# Patient Record
Sex: Female | Born: 1940 | Race: White | Hispanic: No | Smoking: Former smoker
Health system: Southern US, Community
[De-identification: ages and names within clinical notes are randomized; demographics above are authoritative.]

## PROBLEM LIST (undated history)

## (undated) DIAGNOSIS — G894 Chronic pain syndrome: Secondary | ICD-10-CM

## (undated) DIAGNOSIS — F0390 Unspecified dementia without behavioral disturbance: Secondary | ICD-10-CM

## (undated) DIAGNOSIS — I471 Supraventricular tachycardia: Secondary | ICD-10-CM

## (undated) DIAGNOSIS — E785 Hyperlipidemia, unspecified: Secondary | ICD-10-CM

## (undated) DIAGNOSIS — M549 Dorsalgia, unspecified: Secondary | ICD-10-CM

## (undated) DIAGNOSIS — Q233 Congenital mitral insufficiency: Secondary | ICD-10-CM

## (undated) DIAGNOSIS — E039 Hypothyroidism, unspecified: Secondary | ICD-10-CM

## (undated) DIAGNOSIS — G4730 Sleep apnea, unspecified: Secondary | ICD-10-CM

## (undated) DIAGNOSIS — I2510 Atherosclerotic heart disease of native coronary artery without angina pectoris: Secondary | ICD-10-CM

## (undated) DIAGNOSIS — M797 Fibromyalgia: Secondary | ICD-10-CM

## (undated) HISTORY — DX: Fibromyalgia: M79.7

## (undated) HISTORY — DX: Sleep apnea, unspecified: G47.30

## (undated) HISTORY — DX: Dorsalgia, unspecified: M54.9

## (undated) HISTORY — DX: Hypothyroidism, unspecified: E03.9

## (undated) HISTORY — PX: TUBAL LIGATION: SHX77

## (undated) HISTORY — DX: Hyperlipidemia, unspecified: E78.5

## (undated) HISTORY — DX: Atherosclerotic heart disease of native coronary artery without angina pectoris: I25.10

## (undated) HISTORY — DX: Chronic pain syndrome: G89.4

## (undated) HISTORY — DX: Morbid (severe) obesity due to excess calories: E66.01

## (undated) HISTORY — DX: Supraventricular tachycardia: I47.1

## (undated) HISTORY — DX: Congenital mitral insufficiency: Q23.3

## (undated) HISTORY — DX: Unspecified dementia, unspecified severity, without behavioral disturbance, psychotic disturbance, mood disturbance, and anxiety: F03.90

## (undated) HISTORY — PX: TOTAL ABDOMINAL HYSTERECTOMY: SHX209

## (undated) HISTORY — PX: APPENDECTOMY: SHX54

---

## 1998-02-20 ENCOUNTER — Ambulatory Visit (HOSPITAL_COMMUNITY): Admission: RE | Admit: 1998-02-20 | Discharge: 1998-02-20 | Payer: Self-pay

## 1999-09-08 ENCOUNTER — Other Ambulatory Visit: Admission: RE | Admit: 1999-09-08 | Discharge: 1999-09-08 | Payer: Self-pay | Admitting: Family Medicine

## 2000-03-04 ENCOUNTER — Ambulatory Visit (HOSPITAL_BASED_OUTPATIENT_CLINIC_OR_DEPARTMENT_OTHER): Admission: RE | Admit: 2000-03-04 | Discharge: 2000-03-04 | Payer: Self-pay | Admitting: Family Medicine

## 2000-08-09 ENCOUNTER — Encounter: Admission: RE | Admit: 2000-08-09 | Discharge: 2000-11-07 | Payer: Self-pay | Admitting: Family Medicine

## 2000-11-11 ENCOUNTER — Encounter: Admission: RE | Admit: 2000-11-11 | Discharge: 2001-02-09 | Payer: Self-pay | Admitting: Family Medicine

## 2001-04-18 ENCOUNTER — Emergency Department (HOSPITAL_COMMUNITY): Admission: EM | Admit: 2001-04-18 | Discharge: 2001-04-18 | Payer: Self-pay | Admitting: Emergency Medicine

## 2001-11-10 ENCOUNTER — Ambulatory Visit (HOSPITAL_COMMUNITY): Admission: RE | Admit: 2001-11-10 | Discharge: 2001-11-10 | Payer: Self-pay | Admitting: Cardiology

## 2002-08-04 ENCOUNTER — Ambulatory Visit (HOSPITAL_BASED_OUTPATIENT_CLINIC_OR_DEPARTMENT_OTHER): Admission: RE | Admit: 2002-08-04 | Discharge: 2002-08-04 | Payer: Self-pay | Admitting: *Deleted

## 2003-11-05 ENCOUNTER — Encounter: Admission: RE | Admit: 2003-11-05 | Discharge: 2003-12-12 | Payer: Self-pay | Admitting: Family Medicine

## 2004-08-21 ENCOUNTER — Encounter: Admission: RE | Admit: 2004-08-21 | Discharge: 2004-11-19 | Payer: Self-pay | Admitting: Family Medicine

## 2004-12-01 ENCOUNTER — Encounter: Admission: RE | Admit: 2004-12-01 | Discharge: 2005-03-01 | Payer: Self-pay | Admitting: Family Medicine

## 2005-02-26 ENCOUNTER — Encounter: Admission: RE | Admit: 2005-02-26 | Discharge: 2005-05-27 | Payer: Self-pay | Admitting: Family Medicine

## 2005-06-11 ENCOUNTER — Encounter: Admission: RE | Admit: 2005-06-11 | Discharge: 2005-07-12 | Payer: Self-pay | Admitting: Family Medicine

## 2005-07-23 ENCOUNTER — Encounter: Admission: RE | Admit: 2005-07-23 | Discharge: 2005-10-21 | Payer: Self-pay | Admitting: Family Medicine

## 2005-08-17 ENCOUNTER — Encounter
Admission: RE | Admit: 2005-08-17 | Discharge: 2005-08-17 | Payer: Self-pay | Admitting: Physical Medicine and Rehabilitation

## 2006-07-21 ENCOUNTER — Encounter: Admission: RE | Admit: 2006-07-21 | Discharge: 2006-07-21 | Payer: Self-pay | Admitting: Family Medicine

## 2006-08-18 ENCOUNTER — Ambulatory Visit: Payer: Self-pay | Admitting: Internal Medicine

## 2006-10-11 ENCOUNTER — Ambulatory Visit: Payer: Self-pay | Admitting: Internal Medicine

## 2006-10-11 ENCOUNTER — Ambulatory Visit: Payer: Self-pay | Admitting: Cardiovascular Disease

## 2006-12-21 ENCOUNTER — Emergency Department (HOSPITAL_COMMUNITY): Admission: EM | Admit: 2006-12-21 | Discharge: 2006-12-21 | Payer: Self-pay | Admitting: Emergency Medicine

## 2007-05-03 ENCOUNTER — Encounter: Admission: RE | Admit: 2007-05-03 | Discharge: 2007-05-03 | Payer: Self-pay | Admitting: Gastroenterology

## 2007-09-06 ENCOUNTER — Ambulatory Visit (HOSPITAL_BASED_OUTPATIENT_CLINIC_OR_DEPARTMENT_OTHER): Admission: RE | Admit: 2007-09-06 | Discharge: 2007-09-06 | Payer: Self-pay | Admitting: Urology

## 2008-04-12 ENCOUNTER — Ambulatory Visit: Payer: Self-pay | Admitting: Internal Medicine

## 2008-04-12 DIAGNOSIS — G2581 Restless legs syndrome: Secondary | ICD-10-CM | POA: Insufficient documentation

## 2008-04-12 DIAGNOSIS — G4710 Hypersomnia, unspecified: Secondary | ICD-10-CM | POA: Insufficient documentation

## 2008-04-16 ENCOUNTER — Telehealth (INDEPENDENT_AMBULATORY_CARE_PROVIDER_SITE_OTHER): Payer: Self-pay | Admitting: *Deleted

## 2008-04-22 DIAGNOSIS — E039 Hypothyroidism, unspecified: Secondary | ICD-10-CM

## 2008-04-22 DIAGNOSIS — J309 Allergic rhinitis, unspecified: Secondary | ICD-10-CM | POA: Insufficient documentation

## 2008-04-22 DIAGNOSIS — R05 Cough: Secondary | ICD-10-CM

## 2008-04-22 DIAGNOSIS — E119 Type 2 diabetes mellitus without complications: Secondary | ICD-10-CM

## 2008-04-22 DIAGNOSIS — G4730 Sleep apnea, unspecified: Secondary | ICD-10-CM | POA: Insufficient documentation

## 2008-05-03 ENCOUNTER — Telehealth (INDEPENDENT_AMBULATORY_CARE_PROVIDER_SITE_OTHER): Payer: Self-pay | Admitting: *Deleted

## 2008-05-07 ENCOUNTER — Encounter: Payer: Self-pay | Admitting: Internal Medicine

## 2008-05-09 ENCOUNTER — Telehealth (INDEPENDENT_AMBULATORY_CARE_PROVIDER_SITE_OTHER): Payer: Self-pay | Admitting: *Deleted

## 2008-05-22 ENCOUNTER — Ambulatory Visit: Payer: Self-pay | Admitting: Internal Medicine

## 2008-06-02 ENCOUNTER — Inpatient Hospital Stay (HOSPITAL_COMMUNITY): Admission: EM | Admit: 2008-06-02 | Discharge: 2008-06-05 | Payer: Self-pay | Admitting: Emergency Medicine

## 2008-06-02 ENCOUNTER — Ambulatory Visit: Payer: Self-pay | Admitting: Cardiology

## 2008-06-04 ENCOUNTER — Encounter (INDEPENDENT_AMBULATORY_CARE_PROVIDER_SITE_OTHER): Payer: Self-pay | Admitting: Internal Medicine

## 2008-06-05 ENCOUNTER — Telehealth: Payer: Self-pay | Admitting: Internal Medicine

## 2008-06-05 ENCOUNTER — Encounter: Payer: Self-pay | Admitting: Internal Medicine

## 2008-07-11 ENCOUNTER — Ambulatory Visit: Payer: Self-pay | Admitting: Cardiology

## 2008-10-16 ENCOUNTER — Telehealth (INDEPENDENT_AMBULATORY_CARE_PROVIDER_SITE_OTHER): Payer: Self-pay | Admitting: *Deleted

## 2010-04-07 ENCOUNTER — Ambulatory Visit: Payer: Self-pay | Admitting: Cardiology

## 2010-06-04 ENCOUNTER — Ambulatory Visit: Payer: Self-pay | Admitting: Cardiology

## 2010-06-04 DIAGNOSIS — I471 Supraventricular tachycardia, unspecified: Secondary | ICD-10-CM | POA: Insufficient documentation

## 2010-08-02 ENCOUNTER — Encounter: Payer: Self-pay | Admitting: Family Medicine

## 2010-08-03 ENCOUNTER — Encounter: Payer: Self-pay | Admitting: Gastroenterology

## 2010-08-12 NOTE — Assessment & Plan Note (Signed)
Summary: [facility]  Medications Added SOTALOL HCL 80 MG TABS (SOTALOL HCL) 1 1/2 by mouth two times a day COUMADIN 5 MG TABS (WARFARIN SODIUM) UAD LANTUS 100 UNIT/ML SOLN (INSULIN GLARGINE) UAD FLONASE 50 MCG/ACT SUSP (FLUTICASONE PROPIONATE) as directed FUROSEMIDE 40 MG TABS (FUROSEMIDE) 1 by mouth daily LEVOTHROID 75 MCG TABS (LEVOTHYROXINE SODIUM) 1 by mouth daily      Allergies Added:   Visit Type:  Follow-up Primary Provider:  Rudi Heap  CC:  SVT/Atrial Fib.  History of Present Illness: The patient presents for evaluation of the above. She is a complicated patient who saw Dr. Shirlee Latch in 2009. She was hospitalized and had a cardiac catheterization demonstrating nonobstructive coronary disease. At that time she did have a non-Q-wave MI felt to be related to supraventricular arrhythmia. Since that time she has been seen at [facility]. I reviewed these records. Discharge notes report atrial fibrillation and supraventricular tachycardia. They had considered an ablation but because of her morbid obesity decided to treat her with Betapace. Since that time the patient has reported no tachycardia palpitations. She was sent home on oxygen from January hospitalization 2011.  This was because she was found to have a resting oxygen saturations in the high 80s. Since that time she says she's had no followup on this.  She seems quite unhappy about being on sotalol and oxygen. She is on a walker. She is limited by severe morbid obesity. She does not describe palpitations, presyncope or syncope. She does not describe chest pressure, neck or arm discomfort. She does not describe PND or orthopnea. She has a history of sleep apnea but has not been using CPAP for years. She wonders if she still has sleep apnea. She has been upset over the left followup. However, when I review the previous schedule appointments I see that  in 2010 she canceled several appointments with Dr. Maple Hudson.  Current  Medications (verified): 1)  Cymbalta 60 Mg Cpep (Duloxetine Hcl) .... Take 1 By Mouth Once Daily 2)  Humalog 100 Unit/ml Soln (Insulin Lispro (Human)) .... Sliding Scale 3)  Klonopin 0.5 Mg Tabs (Clonazepam) .... Take 2 Tablets At Bedtime and As Needed During Day 4)  Hydrocodone-Acetaminophen 5-500 Mg Tabs (Hydrocodone-Acetaminophen) .... Take 1 By Mouth Every 6 Hours As Needed 5)  Pain Block Inejctions .... Dr. Ethelene Hal- Every 2-3 Months 6)  Vitamin D 1000 Unit Caps (Cholecalciferol) .... Take 1 By Mouth Once Daily 7)  Sotalol Hcl 80 Mg Tabs (Sotalol Hcl) .Marland Kitchen.. 1 1/2 By Mouth Two Times A Day 8)  Cpap   Advanced 9)  Coumadin 5 Mg Tabs (Warfarin Sodium) .... Uad 10)  Lantus 100 Unit/ml Soln (Insulin Glargine) .... Uad 11)  Flonase 50 Mcg/act Susp (Fluticasone Propionate) .... As Directed 12)  Furosemide 40 Mg Tabs (Furosemide) .Marland Kitchen.. 1 By Mouth Daily 13)  Levothroid 75 Mcg Tabs (Levothyroxine Sodium) .Marland Kitchen.. 1 By Mouth Daily  Allergies (verified): 1)  ! Pcn 2)  ! Codeine  Past History:  Past Medical History: Sleep Apnea Morbid obesity Allergic Rhinitis Diabetes, Type 2 back pain- injections -Dr/ Ramos Hypothyroidism CAD Hyperlipidemia Fibromyalgia Narcolepsy SVT/Atrial fibrillaiton MR-mild  Past Surgical History: Total Abdominal Hysterectomy Ttubal ligation Appendectomy  Review of Systems       As stated in the HPI and negative for all other systems.   Vital Signs:  Patient profile:   70 year old female Height:      64 inches Weight:      304 pounds BMI:  52.37 Pulse rate:   69 / minute Resp:     18 per minute BP sitting:   128 / 64  (right arm)  Vitals Entered By: Marrion Coy, CNA (June 04, 2010 2:13 PM)  Physical Exam  General:  obese.   Head:  normocephalic and atraumatic Eyes:  PERRLA/EOM intact; conjunctiva and lids normal. Neck:  No bruits, the patient is examined seated at 90 and I could not appreciate jugular venous distention or other  abnormalities but her morbid obesity compromises the exam Lungs:  Clear bilaterally to auscultation and percussion. Heart:  Distant heart sounds, S1 and S2 within normal limits, no obvious murmurs rubs or gallops Abdomen:  large pannus with normal bowel sounds, no rebound or guarding, it would be impossible to assess organomegaly no midline pulsatile mass or bruit Msk:  Back normal, normal gait. Muscle strength and tone normal. Extremities:  Chronic venous stasis changes, no edema, 2+ pulses Neurologic:  Alert and oriented x 3. Skin:  Intact without lesions or rashes. Cervical Nodes:  no significant adenopathy Psych:  depressed affect.     EKG  Procedure date:  06/04/2010  Findings:      sinus rhythm, rate 69, axis within normal limits, short PR interval, no acute ST-T wave changes  Impression & Recommendations:  Problem # 1:  SUPRAVENTRICULAR TACHYCARDIA (ICD-427.89) I reviewed extensively the records from [facility]. The patient was quite upset on sotalol. However, I spent quite a bit of time discussing with her the rationale and locking of his therapy and that he seems to be working. For now he'll change in therapy is indicated.  Problem # 2:  SLEEP APNEA (ICD-780.57) She had some distress wearing oxygen. All of oxygen ambulating her sats were in the 90s. However, I suggested that she should discuss this with Dr. Maple Hudson to see if she really needs this. We also discussed at length sleep apnea as fatigue is a significant complaint and she's not wearing CPAP. She doesn't think she has sleep apnea and a longer period I have ordered another sleep study and she agrees to follow up with Dr. Maple Hudson though she was previously non compliant with these appts. Orders: Sleep Disorder Referral (Sleep Disorder)  Problem # 3:  MORBID OBESITY (ICD-278.01) Certainly her major health problems morbid obesity. In one point there was some discussion apparently the bariatric surgery. I would encourage her  primary providers who must know her better to continue this conversation.  Patient Instructions: 1)  Your physician recommends that you schedule a follow-up appointment in: 1 YR WITH DR [facility] 2)  Your physician recommends that you continue on your current medications as directed. Please refer to the Current Medication list given to you today. 3)  Your physician has recommended that you have a sleep study.  This test records several body functions during sleep, including:  brain activity, eye movement, oxygen and carbon dioxide blood levels, heart rate and rhythm, breathing rate and rhythm, the flow of air through your mouth and nose, snoring, body muscle movements, and chest and belly movement.

## 2010-09-27 ENCOUNTER — Emergency Department (HOSPITAL_COMMUNITY)
Admission: EM | Admit: 2010-09-27 | Discharge: 2010-09-28 | Disposition: A | Discharge status: Home / Self Care | Payer: Medicare Other | Attending: Emergency Medicine | Admitting: Emergency Medicine

## 2010-09-27 DIAGNOSIS — J449 Chronic obstructive pulmonary disease, unspecified: Secondary | ICD-10-CM | POA: Insufficient documentation

## 2010-09-27 DIAGNOSIS — J4489 Other specified chronic obstructive pulmonary disease: Secondary | ICD-10-CM | POA: Insufficient documentation

## 2010-09-27 DIAGNOSIS — Z794 Long term (current) use of insulin: Secondary | ICD-10-CM | POA: Insufficient documentation

## 2010-09-27 DIAGNOSIS — L02219 Cutaneous abscess of trunk, unspecified: Secondary | ICD-10-CM | POA: Insufficient documentation

## 2010-09-27 DIAGNOSIS — I498 Other specified cardiac arrhythmias: Secondary | ICD-10-CM | POA: Insufficient documentation

## 2010-09-27 DIAGNOSIS — L03319 Cellulitis of trunk, unspecified: Secondary | ICD-10-CM | POA: Insufficient documentation

## 2010-09-27 DIAGNOSIS — I10 Essential (primary) hypertension: Secondary | ICD-10-CM | POA: Insufficient documentation

## 2010-09-27 DIAGNOSIS — B372 Candidiasis of skin and nail: Secondary | ICD-10-CM | POA: Insufficient documentation

## 2010-09-27 DIAGNOSIS — E119 Type 2 diabetes mellitus without complications: Secondary | ICD-10-CM | POA: Insufficient documentation

## 2010-09-27 DIAGNOSIS — D500 Iron deficiency anemia secondary to blood loss (chronic): Secondary | ICD-10-CM | POA: Insufficient documentation

## 2010-09-27 DIAGNOSIS — G4733 Obstructive sleep apnea (adult) (pediatric): Secondary | ICD-10-CM | POA: Insufficient documentation

## 2010-09-27 LAB — POCT CARDIAC MARKERS
Myoglobin, poc: 47.3 ng/mL (ref 12–200)
Troponin i, poc: <0.05 ng/mL (ref 0.00–0.09)

## 2010-09-27 LAB — POCT I-STAT, CHEM 8
BUN: 24 mg/dL — ABNORMAL HIGH (ref 6–23)
Chloride: 103 mEq/L (ref 96–112)
Glucose, Bld: 233 mg/dL — ABNORMAL HIGH (ref 70–99)
Potassium: 3.9 mEq/L (ref 3.5–5.1)

## 2010-09-27 LAB — PROTIME-INR: INR: 2.1 — ABNORMAL HIGH (ref 0.00–1.49)

## 2010-09-28 LAB — POCT CARDIAC MARKERS
Myoglobin, poc: 54.6 ng/mL (ref 12–200)
Troponin i, poc: <0.05 ng/mL (ref 0.00–0.09)

## 2010-10-01 LAB — CULTURE, ROUTINE-ABSCESS

## 2010-11-06 ENCOUNTER — Ambulatory Visit: Payer: Medicare Other | Admitting: Cardiology

## 2010-11-25 ENCOUNTER — Encounter: Payer: Self-pay | Admitting: Cardiology

## 2010-11-25 NOTE — Op Note (Signed)
NAMESIMRIT, GOHLKE             ACCOUNT NO.:  [id number]   MEDICAL RECORD NO.:  [id number]          PATIENT TYPE:  AMB   LOCATION:  [facility]                         FACILITY:  [facility]   PHYSICIAN:  Excell Seltzer. Annabell Howells, M.D.    DATE OF BIRTH:  01-05-1941   DATE OF PROCEDURE:  09/06/2007  DATE OF DISCHARGE:                               OPERATIVE REPORT   PROCEDURE:  Cystoscopy with Contigen implant.   PREOPERATIVE DIAGNOSIS:  Stress incontinence.   POSTOPERATIVE DIAGNOSIS:  Stress incontinence.   SURGEON:  Excell Seltzer. Annabell Howells, M.D.   ANESTHESIA:  MAC and local.   COMPLICATIONS:  None.   INDICATIONS:  Kynsley is a 70 year old morbidly obese white female with  stress incontinence who is going to have a collagen injection.   FINDINGS AND PROCEDURE:  She was taken to the operating room after  receiving Cipro.  She was placed on the table in the lithotomy position.  Light sedation was induced.  Her perineum and genitalia were prepped  with Betadine solution.  She was draped in the usual sterile fashion.  The 22-French scope was passed.  This was fitted with a 12 degrees lens  stabilization catheter and transurethral needle. Inspection of the  bladder did demonstrate follicular cystitis type changes which were  noted on her prior cystoscopy.  Ureteral orifices were unremarkable.  No  tumors were seen.  The urethra was somewhat patulous. The proximal  urethra was infiltrated with 1.5 mL of 1% lidocaine on each side.  This  was then followed by a submucosal implant of 2.5 mL of Contigen on each  side with excellent mucosal bulging and coaptation. The bladder was  drained with a 16-French red rubber catheter.  The patient was taken  down from the lithotomy position and was moved to the recovery room in  stable condition.  There were no complications.      Excell Seltzer. Annabell Howells, M.D.  Electronically Signed     JJW/MEDQ  D:  09/06/2007  T:  09/06/2007  Job:  [id number]   cc:   Ernestina Penna, M.D.  Fax:  [phone]

## 2010-11-25 NOTE — Consult Note (Signed)
[name], [name]             ACCOUNT NO.:  [id number]   MEDICAL RECORD NO.:  [id number]          PATIENT TYPE:  INP   LOCATION:  3728                         FACILITY:  [facility]   PHYSICIAN:  Madolyn Frieze. Jens Som, MD, FACCDATE OF BIRTH:  Aug 05, 1940   DATE OF CONSULTATION:  06/02/2008  DATE OF DISCHARGE:                                 CONSULTATION   [name] is a 70 year old female with past medical history of  morbid obesity, diabetes, hyperlipidemia, fibromyalgia, obstructive  sleep apnea, hypothyroidism, narcolepsy who I am asked to evaluate for  supraventricular tachycardia and chest pain.  The patient apparently had  seen Dr. Antoine Poche several years ago, but I did not have those records  available.  A cardiac catheterization was recommended, but she states I  check in doubt.  She is not able to do a significant amount of  activity at home.  This is because of back pain and obesity.  She does  walk from her bedroom to her kitchen but is otherwise confined to a  wheelchair.  She does have chronic dyspnea on exertion, but there is no  pedal edema.  There is no orthopnea or PND, although she has been  diagnosed with sleep apnea and narcolepsy.  She does not have exertional  chest pain.  At approximately 1 o'clock this morning, the patient was  lying on her side and developed sudden onset of palpitations.  This was  described as her heart racing.  She then developed a burning pain in  her chest that radiated to both upper extremities.  There was associated  shortness of breath, but there was no nausea, vomiting, or diaphoresis.  The pain was not pleuritic, positional, nor it was related to food.  They persisted and EMS was called.  An electrocardiogram was obtained  and revealed supraventricular ventricular tachycardia at a rate of 210.  She was given Cardizem and converted to sinus rhythm.  She is now  asymptomatic.  Cardiology is asked to further evaluate.   Her medications  include trazodone 150 mg p.o. nightly, Klonopin 0.5 mg,  Cymbalta, levothyroxine, lisinopril, Januvia, and insulin.  She was also  recently started on Ritalin approximately 5 weeks ago for her  narcolepsy.   She has an allergy to PENICILLIN and CODEINE.   SOCIAL HISTORY:  She does not smoke and has not for 40 years.  She does  not consume alcohol.  She has 3 children.   Her family history is significant for her father who apparently died of  myocardial infarction at age 60.   Her past medical history is significant for diabetes mellitus and  hyperlipidemia, but there is no hypertension.  She does have a history  of obstructive sleep apnea as well as narcolepsy.  There is a history of  COPD.  She also has a history of depression.  She has a history of  multiple abdominal surgeries including cholecystectomy, appendectomy,  and hysterectomy.  She has had previous surgery for what sounds to be a  pelvic inflammatory disease.  She has also had an ovarian cyst.  She has  had a  previous tonsillectomy.  She has also had bilateral carpal tunnel  surgery.  There is also a history of fibromyalgia by report.   REVIEW OF SYSTEMS:  She denies any headaches or fevers or chills.  She  has had a chronic cough that is nonproductive.  There is no hemoptysis.  There is no dysphagia, odynophagia, or melena.  She occasionally has  hematochezia that she attributes to a hemorrhoid.  There is no dysuria  or hematuria.  There are no rashes or seizure activity.  There is no  orthopnea, PND, and there is no pedal edema.  She does have chronic back  pain.  Remaining systems are negative.   Her physical examination today shows a blood pressure initially of  160/67 with a followup of 121/59.  Her temperature is 98.  Her pulse is  87.  She is well developed and morbidly obese.  She is in no acute  distress at present.  Her skin is warm and dry.  She does not appear to  be depressed.  There is no peripheral  clubbing.  Her back is unable to  be assessed.  Her HEENT is normal with normal eyelids.  Her neck is  supple with a normal upstroke bilaterally.  There are no bruits noted  and I cannot appreciate jugular venous distention, although the exam is  difficult.  Her chest shows diminished breath sounds throughout.  Her  cardiovascular exam reveals a regular rate and rhythm with a normal S1  and S2.  Her heart sounds are distant, but I can appreciate no murmurs,  rubs, or gallops.  Abdominal exam is significant for massive obesity.  She also is status post abdominal surgery.  There is no tenderness to  palpation nor she distended.  I cannot appreciate hepatosplenomegaly or  masses, but again this is a very difficult exam.  Her femoral pulses are  not palpated.  Her extremities show no edema and I can palpate no cords.  Her distal pulses are diminished.  Her neurologic exam is grossly  intact.   Her initial electrocardiogram showed SVT at a rate of 210 with  inferolateral ST depression.  A followup showed normal sinus rhythm with  nonspecific ST changes.   Her Troponin I is 0.09.  Her BUN and creatinine is 15 and 0.7  respectively with a hemoglobin of 14.3.   DIAGNOSES:  1. Supraventricular tachycardia - The patient has documented      supraventricular tachycardia with a rapid ventricular response.      She is now converted into sinus rhythm.  We will add low-dose beta-      blockade and increase as tolerated.  If she has recurrent episodes      in the future, then we can refer to our electrophysiologist for      consideration of ablation.  2. Chest pain - Her symptoms are concerning and she has multiple risk      factors including approximately 15 years of diabetes, strong family      history, and hypertension.  The risk and benefits of cardiac      catheterization have been discussed and she agrees to proceed.  We      would recommend adding an aspirin for now.  She also needs DVT       prophylaxis, Lovenox.  We will continue to cycle enzymes.  3. Morbid obesity.  4. Chronic back pain.  5. Diabetes mellitus - Managed per the Primary Care Service.  6. Hyperlipidemia.  7. Obstructive sleep apnea.  8. Hypothyroidism - We would recommend checking TSH.   Note, we would also recommend discontinuing her Ritalin.  We will be  happy to follow her while she is in the hospital.      Madolyn Frieze. Jens Som, MD, [facility]  Electronically Signed     BSC/MEDQ  D:  06/02/2008  T:  06/02/2008  Job:  [id number]

## 2010-11-25 NOTE — Assessment & Plan Note (Signed)
[name]                            CARDIOLOGY OFFICE NOTE   [place], [place]                    MRN:          [id number]  DATE:07/11/2008                            DOB:          February 12, 1941    PRIMARY CARE PHYSICIAN:  [place] Penna, MD, [facility].   PULMONOLOGIST:  [name]. Maple Hudson, MD, FCCP, FACP.   HISTORY OF PRESENT ILLNESS:  This is a 70 year old woman with a history  of coronary artery disease, hypertension, morbid obesity, diabetes, and  hyperlipidemia, who presents to Cardiology Clinic for followup after  recent hospitalization.  The patient was admitted on June 02, 2008,  with supraventricular tachycardia that was associated with chest burning  and discomfort.  Her supraventricular tachycardia spontaneously  converted to sinus rhythm and she had no recurrences while in the  hospital.  She did end up having a left heart catheterization, which  revealed moderate coronary artery disease.  We did stop her  methylphenidate which she was on for narcolepsy at the time of her  hospitalization given the episode of symptomatic SVT.  Since discharge,  the patient has been doing reasonably well.  She is wheelchair bound at  baseline.  She is able to walk from her bed to the kitchen; however, she  gets quite short of breath doing that.  This has been a pattern that has  been present for a long time.  She says she occasionally gets short of  breath first thing in the morning, when she gets up and sits on the side  of the bed, this will resolve after a few minutes and she is able to go  about her day.  She has had no further episodes of palpitations or heart  racing.  She has had no episodes of chest pain or burning since  discharge.  She has been on metoprolol twice a day to try to help  suppress the SVT.  Of note, her blood sugars have been running quite  high, they run greater than 200 in the morning.  She has  been seeing an  endocrinologist in [name] and finally, the patient states she had  a cough with ACE inhibitor in the past and she was actually discharged  from the hospital on lisinopril.  She states that she has had a mild  chronic cough come back.  She has been on valsartan in the past with no  problems.  Also, the patient is intolerant to multiple statins including  Lipitor, Zocor, and Crestor.  We started her on fluvastatin in the  hospital given her elevated LDL.  She states she has tolerated that  without problems.   PAST MEDICAL HISTORY:  1. Morbid obesity.  2. Diabetes.  3. Chronic low back pain.  4. Hyperlipidemia.  5. Obstructive sleep apnea.  The patient uses CPAP.  6. Fibromyalgia.  7. Hypothyroidism.  8. Narcolepsy.  The patient was on methylphenidate when she developed      her SVT.  We did stop that while she was in the hospital.  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  This was first manifested in      November 2009.  No recurrence so far.  10.Coronary artery disease.  The patient presented with unstable      angina in the setting of her SVT.  She had a left heart      catheterization in November 2009, that showed a 57% proximal      circumflex stenosis, 70% mid circumflex stenosis, 50% proximal LAD      stenosis, and EF on left ventriculogram was 65%.  It was decided to      medically manage the patient.  As his chest pain was thought to be      due to demand ischemia in the setting of fixed coronary stenosis      and fast heart rate.  11.Transthoracic echocardiogram in November 2009, this showed EF of 60-      65% with mild left atrial enlargement and mild mitral      regurgitation.   Most recent labs in November 2009, LDL 171, triglyceride 160, and HDL  24.  Creatinine 0.6.   SOCIAL HISTORY:  The patient lives in [name]  She quit smoking  40 years ago.  She does not drink alcohol.  She is married.  She has 3  children.   FAMILY HISTORY:  The  patient's father had an MI at the age of 78.   REVIEW OF SYSTEMS:  Negative except as noted in the history of present  illness.   ALLERGIES:  PENICILLIN and CODEINE intolerances.  STATINS included  LIPITOR, ZOCOR, and CRESTOR have all made the patient feel weak.   PHYSICAL EXAMINATION:  VITAL SIGNS:  Blood pressure 115/66 and heart  rate is 97 and regular.  Weight is 326 pounds.  GENERAL:  This is obese female in no apparent distress.  NEUROLOGIC:  Alert and oriented x3.  Normal affect.  NECK:  There is no JVD.  There is no thyromegaly or thyroid nodule.  CARDIOVASCULAR:  Heart regular.  S1 and S2.  No S3.  No S4.  No murmur.  There is no peripheral edema.  There are 2+ posterior tibial pulses  bilaterally.  There is no carotid bruit.  LUNGS:  Clear to auscultation bilaterally with normal respiratory  effort.  ABDOMEN:  Significant obesity.  Soft and nontender.  Bowel sounds are  present.  SKIN:  Normal.  HEENT:  Normal.  MUSCULOSKELETAL:  Normal.  EXTREMITIES:  No clubbing or cyanosis.   ASSESSMENT AND PLAN:  This is a 70 year old with a history of coronary  artery disease, diabetes, hyperlipidemia, and morbid obesity who  presents to Cardiology Clinic for followup of hospitalization.  1. Coronary artery disease.  The patient was noted to have moderate      coronary artery disease.  While she was in the hospital, she had an      episode of chest pain in the setting of SVT, this is likely demand      ischemia.  We will continue to medically manage this.  She has had      no further chest pain.  She is on aspirin, beta-blocker, statin,      and ACE inhibitor.  2. Supraventricular tachycardia.  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      This was associated with the use of methylphenidate.  We have      stopped the methylphenidate.  The patient has felt no recurrences      since discharge from the hospital.  She  is on beta-blocker.  If she      were to have a recurrence, we could  consider an AVNRT ablation.  3. Hyperlipidemia.  The patient's cholesterol is quite high with an      LDL of 171 and HDL cholesterol is low at 24.  She has been      intolerant on multiple statins.  We did start her on fluvastatin 40      mg in the hospital.  We are going to increase it to 80 mg today.      We will check her lipids in about 2-3 months.  I did want her to be      on Zetia also; however, she does not want to take that.  We did      talk a little about Niaspan, but I would be a little bit worried      about that while her glucose is still inadequately controlled.      Once her sugar gets under better control, we could consider adding      Niaspan as she is far from her goal for both LDL and HDL      cholesterol in the setting of quite significant coronary artery      disease.  4. Diabetes.  The patient's diabetes continued to be poorly controlled      with her sugar greater than 200.  She is followed up by an      endocrinologist in [place] who has been titrating her      diabetic regimen.  5. We did talk in length to the patient about attempting to loose some      weight.  She has been considering lap-band surgery, which I think      would be quite a reasonable thing for her to do.  6. If she does have a history of ACE inhibitor cough, we will      discontinue her lisinopril today and have her on valsartan 40 mg      b.i.d. instead.     [name], MD  Electronically Signed    DM/MedQ  DD: 07/11/2008  DT: 07/12/2008  Job #: [id number]   cc:   Joni Fears D. Maple Hudson, MD, FCCP, FACP  [name], M.D.

## 2010-11-25 NOTE — H&P (Signed)
NAME:  [name], [name]             ACCOUNT NO.:  [id number]   MEDICAL RECORD NO.:  [id number]          PATIENT TYPE:  EMS   LOCATION:  MAJO                         FACILITY:  [facility]   PHYSICIAN:  Wilson Singer, M.D.DATE OF BIRTH:  1941-07-05   DATE OF ADMISSION:  06/02/2008  DATE OF DISCHARGE:                              HISTORY & PHYSICAL   HISTORY:  This is a 70 year old morbidly obese lady who at 1:30 a.m.  this morning was sitting at her home watching television and had a  sudden onset of burning, pushing chest discomfort that spread across the  whole of her chest and down both her arms.  The episode she believes  lasted approximately 30 minutes during which time she had called  emergency services and after they attended to her, she seemed to improve  with her symptoms.  When she presented to the emergency room, she was  apparently in a rhythm of atrial flutter and Cardizem bolus 25 mg was  given and then she was placed on a drip of Cardizem at 10 mg an hour.  When I saw her in the emergency room at 7:30 this morning, the Cardizem  drip has been discontinued and she appears to have reverted back a  normal sinus rhythm.  Her symptoms are no longer present.  She does  describe shortness of breath on exertion most days, but her mobility is  limited because of her morbid obesity.  She was started on  methylphenidate for narcolepsy approximately 5 weeks ago and she wonders  whether this is a cause of her symptoms.  She denies any cough, fever,  hemoptysis, or persistent chest pain at the present time.   PAST MEDICAL HISTORY:  1. Significant for diabetes type 2, insulin-dependent.  2. Hypertension.  3. Obstructive sleep apnea with narcolepsy.  4. Morbid obesity.  5. Depression.  6. COPD.  7. Degenerative joint disease.  8. Depression.  9. She also has hypothyroidism.   PAST SURGICAL HISTORY:  Significant for a cystoscopy with Contigen  implant in February 2009.   SOCIAL  HISTORY:  She has been married for 40 years.  She does not smoke  and does not drink alcohol.  She has been on disability since 1993.   FAMILY HISTORY:  Noncontributory.   ALLERGIES:  CODEINE and PENICILLIN.   CURRENT MEDICATIONS:  1. Trazodone 150 mg nightly.  2. Klonopin 0.5 mg daily.  3. Cymbalta 90 mg daily.  4. Levothyroxine 50 mcg daily.  5. Lisinopril 10 mg daily.  6. Januvia 100 mg daily.  7. Humalog insulin 75/25 mixture 50 units b.i.d.   REVIEW OF SYSTEMS:  Apart from the symptoms mentioned above, there are  no other symptoms referable to all systems reviewed.   PHYSICAL EXAMINATION:  GENERAL:  The patient is currently afebrile and  hemodynamically stable.  VITAL SIGNS:  Temperature 98.6, blood pressure 137/44, pulse 75-80 and  appears to be in sinus rhythm, and saturations 97%.  CARDIOVASCULAR:  Heart sounds are present and normal without any  murmurs.  RESPIRATORY:  Lungs fields are clear.  There are no pleural or  pericardial rub.  ABDOMEN:  Soft and nontender.  She is very obese and difficult to  palpate her abdomen.  NEUROLOGIC:  She is alert and oriented with no focal neurological signs.   INVESTIGATIONS:  Sodium 140, potassium 4.0, chloride 105, BUN 15,  creatinine 0.7, glucose 294, hemoglobin 14.3, and troponin first one is  less than 0.05.   IMPRESSION:  1. Chest pain, unclear etiology, need to rule out myocardial      infarction or ischemia.  2. Atrial flutter or atrial fibrillation with rapid ventricular      response, now appears to be in sinus rhythm.  3. Morbid obesity.  4. Type 2 diabetes mellitus and now insulin dependent.  5. Hypertension.  6. Sleep apnea with narcolepsy, recently started on methylphenidate 5      weeks ago.  7. Depression.  8. Chronic obstructive pulmonary disease.  9. Hypothyroidism.   PLAN:  1. Admit to telemetry.  2. Serial cardiac enzymes and ECG.  3. Check echocardiogram.  4. Check D-dimer.  5. Cardiology  consultation.  I have spoken with Dr. Olga Millers and      he will have his team to see the patient.   Further recommendations will depend on the patient's hospital progress.      Wilson Singer, M.D.  Electronically Signed     NCG/MEDQ  D:  06/02/2008  T:  06/02/2008  Job:  [id number]   cc:   Rollene Rotunda, MD, [facility]  Ernestina Penna, M.D.

## 2010-11-25 NOTE — Discharge Summary (Signed)
[name], LOVETT             ACCOUNT NO.:  [id number]   MEDICAL RECORD NO.:  [id number]          PATIENT TYPE:  INP   LOCATION:  3728                         FACILITY:  [facility]   PHYSICIAN:  Elliot Cousin, M.D.    DATE OF BIRTH:  1941-07-12   DATE OF ADMISSION:  06/02/2008  DATE OF DISCHARGE:  06/05/2008                               DISCHARGE SUMMARY   DISCHARGE DIAGNOSES:  1. Supraventricular tachycardia.  2. Moderate coronary artery disease with a discrete 50% proximal      lesion in the circumflex artery, discrete eccentric 70% mid lesion      in the circumflex artery, diffuse luminal irregularities with 50%      proximal lesion in the left anterior descending, diffuse luminal      irregularities with a 30% proximal lesion in the right coronary      artery, and ejection fraction 65% per cardiac catheterization by      Dr. Tonny Bollman on June 04, 2008.  3. Hypertension.  4. Type 2 diabetes mellitus.  5. Hyperlipidemia (the patient said she was intolerant to STATINS).  6. Hypothyroidism.  7. Morbid obesity.  8. Obstructive sleep apnea and narcolepsy.  (Methylphenidate has been      discontinued by cardiology).  9. E. coli urinary tract infection.   DISCHARGE MEDICATIONS:  1. Aspirin 81 mg daily.  2. Metoprolol 25 mg b.i.d.  3. Lisinopril 2.5 mg daily.  4. Zetia 10 mg daily.  5. Fluvastatin 40 mg q.h.s.  (The patient was willing to try this      statin).  6. Sublingual nitroglycerine every 5 minutes x3 as needed for chest      pain.  7. Darvocet-N 100 q.i.d. p.r.n.  8. Trazodone 150 mg two tablets nightly  9. Vitamin D 1000 international units daily.  10.Januvia 100 mg daily.  11.Clonazepam 0.5 mg t.i.d. p.r.n.  12.Vicodin 10/325 mg every 6 hours as needed for pain.  13.Omeprazole CR 40 mg daily.  14.Cymbalta 62 mg three tablets daily.  15.Levothyroxine 50 mcg daily.  16.Loperamide hydrochloride 2 mg p.r.n.  17.Humalog 75/25, 50 units subcu b.i.d.  18.Cipro 500 mg b.i.d for 7 days.  19.CPAP q.h.s.   DISCHARGE DISPOSITION:  The patient is being discharged to home in  improved and stable condition today.  She was advised to follow up with  cardiologist Dr. Shirlee Latch on June 15, 2008, at 11 o'clock a.m.  She was  also advised to follow up with Dr. Maple Hudson in 1-2 weeks and Dr. Christell Constant in 1-  2 weeks.   CONSULTATIONS:  1. [name] Som, MD, [facility]  2. [name]. Excell Seltzer, MD  3. [name], MD   PROCEDURES PERFORMED:  1. Cardiac catheterization by Dr. Excell Seltzer on June 04, 2008.  The      results are above.  2. CT scan of the chest (CT angiogram) on June 03, 2008.  The      results revealed no evidence of acute pulmonary embolism.  Mild      bilateral lower lung atelectasis versus scarring.  Mild      cardiomegaly.  3. A  2-D echocardiogram on June 04, 2008.  The results revealed an      ejection fraction estimated to be 60-65%, mild mitral valvular      regurgitation, and mildly dilated left atrium.   HISTORY OF PRESENT ILLNESS:  The patient is a 70 year old woman with a  past medical history significant for morbid obesity, obstructive sleep  apnea, hypertension, and type 2 diabetes mellitus.  She presented to the  emergency department on June 02, 2008, with a chief complaint of  burning and pushing-type chest pain across her entire chest.  The pain  radiated down both arms.  When she was evaluated in the emergency  department, she was noted to be tachycardiac with a heart rate reaching  a high of 210 beats per minute.  There was evidence of supraventricular  tachycardia.  She was given a bolus of Cardizem and subsequently started  on a Cardizem drip.  Shortly thereafter, she reverted to normal sinus  rhythm.  Her initial cardiac markers were negative.  She was admitted  for further evaluation and management.   For additional details, please see the dictated history and physical.   HOSPITAL COURSE:  1.  SUPRAVENTRICULAR TACHYCARDIA AND MODERATE CORONARY ARTERY DISEASE.      Once the patient stabilized in the emergency department, she was      admitted to a telemetry bed.  For further evaluation, cardiac      enzymes were ordered as well as a D-dimer and TSH.  Her cardiac      enzymes were essentially negative.  Her troponin I did increase      modestly to 0.09 only once.  Her CK and CK-MB remained within      normal limits.  The followup troponin-I normalized to 0.04.  Her D-      dimer was elevated at 0.96.  Her TSH was within normal limits at      4.436.  Subsequently, a 2-D echocardiogram and CT scan of the chest      were ordered.  The CT scan of the chest was negative for PE.  Her 2-      D echocardiogram revealed preserved LV function with an ejection      fraction ranging from 60-65% and no significant valvular      abnormalities.  Her EKG on followup revealed sinus bradycardia with      a heart rate of 57 beats per minute and no acute abnormalities      otherwise.  Dr. Jens Som, cardiologist was consulted.  He added      Lopressor 25 mg b.i.d. for long-term treatment of supraventricular      tachycardia.  He recommended that the patient be further assessed      with a cardiac catheterization.  The patient agreed.  In addition,      a fasting lipid profile was ordered and it revealed a total      cholesterol of 227, triglycerides of 160, HDL of 24, and LDL of      171.  The patient stated that she had been tried on treatment with      a statin medication in the past; however, she could not tolerate it      because of muscle pain.  She was however willing to try Zetia and      fluvastatin upon discharge.  The patient stated that she may not be      able to afford Zetia, however.  After the patient agreed  to the      cardiac catheterization, it was performed by cardiologist Dr.      Tonny Bollman  on June 04, 2008.  The results are indicated      above.  The dose of lisinopril  was decreased to 2.5 mg daily after      the addition of metoprolol.  Dr. Shirlee Latch who saw the patient in      followup, recommended that the methylphenidate be discontinued      because of underlying coronary artery disease and SVT.  The patient      understood.  She was advised to follow up with Dr. Maple Hudson for      further management of her obstructive sleep apnea and narcolepsy.  2. HYPOTHYROIDISM.  The patient was maintained on Levoxyl during the      hospitalization.  Her TSH was within normal limits at 4.4.  3. TYPE 2 DIABETES MELLITUS.  The patient was started on sliding scale      NovoLog q.a.c., q.h.s., as well as NPH b.i.d. to stimulate her      usual home dose of 75/25 insulin.  Her hemoglobin A1c was 7.9.      Upon discharge, she was advised to restart 75/25 insulin as      previously prescribed.  Further management will be deferred to her      primary care physician Dr. Christell Constant.  4. OBSTRUCTIVE SLEEP APNEA WITH NARCOLEPSY.  The patient is treated      chronically with CPAP and previously with methylphenidate.  As      indicated above, methylphenidate has been discontinued because of      her underlying coronary artery disease and SVT.  5. ESCHERICHIA COLI URINARY TRACT INFECTION.  The patient did not have      any overt complaint of painful urination.  However, she did state      that the Foley catheter was uncomfortable.  A urinalysis and      culture were ordered.  The culture grew out greater than 100,000      colonies of E-coli.  Upon discharge, the patient was started on      Cipro for a total of 7 days.  The Foley catheter was discontinued      prior to hospital discharge.      Elliot Cousin, M.D.  Electronically Signed     DF/MEDQ  D:  06/05/2008  T:  06/05/2008  Job:  [id number]   cc:   Ernestina Penna, M.D.  Clinton D. Maple Hudson, MD, FCCP, [facility] P[facility]. Excell Seltzer, MD  [name], MD

## 2010-11-26 ENCOUNTER — Ambulatory Visit (INDEPENDENT_AMBULATORY_CARE_PROVIDER_SITE_OTHER): Payer: Medicare Other | Admitting: Cardiology

## 2010-11-26 ENCOUNTER — Encounter: Payer: Self-pay | Admitting: Cardiology

## 2010-11-26 DIAGNOSIS — I498 Other specified cardiac arrhythmias: Secondary | ICD-10-CM

## 2010-11-26 DIAGNOSIS — R0989 Other specified symptoms and signs involving the circulatory and respiratory systems: Secondary | ICD-10-CM

## 2010-11-26 DIAGNOSIS — R0600 Dyspnea, unspecified: Secondary | ICD-10-CM

## 2010-11-26 MED ORDER — POTASSIUM CHLORIDE ER 10 MEQ PO TBCR: 10.0000 meq | EXTENDED_RELEASE_TABLET | ORAL | Status: DC

## 2010-11-26 NOTE — Progress Notes (Signed)
HPI The patient presents for evaluation of dyspnea.  She has a history of SVT.  Since I last saw her she has continued to have dyspnea.  She reports that when she wakes up in the morning she short of breath. She's not been taking her Lasix as she has incontinence and she is confined to the house and she takes this. She should be down because she can't sit in a recliner. She has had her CPAP adjusted and this seems to be working better. She continues to have a very depressed affect. She is not describing any chest pressure, neck or arm discomfort. She does have some continued mild lower extremity swelling which is actually minimal given her morbid obesity.  Allergies  Allergen Reactions  . Codeine     REACTION: hyper  . Penicillins     REACTION: itch    Current Outpatient Prescriptions  Medication Sig Dispense Refill  . Cholecalciferol (VITAMIN D-3 PO) Take by mouth daily.        . DULoxetine HCl (CYMBALTA PO) Take by mouth. Take  1 tab daily       . escitalopram (LEXAPRO) 20 MG tablet Take 20 mg by mouth daily.        . fluticasone (FLONASE) 50 MCG/ACT nasal spray 2 sprays by Nasal route daily.        . furosemide (LASIX) 40 MG tablet 1 tab daily       . HYDROcodone-acetaminophen (NORCO) 10-325 MG per tablet Take 1 tablet by mouth every 6 (six) hours as needed.        . insulin aspart (NOVOLOG) 100 UNIT/ML injection Inject into the skin 3 (three) times daily before meals.        . insulin glargine (LANTUS) 100 UNIT/ML injection Inject into the skin at bedtime.        Marland Kitchen levothyroxine (SYNTHROID, LEVOTHROID) 75 MCG tablet Take 75 mcg by mouth daily.        . sotalol (BETAPACE) 80 MG tablet Take 1 1/2 half tab twice a day       . warfarin (COUMADIN) 5 MG tablet Take 5 mg by mouth daily.        Marland Kitchen DISCONTD: clonazePAM (KLONOPIN) 0.5 MG tablet Take 0.5 mg by mouth 2 (two) times daily as needed.        Marland Kitchen DISCONTD: insulin lispro (HUMALOG) 100 UNIT/ML injection Inject into the skin 3 (three) times  daily before meals.        Marland Kitchen DISCONTD: levothyroxine (SYNTHROID, LEVOTHROID) 100 MCG tablet Take 100 mcg by mouth daily.         Past Medical History  Diagnosis Date  . Sleep apnea   . Morbid obesity   . Allergic rhinitis   . Diabetes mellitus     type 2  . Back pain   . Hypothyroidism   . CAD (coronary artery disease)   . Hyperlipemia   . Fibromyalgia   . Narcolepsy   . SVT (supraventricular tachycardia)     afib  . 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 Total abdominal hysterectomy   . Tubal ligation   . Appendectomy     ROS:  As stated in the HPI and negative for all other systems.  PHYSICAL EXAM BP 126/60  Pulse 69  Resp 16  Ht 5\' 4"  (1.626 m)  Wt 329 lb (149.233 kg)  BMI 56.47 kg/m2 General:  obese.  Head:  normocephalic and atraumatic Eyes:  PERRLA/EOM intact; conjunctiva and lids normal. Neck:  No bruits, the patient is examined seated at 90 and I could not appreciate jugular venous distention or other abnormalities but her morbid obesity compromises the exam Lungs:  Clear bilaterally to auscultation and percussion. Heart:  Distant heart sounds, S1 and S2 within normal limits, no obvious murmurs rubs or gallops Abdomen:  large pannus with normal bowel sounds, no rebound or guarding, it would be impossible to assess organomegaly no midline pulsatile mass or bruit Msk:  Back normal, normal gait. Muscle strength and tone normal. Extremities:  Chronic venous stasis changes, no edema, 2+ pulses Neurologic:  Alert and oriented x 3. Skin:  Intact without lesions or rashes. Cervical Nodes:  no significant adenopathy Psych:  depressed affect.    EKG:  Sinus rhythm, rate 69, axis within normal limits, intervals within normal limits, no acute ST-T wave changes.   ASSESSMENT AND PLAN

## 2010-11-26 NOTE — Assessment & Plan Note (Signed)
This is a problem that is contributing to many symptoms. She has discussed bariatric surgery in the past and I would encourage consideration of this significant step

## 2010-11-26 NOTE — Assessment & Plan Note (Signed)
I suspect this is multifactorial. There could be some element of diastolic dysfunction. I will check a BNP level. I have asked her to take her Lasix for the next 3 days and I will also give her 10 mEq potassium each day. She is to try to keep her feet elevated. We spent a long time discussing salt restriction which she does not do.

## 2010-11-26 NOTE — Assessment & Plan Note (Signed)
She has had no further symptomatic supraventricular arrhythmias.

## 2010-11-26 NOTE — Patient Instructions (Addendum)
Take Furosemide 40 mg a day for 3 days Take Potassium Chloride 10 mEq daily with Fourosemide Have B Nat Peptide  (blodd work drawn) 11/27/2010 Follow up with Dr Antoine Poche in 2 months in [place]

## 2010-11-28 NOTE — Assessment & Plan Note (Signed)
[facility]                             PULMONARY OFFICE NOTE   [name], [name]                    MRN:          [id number]  DATE:08/18/2006                            DOB:          01-Jan-1941    REASON FOR CONSULTATION:  Bad cough.   HISTORY:  This is a 70 year old white female, remote smoker, with a year  and a half history of a dry cough that is present all day long, but  worse immediately on lying down and has not responded yet to Singulair  or cough medications.  She was not specific about what cough medicines  have been tried.  She denies any overt sinus or reflux symptoms, fevers,  chills, sweats, orthopnea, PND, or leg swelling.  She does not remember  at the onset of the cough whether there was any obvious triggering  factor, but denies any triggering factors now except the supine  position.   She does have a history since childhood of itching, sneezing in the  Spring and the Fall with stuffy nose but states that this does not  correlate with the cough.   PAST MEDICAL HISTORY:  Significant for morbid obesity complicated by  diabetes, sleep apnea (she said she had it before but does not have it  now since she lost 70 pounds), cholecystectomy, hysterectomy, and  appendectomy.   ALLERGIES:  NONE KNOWN.   MEDICATIONS:  Trazodone, oxybutynin, and insulin injections, as well as  multiple p.r.n. pain medicines and anxiolytics.   SOCIAL HISTORY:  She quit smoking 35 years ago, is on disability.  No  unusual travel, pet, or hobby exposure.   FAMILY HISTORY:  Was reviewed in detail and significant for the absence  of respiratory disease or atopy.   REVIEW OF SYSTEMS:  Also taken in detail on worksheet, negative except  as outlined above.   PHYSICAL EXAMINATION:  Pleasant, ambulatory, massively obese white  female in no acute distress who nevertheless can get up on exam table  with minimum assistance.  She is afebrile, stable vital  signs.  HEENT:  Significant for moderate turbinate edema, oropharynx is clear  with no excessive postnasal drainage or cobblestoning.  Dentition is  intact.  NECK:  Supple without cervical adenopathy or tenderness, trachea is  midline, no thyromegaly.  LUNGS:  Fields perfectly clear bilaterally to auscultation and  percussion with no cough listed on inspiratory or expiratory maneuvers.  There is a regular rhythm without murmur, gallop, or rub present.  ABDOMEN:  Obese, otherwise benign.  EXTREMITIES:  Warm without calf tenderness, cyanosis, clubbing, or  edema.   Chest x-ray was reviewed from July 21, 2006, is unremarkable.   IMPRESSION:  Chronic cough dating back over a year that is worse when  she lies down at night.  It is strongly suggestive of either reflux or  sinus disease.  I suspect there is also a cyclical component with the  cough at this point given its clonicity.   I therefore spent extra time going over a cyclical cough regimen with  the patient which includes the use of Delsym plus/minus tramadol  the  patient suppressive excessive coughing, Zegerid 40 mg nightly to  suppress reflux, and 6 day course of prednisone to address upper airway  inflammation associated with chronic coughing.   I emphasized to her the avoidance of mint and menthol containing  lozenges and reviewed the diet with her as well.   If not improved within 2 weeks in terms of the cough control I am going  to recommend she proceed with a sinus CT scan before she returns here at  2 weeks.     Charlaine Dalton. Sherene Sires, MD, [facility]  Electronically Signed    MBW/MedQ  DD: 08/19/2006  DT: 08/19/2006  Job #: [id number]   cc:   Ernestina Penna, M.D.

## 2010-11-28 NOTE — Assessment & Plan Note (Signed)
[facility]                             PULMONARY OFFICE NOTE   [name], [name]                    MRN:          [id number]  DATE:10/11/2006                            DOB:          1941/04/05    HISTORY:  This 70 year old, white female, remote smoker, with a year-and-  a-half history of cough worse immediately upon lying down and had failed  to respond to Singular cough medications when I saw her on 08/18/2006.  I started on Zegerid at that point with a six-day course of prednisone  for any inflammatory component that might be present, and she says she  is better but not completely well at this point.  It turns out she ran  out of the Zegerid several weeks ago.  She had been instructed on diet,  but says she has misplaced the sheet she received.  She was also  supposed to call for a sinus CT scan if not 100% satisfied and has  failed to do so.   PHYSICAL EXAMINATION:  GENERAL:  She is a depressed-appearing, obese,  ambulatory, white female in no acute distress.  VITAL SIGNS:  Weight is 322 pounds, this is up another 3 pounds from  previous visit, with a blood pressure of 160/90.  HEENT:  Remarkable for nonspecific turbinate edema with minimal cyanosis  or pallor.  Oropharynx reveals no obvious polyps.  NECK:  Supple without cervical adenopathy or tenderness.  Trachea is  midline.  LUNGS:  Lung fields perfectly clear bilaterally to auscultation and  percussion with no cough elicited on his inspiratory or expiratory  maneuvers.  HEART:  There is a regular rhythm without murmur, gallop, or rub.  ABDOMEN:  Soft and benign.  EXTREMITIES:  Warm without calf tenderness, cyanosis, clubbing, or  edema.   IMPRESSION:  1. Chronic cough that is at least partially responsive to Zegerid and      actually in retrospect did better with Zegerid than she has with      anything prior to it.  Nevertheless, she is concerned that there is      something  draining in her throat that's not reflux related and      wants an allergy workup.  Before we embark in this direction, I      would like to make sure first of all that she understands how to      use a gastroesophageal reflux disease diet (I gave her a second      sheet today and reviewed it with her), and also asked her to take      Zegerid for a full six weeks and at the end of six weeks stop it      abruptly to see if any of her symptoms return.  If so, it may be      that she does have postnasal drainage but that at least part of it      is either related to reflux or it is enough to trigger throat      clearing, which in turn triggers reflux on a secondary basis.  2. I  do note that her blood pressure is borderline elevated, probably      related to her obesity, and that Zegerid may not be the best choice      for her so we will defer long-term management  of the choice of      acid reflux medication (assuming that it is effective) to Dr.      Kathi Der capable hands in the context of doing routine monitors of      blood pressure.  3. I also note that the patient's weight has continued to climb, which      certainly cannot be blamed on Zegerid since she has not been on it      for several weeks and that that progressive weight gain places her      at risk of developing poorly controlled reflux, but also other      secondary complications of obesity including restrictive pulmonary      physiology.  However, a pulmonary followup can be at this point      p.r.n.   At her request, I have arranged for her to be reevaluated by our Allergy  Department, and a sinus CT scan will be done as well.     Charlaine Dalton. Sherene Sires, MD, [facility]  Electronically Signed    MBW/MedQ  DD: 10/11/2006  DT: 10/11/2006  Job #: [id number]   cc:   Ernestina Penna, M.D.

## 2011-01-15 ENCOUNTER — Ambulatory Visit: Payer: Medicare Other | Admitting: Cardiology

## 2011-01-16 ENCOUNTER — Encounter: Payer: Self-pay | Admitting: Cardiology

## 2011-01-21 ENCOUNTER — Ambulatory Visit (INDEPENDENT_AMBULATORY_CARE_PROVIDER_SITE_OTHER): Payer: Medicare Other | Admitting: Cardiology

## 2011-01-21 ENCOUNTER — Encounter: Payer: Self-pay | Admitting: Cardiology

## 2011-01-21 DIAGNOSIS — I498 Other specified cardiac arrhythmias: Secondary | ICD-10-CM

## 2011-01-21 DIAGNOSIS — R0600 Dyspnea, unspecified: Secondary | ICD-10-CM

## 2011-01-21 DIAGNOSIS — R0989 Other specified symptoms and signs involving the circulatory and respiratory systems: Secondary | ICD-10-CM

## 2011-01-21 NOTE — Assessment & Plan Note (Signed)
This is baseline.  We again discussed PRN lasix dosing.

## 2011-01-21 NOTE — Progress Notes (Signed)
HPI The patient presents for evaluation of dyspnea.  She has a history of SVT.  Since I last saw she had an episode of SVT and was treated with adenosine at [facility]  She was last overnight. She did have some chest discomfort with this she reports it is mild. Since then she has had no further chest pressure, neck or arm discomfort. She has had no further palpitations, presyncope or syncope. She had some continued lower extremity swelling but this is actually less than previous. She continues to have O2 dependent dyspnea which is about the same as usual.  Allergies  Allergen Reactions  . Codeine     REACTION: hyper  . Penicillins     REACTION: itch    Current Outpatient Prescriptions  Medication Sig Dispense Refill  . aspirin 81 MG tablet Take 81 mg by mouth daily.        . Cholecalciferol (VITAMIN D-3 PO) Take by mouth daily.        . clonazePAM (KLONOPIN) 0.5 MG tablet Take 0.5 mg by mouth 2 (two) times daily as needed.        . docusate sodium (COLACE) 100 MG capsule Take 100 mg by mouth 2 (two) times daily.        Marland Kitchen escitalopram (LEXAPRO) 20 MG tablet Take 20 mg by mouth daily.        . fluticasone (FLONASE) 50 MCG/ACT nasal spray 2 sprays by Nasal route daily.        . furosemide (LASIX) 40 MG tablet 40 mg 2 (two) times daily. 1 tab daily      . HYDROcodone-acetaminophen (NORCO) 10-325 MG per tablet Take 1 tablet by mouth every 6 (six) hours as needed.        . insulin glargine (LANTUS) 100 UNIT/ML injection Inject into the skin at bedtime.        . Insulin Zinc Human (NOVOLIN L [place]) Inject into the skin as directed.        Marland Kitchen levothyroxine (SYNTHROID, LEVOTHROID) 75 MCG tablet Take 75 mcg by mouth daily.        . nitroGLYCERIN (NITROSTAT) 0.4 MG SL tablet Place 0.4 mg under the tongue every 5 (five) minutes as needed.        . nystatin (MYCOSTATIN) powder Apply topically as directed.        . ondansetron (ZOFRAN) 4 MG tablet Take 4 mg by mouth every 8 (eight) hours as needed.        .  potassium chloride SA (K-DUR,KLOR-CON) 20 MEQ tablet Take 20 mEq by mouth daily.        . Sennosides (SENOKOT PO) Take by mouth daily.        . SERTRALINE HCL PO Take by mouth daily.        . sotalol (BETAPACE) 80 MG tablet Take 1 1/2 half tab twice a day       . traZODone (DESYREL) 150 MG tablet Take 150 mg by mouth at bedtime.        Marland Kitchen warfarin (COUMADIN) 5 MG tablet Take 5 mg by mouth daily.          Past Medical History  Diagnosis Date  . Sleep apnea   . Morbid obesity   . Allergic rhinitis   . Diabetes mellitus     type 2  . Back pain   . Hypothyroidism   . CAD (coronary artery disease)   . Hyperlipemia   . Fibromyalgia   . Narcolepsy   .  SVT (supraventricular tachycardia)     afib  . 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 Total abdominal hysterectomy   . Tubal ligation   . Appendectomy     ROS:  As stated in the HPI and negative for all other systems.  PHYSICAL EXAM BP 138/66  Pulse 62  Resp 18  Ht 5\' 4"  (1.626 m)  Wt 330 lb (149.687 kg)  BMI 56.64 kg/m2 General:  obese.   Head:  normocephalic and atraumatic Eyes:  PERRLA/EOM intact; conjunctiva and lids normal. Neck:  No bruits, the patient is examined seated at 90 and I could not appreciate jugular venous distention or other abnormalities but her morbid obesity compromises the exam Lungs:  Clear bilaterally to auscultation and percussion. Heart:  Distant heart sounds, S1 and S2 within normal limits, no obvious murmurs rubs or gallops Abdomen:  large pannus with normal bowel sounds, no rebound or guarding, it would be impossible to assess organomegaly no midline pulsatile mass or bruit Msk:  Back normal, normal gait. Muscle strength and tone normal. Extremities:  Chronic venous stasis changes, no edema, 2+ pulses Neurologic:  Alert and oriented x 3. Skin:  Intact without lesions or rashes. Cervical Nodes:  no significant adenopathy Psych:  depressed  affect.    ASSESSMENT AND PLAN

## 2011-01-21 NOTE — Assessment & Plan Note (Signed)
I will continue to encourage bariatric surgery.

## 2011-01-21 NOTE — Patient Instructions (Signed)
Please continue your current medications as listed See Dr Antoine Poche back in 6 months.

## 2011-01-21 NOTE — Assessment & Plan Note (Signed)
I reviewed extensively the vastus nodes. At the time of that presentation she was not taking her sotalol routinely. She is now. We discussed at length they'll maneuvers that might help area the plan at [facility] is that they will do another ablation if she has another episode of this were to happen infrequently. She understands that she would call 911. I would change therapy at this point.

## 2011-04-03 LAB — I-STAT 8, (EC8 V) (CONVERTED LAB)
BUN: 20
Bicarbonate: 26.2 — ABNORMAL HIGH
Hemoglobin: 14.3
Operator id: 114531
pH, Ven: 7.373 — ABNORMAL HIGH

## 2011-04-15 LAB — COMPREHENSIVE METABOLIC PANEL
ALT: 19
Alkaline Phosphatase: 78
CO2: 31
Chloride: 101
GFR calc non Af Amer: >60
Glucose, Bld: 176 — ABNORMAL HIGH
Potassium: 4.1
Sodium: 137
Total Bilirubin: 0.5
Total Protein: 6

## 2011-04-15 LAB — URINE MICROSCOPIC-ADD ON

## 2011-04-15 LAB — D-DIMER, QUANTITATIVE
D-Dimer, Quant: 0.96 — ABNORMAL HIGH
D-Dimer, Quant: 1.03 — ABNORMAL HIGH

## 2011-04-15 LAB — URINALYSIS, ROUTINE W REFLEX MICROSCOPIC
Glucose, UA: >1000 — AB
Hgb urine dipstick: NEGATIVE
Leukocytes, UA: NEGATIVE
Protein, ur: NEGATIVE
Specific Gravity, Urine: 1.021
Urobilinogen, UA: 1

## 2011-04-15 LAB — GLUCOSE, CAPILLARY
Glucose-Capillary: 161 — ABNORMAL HIGH
Glucose-Capillary: 175 — ABNORMAL HIGH
Glucose-Capillary: 207 — ABNORMAL HIGH
Glucose-Capillary: 245 — ABNORMAL HIGH
Glucose-Capillary: 250 — ABNORMAL HIGH

## 2011-04-15 LAB — CARDIAC PANEL(CRET KIN+CKTOT+MB+TROPI)
CK, MB: 1.5
Relative Index: INVALID
Total CK: 58

## 2011-04-15 LAB — LIPID PANEL
Cholesterol: 227 — ABNORMAL HIGH
Total CHOL/HDL Ratio: 9.5

## 2011-04-15 LAB — CBC
HCT: 38.5
HCT: 40.2
Hemoglobin: 13.2
MCV: 95.9
RBC: 4.01
RBC: 4.17
WBC: 8.5
WBC: 8.8

## 2011-04-15 LAB — URINE CULTURE

## 2011-04-15 LAB — CK TOTAL AND CKMB (NOT AT ARMC)
CK, MB: 1.9
Total CK: 57

## 2011-04-15 LAB — BASIC METABOLIC PANEL
BUN: 11
Chloride: 97
Potassium: 4.6

## 2011-04-15 LAB — POCT I-STAT, CHEM 8
BUN: 15
Chloride: 105
HCT: 42

## 2011-04-15 LAB — B-NATRIURETIC PEPTIDE (CONVERTED LAB): Pro B Natriuretic peptide (BNP): 146 — ABNORMAL HIGH

## 2011-04-15 LAB — POCT CARDIAC MARKERS: Myoglobin, poc: 55.8

## 2011-04-30 LAB — DIFFERENTIAL
Basophils Absolute: 0
Basophils Relative: 0
Monocytes Absolute: 0.6
Neutro Abs: 3.3
Neutrophils Relative %: 51

## 2011-04-30 LAB — CBC
MCHC: 34.6
RDW: 13.1

## 2011-04-30 LAB — CULTURE, BLOOD (ROUTINE X 2): Culture: NO GROWTH

## 2011-04-30 LAB — BASIC METABOLIC PANEL
BUN: 9
CO2: 29
Calcium: 8.8
Creatinine, Ser: 0.62
Glucose, Bld: 212 — ABNORMAL HIGH

## 2012-07-22 ENCOUNTER — Encounter: Payer: Self-pay | Admitting: *Deleted

## 2012-07-22 DIAGNOSIS — F329 Major depressive disorder, single episode, unspecified: Secondary | ICD-10-CM

## 2012-07-22 DIAGNOSIS — G8929 Other chronic pain: Secondary | ICD-10-CM

## 2012-07-22 DIAGNOSIS — F411 Generalized anxiety disorder: Secondary | ICD-10-CM | POA: Insufficient documentation

## 2012-07-22 DIAGNOSIS — G43909 Migraine, unspecified, not intractable, without status migrainosus: Secondary | ICD-10-CM | POA: Insufficient documentation

## 2012-07-22 DIAGNOSIS — M545 Low back pain: Secondary | ICD-10-CM

## 2012-07-22 DIAGNOSIS — E1140 Type 2 diabetes mellitus with diabetic neuropathy, unspecified: Secondary | ICD-10-CM | POA: Insufficient documentation

## 2012-07-22 DIAGNOSIS — I4891 Unspecified atrial fibrillation: Secondary | ICD-10-CM

## 2012-07-22 DIAGNOSIS — E876 Hypokalemia: Secondary | ICD-10-CM | POA: Insufficient documentation

## 2012-07-22 DIAGNOSIS — F32A Depression, unspecified: Secondary | ICD-10-CM | POA: Insufficient documentation

## 2012-07-22 DIAGNOSIS — I509 Heart failure, unspecified: Secondary | ICD-10-CM | POA: Insufficient documentation

## 2012-09-28 ENCOUNTER — Ambulatory Visit: Payer: Self-pay | Admitting: Pharmacist

## 2012-09-28 ENCOUNTER — Other Ambulatory Visit: Payer: Self-pay | Admitting: Pharmacist

## 2012-10-07 ENCOUNTER — Other Ambulatory Visit: Payer: Self-pay

## 2012-10-07 MED ORDER — FUROSEMIDE 40 MG PO TABS: 40.0000 mg | ORAL_TABLET | Freq: Every day | ORAL | Status: DC

## 2012-10-11 LAB — POCT INR: INR: 2.1

## 2012-10-12 ENCOUNTER — Ambulatory Visit (INDEPENDENT_AMBULATORY_CARE_PROVIDER_SITE_OTHER): Payer: Medicare Other | Admitting: Pharmacist

## 2012-10-12 DIAGNOSIS — I4891 Unspecified atrial fibrillation: Secondary | ICD-10-CM

## 2012-10-13 ENCOUNTER — Other Ambulatory Visit: Payer: Self-pay | Admitting: *Deleted

## 2012-10-13 ENCOUNTER — Telehealth: Payer: Self-pay | Admitting: Family Medicine

## 2012-10-13 MED ORDER — DULOXETINE HCL 30 MG PO CPEP: 30.0000 mg | ORAL_CAPSULE | Freq: Every day | ORAL | Status: DC

## 2012-10-13 NOTE — Telephone Encounter (Signed)
Patient's BG is 400.  She just ate cereal.  [name] come into office.  Recommended she give 12 units of novolog /short actin insulin.  Recheck BG in 1 hour and call provider on call with results.   [name] will make appt to f/u with Dr. Christell Constant and wrph.

## 2012-10-13 NOTE — Telephone Encounter (Signed)
Call prescription for Cymbalta generic 30 mg 1 daily refill as needed

## 2012-10-13 NOTE — Telephone Encounter (Signed)
DWM TO ADDRESS REFILL/CHANGE ON MED

## 2012-10-18 ENCOUNTER — Telehealth: Payer: Self-pay | Admitting: Family Medicine

## 2012-10-18 NOTE — Telephone Encounter (Signed)
Informed pt she could take the medicine in the AM but if it makes her sleepy she can switch to before bedtime

## 2012-10-19 ENCOUNTER — Other Ambulatory Visit: Payer: Self-pay | Admitting: *Deleted

## 2012-10-19 MED ORDER — SOTALOL HCL 80 MG PO TABS: ORAL_TABLET | ORAL | Status: DC

## 2012-10-20 ENCOUNTER — Ambulatory Visit (INDEPENDENT_AMBULATORY_CARE_PROVIDER_SITE_OTHER): Payer: Medicare Other | Admitting: Pharmacist

## 2012-10-20 DIAGNOSIS — I4891 Unspecified atrial fibrillation: Secondary | ICD-10-CM

## 2012-10-21 ENCOUNTER — Encounter: Payer: Self-pay | Admitting: Family Medicine

## 2012-10-24 ENCOUNTER — Encounter: Payer: Self-pay | Admitting: *Deleted

## 2012-10-24 ENCOUNTER — Other Ambulatory Visit: Payer: Self-pay | Admitting: Family Medicine

## 2012-10-24 NOTE — Telephone Encounter (Signed)
RX called into pharmacy. Pt notified.

## 2012-10-25 ENCOUNTER — Telehealth: Payer: Self-pay | Admitting: *Deleted

## 2012-10-25 NOTE — Telephone Encounter (Signed)
Change back to Klonopin 1 mg,and the way that she has been taking it. It was not clear in the chart which strength that she was taking, so I okayed the lower strength.She can take the .5 until she finishes them then go back to the higher strength pill. Sorry. She needs to try to take less any way.

## 2012-10-25 NOTE — Telephone Encounter (Signed)
Pt upset that Klonopin dose was reduced to 0.5mg  Wants refill on Klonopin 1mg 

## 2012-10-26 NOTE — Telephone Encounter (Signed)
Notified pharmacy and pt

## 2012-10-28 LAB — POCT INR: INR: 2.3

## 2012-10-28 LAB — PROTIME-INR

## 2012-10-31 ENCOUNTER — Ambulatory Visit (INDEPENDENT_AMBULATORY_CARE_PROVIDER_SITE_OTHER): Payer: Medicare Other | Admitting: Pharmacist

## 2012-10-31 DIAGNOSIS — I4891 Unspecified atrial fibrillation: Secondary | ICD-10-CM

## 2012-11-04 LAB — POCT INR: INR: 1.8

## 2012-11-07 ENCOUNTER — Ambulatory Visit (INDEPENDENT_AMBULATORY_CARE_PROVIDER_SITE_OTHER): Payer: Medicare Other | Admitting: Pharmacist

## 2012-11-07 DIAGNOSIS — I4891 Unspecified atrial fibrillation: Secondary | ICD-10-CM

## 2012-11-09 ENCOUNTER — Ambulatory Visit: Payer: Self-pay | Admitting: Family Medicine

## 2012-11-10 ENCOUNTER — Ambulatory Visit (INDEPENDENT_AMBULATORY_CARE_PROVIDER_SITE_OTHER): Payer: Medicare Other | Admitting: Pharmacist

## 2012-11-10 DIAGNOSIS — I4891 Unspecified atrial fibrillation: Secondary | ICD-10-CM

## 2012-11-14 ENCOUNTER — Other Ambulatory Visit: Payer: Self-pay

## 2012-11-14 MED ORDER — FLUTICASONE PROPIONATE 50 MCG/ACT NA SUSP: 2.0000 | Freq: Every day | NASAL | Status: DC

## 2012-11-15 ENCOUNTER — Telehealth: Payer: Self-pay | Admitting: *Deleted

## 2012-11-15 MED ORDER — ESCITALOPRAM OXALATE 20 MG PO TABS: 20.0000 mg | ORAL_TABLET | Freq: Every day | ORAL | Status: DC

## 2012-11-15 NOTE — Telephone Encounter (Signed)
Pt cannot afford Cymbalta, she is in the donut hole Wants to go back on the Lexapro 20mg 

## 2012-11-16 ENCOUNTER — Telehealth: Payer: Self-pay | Admitting: *Deleted

## 2012-11-16 NOTE — Telephone Encounter (Signed)
Can try referral to [employer] Patient Assistance - Blima Singer. Do you still have referral form for this?

## 2012-11-16 NOTE — Telephone Encounter (Signed)
Pt called and says she is donut hole and cant afford her insulin. She says one bottle is $600.00 and the other is $400.00. Needs advise on what she should do.

## 2012-11-17 ENCOUNTER — Other Ambulatory Visit: Payer: Self-pay | Admitting: Pharmacist

## 2012-11-19 ENCOUNTER — Other Ambulatory Visit: Payer: Self-pay | Admitting: Family Medicine

## 2012-11-22 ENCOUNTER — Telehealth: Payer: Self-pay | Admitting: Pharmacist Clinician (PhC)/ Clinical Pharmacy Specialist

## 2012-11-22 NOTE — Telephone Encounter (Signed)
Called patient with INR results and instructions

## 2012-11-29 ENCOUNTER — Ambulatory Visit (INDEPENDENT_AMBULATORY_CARE_PROVIDER_SITE_OTHER): Payer: Medicare Other | Admitting: Pharmacist Clinician (PhC)/ Clinical Pharmacy Specialist

## 2012-11-29 DIAGNOSIS — I4891 Unspecified atrial fibrillation: Secondary | ICD-10-CM

## 2012-11-29 NOTE — Progress Notes (Signed)
Notified patient of results and to continue taking her warfarin the same way.  No changes in medication or signs of bleeding.  Recheck in 1 week

## 2012-11-30 ENCOUNTER — Telehealth: Payer: Self-pay | Admitting: Family Medicine

## 2012-11-30 NOTE — Addendum Note (Signed)
Addended by: Henrene Pastor on: 11/30/2012 08:16 AM   Modules accepted: Level of Service

## 2012-11-30 NOTE — Telephone Encounter (Signed)
Chari Manning, PharmD tried to call patient yesterday per not on fax from [facility] but phone was busy.  I called and spoke to patient regarding warfarin dosing and instructed her to continue to take 1/2 tablet [= 2.5mg ] daily and recheck INR at home next week.

## 2012-12-08 ENCOUNTER — Ambulatory Visit (INDEPENDENT_AMBULATORY_CARE_PROVIDER_SITE_OTHER): Payer: Medicare Other | Admitting: Pharmacist

## 2012-12-08 DIAGNOSIS — I4891 Unspecified atrial fibrillation: Secondary | ICD-10-CM

## 2012-12-16 ENCOUNTER — Other Ambulatory Visit: Payer: Self-pay | Admitting: Family Medicine

## 2012-12-19 ENCOUNTER — Ambulatory Visit: Payer: Self-pay | Admitting: Pharmacist

## 2012-12-19 DIAGNOSIS — I4891 Unspecified atrial fibrillation: Secondary | ICD-10-CM

## 2012-12-27 ENCOUNTER — Telehealth: Payer: Self-pay | Admitting: Pharmacist

## 2012-12-27 ENCOUNTER — Ambulatory Visit (INDEPENDENT_AMBULATORY_CARE_PROVIDER_SITE_OTHER): Payer: Medicare Other | Admitting: Pharmacist

## 2012-12-27 DIAGNOSIS — I4891 Unspecified atrial fibrillation: Secondary | ICD-10-CM

## 2012-12-27 LAB — POCT INR: INR: 2

## 2012-12-27 LAB — PROTIME-INR

## 2012-12-27 NOTE — Telephone Encounter (Signed)
Patient called about inr results and instructions for warfarin dosing.

## 2013-01-03 LAB — POCT INR: INR: 2.6

## 2013-01-03 LAB — PROTIME-INR

## 2013-01-04 ENCOUNTER — Ambulatory Visit (INDEPENDENT_AMBULATORY_CARE_PROVIDER_SITE_OTHER): Payer: Medicare Other | Admitting: Pharmacist

## 2013-01-04 DIAGNOSIS — I4891 Unspecified atrial fibrillation: Secondary | ICD-10-CM

## 2013-01-04 NOTE — Progress Notes (Signed)
See scanned image of INR result.  Per note patient was notified by clinical pharmacist, Chari Manning to continue warfarin at current dose and recheck home INR in 1 week.

## 2013-01-05 ENCOUNTER — Telehealth: Payer: Self-pay | Admitting: Family Medicine

## 2013-01-05 NOTE — Telephone Encounter (Signed)
Novolog x2 given for pickup by Sheryle Spray

## 2013-01-07 ENCOUNTER — Emergency Department (HOSPITAL_COMMUNITY): Payer: Medicare Other

## 2013-01-07 ENCOUNTER — Encounter (HOSPITAL_COMMUNITY): Payer: Self-pay | Admitting: *Deleted

## 2013-01-07 ENCOUNTER — Inpatient Hospital Stay (HOSPITAL_COMMUNITY)
Admission: EM | Admit: 2013-01-07 | Discharge: 2013-01-09 | DRG: 194 | Disposition: A | Discharge status: Home Health | Payer: Medicare Other | Attending: Internal Medicine | Admitting: Internal Medicine

## 2013-01-07 DIAGNOSIS — E039 Hypothyroidism, unspecified: Secondary | ICD-10-CM | POA: Diagnosis present

## 2013-01-07 DIAGNOSIS — F3289 Other specified depressive episodes: Secondary | ICD-10-CM | POA: Diagnosis present

## 2013-01-07 DIAGNOSIS — M545 Low back pain: Secondary | ICD-10-CM | POA: Diagnosis present

## 2013-01-07 DIAGNOSIS — Q233 Congenital mitral insufficiency: Secondary | ICD-10-CM

## 2013-01-07 DIAGNOSIS — F32A Depression, unspecified: Secondary | ICD-10-CM | POA: Diagnosis present

## 2013-01-07 DIAGNOSIS — R0600 Dyspnea, unspecified: Secondary | ICD-10-CM

## 2013-01-07 DIAGNOSIS — I2510 Atherosclerotic heart disease of native coronary artery without angina pectoris: Secondary | ICD-10-CM | POA: Diagnosis present

## 2013-01-07 DIAGNOSIS — IMO0001 Reserved for inherently not codable concepts without codable children: Secondary | ICD-10-CM | POA: Diagnosis present

## 2013-01-07 DIAGNOSIS — J309 Allergic rhinitis, unspecified: Secondary | ICD-10-CM | POA: Diagnosis present

## 2013-01-07 DIAGNOSIS — Z90710 Acquired absence of both cervix and uterus: Secondary | ICD-10-CM

## 2013-01-07 DIAGNOSIS — I498 Other specified cardiac arrhythmias: Secondary | ICD-10-CM | POA: Diagnosis present

## 2013-01-07 DIAGNOSIS — Z794 Long term (current) use of insulin: Secondary | ICD-10-CM

## 2013-01-07 DIAGNOSIS — M47817 Spondylosis without myelopathy or radiculopathy, lumbosacral region: Secondary | ICD-10-CM | POA: Diagnosis present

## 2013-01-07 DIAGNOSIS — K921 Melena: Secondary | ICD-10-CM | POA: Diagnosis present

## 2013-01-07 DIAGNOSIS — I471 Supraventricular tachycardia, unspecified: Secondary | ICD-10-CM | POA: Diagnosis present

## 2013-01-07 DIAGNOSIS — E1142 Type 2 diabetes mellitus with diabetic polyneuropathy: Secondary | ICD-10-CM | POA: Diagnosis present

## 2013-01-07 DIAGNOSIS — Z87891 Personal history of nicotine dependence: Secondary | ICD-10-CM

## 2013-01-07 DIAGNOSIS — F411 Generalized anxiety disorder: Secondary | ICD-10-CM | POA: Diagnosis present

## 2013-01-07 DIAGNOSIS — E785 Hyperlipidemia, unspecified: Secondary | ICD-10-CM | POA: Diagnosis present

## 2013-01-07 DIAGNOSIS — E1140 Type 2 diabetes mellitus with diabetic neuropathy, unspecified: Secondary | ICD-10-CM

## 2013-01-07 DIAGNOSIS — G47419 Narcolepsy without cataplexy: Secondary | ICD-10-CM | POA: Diagnosis present

## 2013-01-07 DIAGNOSIS — J9610 Chronic respiratory failure, unspecified whether with hypoxia or hypercapnia: Secondary | ICD-10-CM | POA: Diagnosis present

## 2013-01-07 DIAGNOSIS — L299 Pruritus, unspecified: Secondary | ICD-10-CM | POA: Diagnosis present

## 2013-01-07 DIAGNOSIS — G4733 Obstructive sleep apnea (adult) (pediatric): Secondary | ICD-10-CM | POA: Diagnosis present

## 2013-01-07 DIAGNOSIS — K649 Unspecified hemorrhoids: Secondary | ICD-10-CM | POA: Diagnosis present

## 2013-01-07 DIAGNOSIS — Z79899 Other long term (current) drug therapy: Secondary | ICD-10-CM

## 2013-01-07 DIAGNOSIS — E1149 Type 2 diabetes mellitus with other diabetic neurological complication: Secondary | ICD-10-CM | POA: Diagnosis present

## 2013-01-07 DIAGNOSIS — R5381 Other malaise: Secondary | ICD-10-CM | POA: Diagnosis present

## 2013-01-07 DIAGNOSIS — E119 Type 2 diabetes mellitus without complications: Secondary | ICD-10-CM | POA: Diagnosis present

## 2013-01-07 DIAGNOSIS — Z8249 Family history of ischemic heart disease and other diseases of the circulatory system: Secondary | ICD-10-CM

## 2013-01-07 DIAGNOSIS — G8929 Other chronic pain: Secondary | ICD-10-CM | POA: Diagnosis present

## 2013-01-07 DIAGNOSIS — I4891 Unspecified atrial fibrillation: Secondary | ICD-10-CM | POA: Diagnosis present

## 2013-01-07 DIAGNOSIS — Z7901 Long term (current) use of anticoagulants: Secondary | ICD-10-CM

## 2013-01-07 DIAGNOSIS — Z6841 Body Mass Index (BMI) 40.0 and over, adult: Secondary | ICD-10-CM

## 2013-01-07 DIAGNOSIS — K5900 Constipation, unspecified: Secondary | ICD-10-CM | POA: Diagnosis present

## 2013-01-07 DIAGNOSIS — F329 Major depressive disorder, single episode, unspecified: Secondary | ICD-10-CM

## 2013-01-07 DIAGNOSIS — G4730 Sleep apnea, unspecified: Secondary | ICD-10-CM | POA: Diagnosis present

## 2013-01-07 DIAGNOSIS — J189 Pneumonia, unspecified organism: Principal | ICD-10-CM | POA: Diagnosis present

## 2013-01-07 DIAGNOSIS — R0689 Other abnormalities of breathing: Secondary | ICD-10-CM | POA: Diagnosis present

## 2013-01-07 LAB — BLOOD GAS, ARTERIAL
Acid-Base Excess: 5.2 mmol/L — ABNORMAL HIGH (ref 0.0–2.0)
Drawn by: 21310
O2 Content: 2 L/min
O2 Saturation: 98.2 %
TCO2: 26 mmol/L (ref 0–100)

## 2013-01-07 LAB — URINALYSIS, ROUTINE W REFLEX MICROSCOPIC
Bilirubin Urine: NEGATIVE
Glucose, UA: 100 mg/dL — AB
Ketones, ur: NEGATIVE mg/dL
Protein, ur: NEGATIVE mg/dL
pH: 6 (ref 5.0–8.0)

## 2013-01-07 LAB — URINE MICROSCOPIC-ADD ON

## 2013-01-07 LAB — TROPONIN I: Troponin I: <0.3 ng/mL (ref <0.30)

## 2013-01-07 MED ORDER — LEVOFLOXACIN IN D5W 750 MG/150ML IV SOLN
750.0000 mg | Freq: Once | INTRAVENOUS | Status: AC
  Administered 2013-01-07: 750 mg via INTRAVENOUS

## 2013-01-07 NOTE — ED Provider Notes (Addendum)
History  This chart was scribed for Lisa Melter, MD by Ardelia Mems, ED Scribe. This patient was seen in room APA02/APA02 and the patient's care was started at 9:57 PM.  CSN: [id number]  Arrival date & time 01/07/13  2129    Chief Complaint  Patient presents with  . Shortness of Breath    The history is provided by the patient. No language interpreter was used.   HPI Comments: [name] is a 72 y.o. female with a hx of SVT, CAD, T2DM and morbid obesity who presents to the Emergency Department complaining of SOB that exceeds her baseline SOB. Pt states that her CPAP machine with attached oxygen at her home is malfunctioning, and that the pressure is elevated on the machine. Pt states that as the pressure builds in the machine, she becomes progressively more short of breath. Pt states that she uses her CPAP every night and supplemental oxygen every day. Pt states that her machine is going to be fixed Monday, but states that she could not last until Monday with her current SOB. Pt states that she is not able to get out of bed and walk due to weakness and SOB. Pt states that her weakness is worse when she walks. Pt states that she has not seen her doctor in 6 months, and states that she is scheduled to see her PCP in July. Pt states that she is barely able to ambulate with a walker and states that she normally moves via wheelchair. Pt takes several pain pills a day and has constipation at baseline, for which she takes Miralax on a daily basis. Pt also states that she has hemorrhoids and has had some blood in her stool recently. Pt states that she has not been on prednisone recently, but states that she has was on antibiotics several months ago. Pt states that her urine is malodorous and  rusty in appearance. Pt denies fever, cough, chest pain rash or any other symptoms. Pt is a former smoker who quit smoking in 1970.  Dr. Azell Der, [facility] PCP- Dr. Rudi Heap   Past Medical History   Diagnosis Date  . Sleep apnea   . Morbid obesity   . Allergic rhinitis   . Diabetes mellitus     type 2  . Back pain   . Hypothyroidism   . CAD (coronary artery disease)   . Hyperlipemia   . Fibromyalgia   . Narcolepsy   . SVT (supraventricular tachycardia)     afib  . 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 Total abdominal hysterectomy    . Tubal ligation    . Appendectomy     Family History  Problem Relation Age of Onset  . Heart attack Father    History  Substance Use Topics  . Smoking status: Former Smoker    Quit date: 07/13/1968  . Smokeless tobacco: Not on file  . Alcohol Use: Not on file   OB History   Grav Para Term Preterm Abortions TAB SAB Ect Mult Living                 Review of Systems  Constitutional: Negative for fever and chills.  HENT: Negative for congestion, sore throat and rhinorrhea.   Eyes: Negative for visual disturbance.  Respiratory: Positive for shortness of breath. Negative for cough.   Cardiovascular: Negative for chest pain.  Gastrointestinal: Positive for constipation and blood in  stool. Negative for nausea, vomiting, abdominal pain and diarrhea.       Hemorrhoids.  Genitourinary: Negative for dysuria.       Malodorous urine.  Skin: Negative for rash.  Neurological: Positive for weakness. Negative for dizziness.  Psychiatric/Behavioral: Negative for confusion.  A complete 10 system review of systems was obtained and all systems are negative except as noted in the HPI and PMH.   Allergies  Amitriptyline; Codeine; Crestor; Detrol; Lipitor; Lisinopril; and Penicillins  Home Medications   No current outpatient prescriptions on file. Triage Vitals: BP 158/87  Pulse 71  Temp(Src) 98 F (36.7 C) (Oral)  Resp 20  Ht 5\' 4"  (1.626 m)  Wt 313 lb (141.976 kg)  BMI 53.7 kg/m2  SpO2 100%  Physical Exam  Nursing note and vitals reviewed. Constitutional: She is oriented  to person, place, and time. She appears well-developed.  Elderly, obese  HENT:  Head: Normocephalic and atraumatic.  Eyes: Conjunctivae and EOM are normal. Pupils are equal, round, and reactive to light.  Neck: Normal range of motion and phonation normal. Neck supple.  Cardiovascular: Normal rate, regular rhythm and intact distal pulses.   Pulmonary/Chest: Effort normal. No respiratory distress. She has no wheezes. She exhibits no tenderness.  Decreased air movement bilaterally  Abdominal: Soft. She exhibits no distension. There is no tenderness. There is no guarding.  Musculoskeletal: Normal range of motion. She exhibits edema (2+ lower extremity).  Neurological: She is alert and oriented to person, place, and time. She has normal strength. She exhibits normal muscle tone.  Skin: Skin is warm and dry.  Psychiatric: She has a normal mood and affect. Her behavior is normal. Judgment and thought content normal.    ED Course  Procedures (including critical care time)  DIAGNOSTIC STUDIES: Oxygen Saturation is 100% on nasal cannula, normal by my interpretation.    COORDINATION OF CARE: 10:05 PM- Pt advised of plan to receive a blood count, CXR, urine sample and pt agrees.  Medications  aspirin EC tablet 81 mg (not administered)  clonazePAM (KLONOPIN) tablet 0.5 mg (0.5 mg Oral Given 01/08/13 2130)  furosemide (LASIX) tablet 40 mg (40 mg Oral Not Given 01/08/13 0900)  potassium chloride (K-DUR,KLOR-CON) CR tablet 10 mEq (10 mEq Oral Given 01/08/13 2130)  nitroGLYCERIN (NITROSTAT) SL tablet 0.4 mg (not administered)  nystatin (MYCOSTATIN/NYSTOP) topical powder ( Topical Given 01/08/13 2130)  levothyroxine (SYNTHROID, LEVOTHROID) tablet 75 mcg (75 mcg Oral Given 01/08/13 0900)  ondansetron (ZOFRAN) injection 4 mg (not administered)  acetaminophen (TYLENOL) tablet 650 mg (not administered)  loratadine (CLARITIN) tablet 10 mg (10 mg Oral Not Given 01/08/13 0900)  insulin aspart (novoLOG)  injection 0-15 Units (5 Units Subcutaneous Given 01/08/13 1634)  insulin aspart (novoLOG) injection 0-5 Units (2 Units Subcutaneous Given 01/08/13 2202)  insulin glargine (LANTUS) injection 30 Units (30 Units Subcutaneous Given 01/08/13 1958)  oxyCODONE (Oxy IR/ROXICODONE) immediate release tablet 10 mg (10 mg Oral Given 01/08/13 2139)  polyethylene glycol (MIRALAX / GLYCOLAX) packet 17 g (17 g Oral Given 01/08/13 1000)  Warfarin - Pharmacist Dosing Inpatient ( Does not apply Duplicate 01/08/13 1600)  warfarin (COUMADIN) tablet 2.5 mg (2.5 mg Oral Given 01/08/13 1521)  fluticasone (FLONASE) 50 MCG/ACT nasal spray 2 spray (2 sprays Each Nare Given 01/08/13 2130)  sotalol (BETAPACE) tablet 120 mg (120 mg Oral Given 01/08/13 1700)  levofloxacin (LEVAQUIN) tablet 750 mg (750 mg Oral Given 01/08/13 2308)  levofloxacin (LEVAQUIN) IVPB 750 mg (0 mg Intravenous Stopped 01/08/13 0139)  fluconazole (DIFLUCAN) tablet  200 mg (200 mg Oral Given 01/08/13 0345)    Patient Vitals for the past 24 hrs:  BP Temp Temp src Pulse Resp SpO2 Weight  01/08/13 2100 145/50 mmHg 97.5 F (36.4 C) Oral 70 20 100 % -  01/08/13 1332 135/96 mmHg 97.6 F (36.4 C) - 74 18 95 % -  01/08/13 0901 - - - - - 96 % -  01/08/13 0500 - - - - - - 312 lb 13.3 oz (141.9 kg)  01/08/13 0112 139/74 mmHg - - 73 15 99 % -  01/08/13 0045 - - - 63 15 100 % -     Date: 01/07/13  Rate: 65  Rhythm: normal sinus rhythm  QRS Axis: normal  PR and QT Intervals: normal  ST/T Wave abnormalities: normal  PR and QRS Conduction Disutrbances:none  Narrative Interpretation:   Old EKG Reviewed: unchanged- 06/04/10   Labs Reviewed  URINALYSIS, ROUTINE W REFLEX MICROSCOPIC - Abnormal; Notable for the following:    Color, Urine STRAW (*)    Specific Gravity, Urine <1.005 (*)    Glucose, UA 100 (*)    Hgb urine dipstick SMALL (*)    All other components within normal limits  BLOOD GAS, ARTERIAL - Abnormal; Notable for the following:    pCO2 arterial 50.3  (*)    pO2, Arterial 115.0 (*)    Bicarbonate 29.9 (*)    Acid-Base Excess 5.2 (*)    All other components within normal limits  BASIC METABOLIC PANEL - Abnormal; Notable for the following:    Glucose, Bld 262 (*)    All other components within normal limits  HEMOGLOBIN A1C - Abnormal; Notable for the following:    Hemoglobin A1C 8.1 (*)    Mean Plasma Glucose 186 (*)    All other components within normal limits  PRO B NATRIURETIC PEPTIDE - Abnormal; Notable for the following:    Pro B Natriuretic peptide (BNP) 340.3 (*)    All other components within normal limits  COMPREHENSIVE METABOLIC PANEL - Abnormal; Notable for the following:    CO2 34 (*)    Glucose, Bld 186 (*)    Albumin 3.2 (*)    All other components within normal limits  GLUCOSE, CAPILLARY - Abnormal; Notable for the following:    Glucose-Capillary 182 (*)    All other components within normal limits  GLUCOSE, CAPILLARY - Abnormal; Notable for the following:    Glucose-Capillary 161 (*)    All other components within normal limits  GLUCOSE, CAPILLARY - Abnormal; Notable for the following:    Glucose-Capillary 252 (*)    All other components within normal limits  PROTIME-INR - Abnormal; Notable for the following:    Prothrombin Time 27.1 (*)    INR 2.62 (*)    All other components within normal limits  GLUCOSE, CAPILLARY - Abnormal; Notable for the following:    Glucose-Capillary 237 (*)    All other components within normal limits  GLUCOSE, CAPILLARY - Abnormal; Notable for the following:    Glucose-Capillary 244 (*)    All other components within normal limits  GLUCOSE, CAPILLARY - Abnormal; Notable for the following:    Glucose-Capillary 224 (*)    All other components within normal limits  GLUCOSE, CAPILLARY - Abnormal; Notable for the following:    Glucose-Capillary 205 (*)    All other components within normal limits  URINE CULTURE  GRAM STAIN  TROPONIN I  URINE MICROSCOPIC-ADD ON  CBC WITH  DIFFERENTIAL  STREP PNEUMONIAE URINARY  ANTIGEN  CBC  LEGIONELLA ANTIGEN, URINE  PROTIME-INR  CBC  BASIC METABOLIC PANEL    Dg Chest 2 View  01/07/2013   *RADIOLOGY REPORT*  Clinical Data: Shortness of breath, back pain, weakness  CHEST - 2 VIEW  Comparison: 05/22/2008; 09/11/2008Chest CT - 06/03/2008  Findings:  Examination is markedly degraded secondary to patient body habitus.  Grossly unchanged enlarged cardiac silhouette and mediastinal contours with atherosclerotic calcification within the thoracic aorta.  Apparent left basilar heterogeneous air space opacities. Trace left-sided effusion is not excluded.  No definite evidence of edema or pneumothorax.  Grossly unchanged bones.  IMPRESSION: Degraded examination with findings worrisome for left lower lung pneumonia.  A follow-up chest radiograph in 4 to 6 weeks after treatment is recommended to ensure resolution.   Original Report Authenticated By: Tacey Ruiz, MD    1. Dyspnea   2. Hypercapnia   3. CAP (community acquired pneumonia)   4. Unspecified sleep apnea   5. Type II or unspecified type diabetes mellitus without mention of complication, not stated as uncontrolled   6. Depression   7. Atrial fibrillation   8. Chronic respiratory failure     MDM  Dyspnea with possible diminished acquired pneumonia, and hypercapnia. No clear acute respiratory failure. No comparison blood gas, available. She is having a complication with her CPAP machine at home, making it unusable. She would be best, served by evaluation overnight, , for observation.   Nursing Notes Reviewed/ Care Coordinated, and agree without changes. Applicable Imaging Reviewed.  Interpretation of Laboratory Data incorporated into ED treatment    Plan: Admit    I personally performed the services described in this documentation, which was scribed in my presence. The recorded information has been reviewed and is accurate.            Lisa Melter,  MD 01/09/13 0021  Lisa Melter, MD 01/19/13 [phone]

## 2013-01-07 NOTE — ED Notes (Signed)
Pt reports being SOB. She spoke to her PCP who advised her to come to the ER. Pt states her cpap is broken & they will not be able to fix until Monday.

## 2013-01-08 ENCOUNTER — Encounter (HOSPITAL_COMMUNITY): Payer: Self-pay | Admitting: Internal Medicine

## 2013-01-08 DIAGNOSIS — I4891 Unspecified atrial fibrillation: Secondary | ICD-10-CM

## 2013-01-08 DIAGNOSIS — G4730 Sleep apnea, unspecified: Secondary | ICD-10-CM

## 2013-01-08 DIAGNOSIS — R0609 Other forms of dyspnea: Secondary | ICD-10-CM

## 2013-01-08 DIAGNOSIS — F329 Major depressive disorder, single episode, unspecified: Secondary | ICD-10-CM

## 2013-01-08 DIAGNOSIS — J189 Pneumonia, unspecified organism: Secondary | ICD-10-CM | POA: Diagnosis present

## 2013-01-08 DIAGNOSIS — R0689 Other abnormalities of breathing: Secondary | ICD-10-CM | POA: Diagnosis present

## 2013-01-08 DIAGNOSIS — J9610 Chronic respiratory failure, unspecified whether with hypoxia or hypercapnia: Secondary | ICD-10-CM

## 2013-01-08 DIAGNOSIS — E119 Type 2 diabetes mellitus without complications: Secondary | ICD-10-CM

## 2013-01-08 LAB — COMPREHENSIVE METABOLIC PANEL
Albumin: 3.2 g/dL — ABNORMAL LOW (ref 3.5–5.2)
BUN: 13 mg/dL (ref 6–23)
Calcium: 9.6 mg/dL (ref 8.4–10.5)
Creatinine, Ser: 0.54 mg/dL (ref 0.50–1.10)
Total Protein: 7.5 g/dL (ref 6.0–8.3)

## 2013-01-08 LAB — GLUCOSE, CAPILLARY
Glucose-Capillary: 182 mg/dL — ABNORMAL HIGH (ref 70–99)
Glucose-Capillary: 161 mg/dL — ABNORMAL HIGH (ref 70–99)
Glucose-Capillary: 244 mg/dL — ABNORMAL HIGH (ref 70–99)
Glucose-Capillary: 224 mg/dL — ABNORMAL HIGH (ref 70–99)

## 2013-01-08 LAB — CBC
HCT: 41.8 % (ref 36.0–46.0)
MCHC: 33.7 g/dL (ref 30.0–36.0)
MCV: 93.3 fL (ref 78.0–100.0)
RDW: 12.6 % (ref 11.5–15.5)

## 2013-01-08 LAB — CBC WITH DIFFERENTIAL/PLATELET
Basophils Relative: 0 % (ref 0–1)
HCT: 41.1 % (ref 36.0–46.0)
Hemoglobin: 13.5 g/dL (ref 12.0–15.0)
Lymphs Abs: 2.1 10*3/uL (ref 0.7–4.0)
MCH: 31.4 pg (ref 26.0–34.0)
MCHC: 32.8 g/dL (ref 30.0–36.0)
Monocytes Absolute: 0.8 10*3/uL (ref 0.1–1.0)
Monocytes Relative: 10 % (ref 3–12)
Neutro Abs: 4.5 10*3/uL (ref 1.7–7.7)
Neutrophils Relative %: 59 % (ref 43–77)
RBC: 4.3 MIL/uL (ref 3.87–5.11)

## 2013-01-08 LAB — PROTIME-INR: Prothrombin Time: 27.1 seconds — ABNORMAL HIGH (ref 11.6–15.2)

## 2013-01-08 LAB — BASIC METABOLIC PANEL
BUN: 16 mg/dL (ref 6–23)
CO2: 31 mEq/L (ref 19–32)
Chloride: 96 mEq/L (ref 96–112)
Creatinine, Ser: 0.56 mg/dL (ref 0.50–1.10)
Glucose, Bld: 262 mg/dL — ABNORMAL HIGH (ref 70–99)
Potassium: 4 mEq/L (ref 3.5–5.1)

## 2013-01-08 LAB — HEMOGLOBIN A1C
Hgb A1c MFr Bld: 8.1 % — ABNORMAL HIGH (ref <5.7)
Mean Plasma Glucose: 186 mg/dL — ABNORMAL HIGH (ref <117)

## 2013-01-08 MED ORDER — POLYETHYLENE GLYCOL 3350 17 GM/SCOOP PO POWD
17.0000 g | Freq: Every day | ORAL | Status: DC
  Filled 2013-01-08: qty 255

## 2013-01-08 MED ORDER — FUROSEMIDE 40 MG PO TABS
40.0000 mg | ORAL_TABLET | Freq: Every day | ORAL | Status: DC
  Administered 2013-01-09: 40 mg via ORAL
  Filled 2013-01-08 (×2): qty 1

## 2013-01-08 MED ORDER — POLYETHYLENE GLYCOL 3350 17 G PO PACK
17.0000 g | PACK | Freq: Every day | ORAL | Status: DC
  Administered 2013-01-08 – 2013-01-09 (×2): 17 g via ORAL
  Filled 2013-01-08 (×2): qty 1

## 2013-01-08 MED ORDER — INSULIN ASPART 100 UNIT/ML ~~LOC~~ SOLN
0.0000 [IU] | Freq: Three times a day (TID) | SUBCUTANEOUS | Status: DC
  Administered 2013-01-08: 5 [IU] via SUBCUTANEOUS
  Administered 2013-01-08: 8 [IU] via SUBCUTANEOUS
  Administered 2013-01-08: 5 [IU] via SUBCUTANEOUS
  Administered 2013-01-09: 2 [IU] via SUBCUTANEOUS

## 2013-01-08 MED ORDER — ONDANSETRON HCL 4 MG/2ML IJ SOLN: 4.0000 mg | INTRAMUSCULAR | Status: DC | PRN

## 2013-01-08 MED ORDER — INSULIN ASPART 100 UNIT/ML ~~LOC~~ SOLN
0.0000 [IU] | Freq: Every day | SUBCUTANEOUS | Status: DC
  Administered 2013-01-08: 2 [IU] via SUBCUTANEOUS

## 2013-01-08 MED ORDER — FLUCONAZOLE 100 MG PO TABS
200.0000 mg | ORAL_TABLET | Freq: Once | ORAL | Status: AC
  Administered 2013-01-08: 200 mg via ORAL
  Filled 2013-01-08: qty 2

## 2013-01-08 MED ORDER — FLUCONAZOLE 100 MG PO TABS: 100.0000 mg | ORAL_TABLET | Freq: Every day | ORAL | Status: DC

## 2013-01-08 MED ORDER — ACETAMINOPHEN 325 MG PO TABS: 650.0000 mg | ORAL_TABLET | ORAL | Status: DC | PRN

## 2013-01-08 MED ORDER — FLUTICASONE PROPIONATE 50 MCG/ACT NA SUSP
2.0000 | Freq: Two times a day (BID) | NASAL | Status: DC
  Administered 2013-01-08 – 2013-01-09 (×2): 2 via NASAL
  Filled 2013-01-08: qty 16

## 2013-01-08 MED ORDER — INSULIN GLARGINE 100 UNIT/ML ~~LOC~~ SOLN
30.0000 [IU] | Freq: Two times a day (BID) | SUBCUTANEOUS | Status: DC
  Administered 2013-01-08 – 2013-01-09 (×3): 30 [IU] via SUBCUTANEOUS
  Filled 2013-01-08 (×3): qty 0.3

## 2013-01-08 MED ORDER — LEVOFLOXACIN IN D5W 750 MG/150ML IV SOLN
750.0000 mg | INTRAVENOUS | Status: DC
  Administered 2013-01-08: 750 mg via INTRAVENOUS
  Filled 2013-01-08: qty 150

## 2013-01-08 MED ORDER — SOTALOL HCL 80 MG PO TABS
120.0000 mg | ORAL_TABLET | Freq: Two times a day (BID) | ORAL | Status: DC
  Administered 2013-01-08 – 2013-01-09 (×2): 120 mg via ORAL
  Filled 2013-01-08 (×2): qty 1.5

## 2013-01-08 MED ORDER — SOTALOL HCL 120 MG PO TABS
120.0000 mg | ORAL_TABLET | Freq: Two times a day (BID) | ORAL | Status: DC
  Filled 2013-01-08 (×3): qty 1

## 2013-01-08 MED ORDER — LEVOFLOXACIN 750 MG PO TABS
750.0000 mg | ORAL_TABLET | Freq: Every day | ORAL | Status: DC
  Administered 2013-01-08: 750 mg via ORAL
  Filled 2013-01-08: qty 1

## 2013-01-08 MED ORDER — NYSTATIN 100000 UNIT/GM EX POWD
Freq: Two times a day (BID) | CUTANEOUS | Status: DC
  Administered 2013-01-08 – 2013-01-09 (×3): via TOPICAL
  Filled 2013-01-08: qty 15

## 2013-01-08 MED ORDER — WARFARIN SODIUM 2.5 MG PO TABS
2.5000 mg | ORAL_TABLET | ORAL | Status: DC
  Administered 2013-01-08: 2.5 mg via ORAL
  Filled 2013-01-08: qty 1

## 2013-01-08 MED ORDER — FLUTICASONE PROPIONATE 50 MCG/ACT NA SUSP
2.0000 | Freq: Every day | NASAL | Status: DC
  Administered 2013-01-08: 2 via NASAL
  Filled 2013-01-08: qty 16

## 2013-01-08 MED ORDER — POTASSIUM CHLORIDE CRYS ER 10 MEQ PO TBCR
10.0000 meq | EXTENDED_RELEASE_TABLET | Freq: Two times a day (BID) | ORAL | Status: DC
  Administered 2013-01-08 – 2013-01-09 (×3): 10 meq via ORAL
  Filled 2013-01-08 (×3): qty 1

## 2013-01-08 MED ORDER — OXYCODONE HCL 5 MG PO TABS
10.0000 mg | ORAL_TABLET | ORAL | Status: DC | PRN
  Administered 2013-01-08 – 2013-01-09 (×7): 10 mg via ORAL
  Filled 2013-01-08 (×7): qty 2

## 2013-01-08 MED ORDER — LEVOTHYROXINE SODIUM 75 MCG PO TABS
75.0000 ug | ORAL_TABLET | Freq: Every day | ORAL | Status: DC
  Administered 2013-01-08 – 2013-01-09 (×2): 75 ug via ORAL
  Filled 2013-01-08 (×2): qty 1

## 2013-01-08 MED ORDER — WARFARIN - PHARMACIST DOSING INPATIENT: Status: DC

## 2013-01-08 MED ORDER — LORATADINE 10 MG PO TABS
10.0000 mg | ORAL_TABLET | Freq: Every day | ORAL | Status: DC
  Administered 2013-01-09: 10 mg via ORAL
  Filled 2013-01-08 (×2): qty 1

## 2013-01-08 MED ORDER — WARFARIN SODIUM 2.5 MG PO TABS: 2.5000 mg | ORAL_TABLET | Freq: Every day | ORAL | Status: DC

## 2013-01-08 MED ORDER — ASPIRIN EC 81 MG PO TBEC: 81.0000 mg | DELAYED_RELEASE_TABLET | Freq: Every day | ORAL | Status: DC | PRN

## 2013-01-08 MED ORDER — NITROGLYCERIN 0.4 MG SL SUBL
0.4000 mg | SUBLINGUAL_TABLET | SUBLINGUAL | Status: DC | PRN
  Administered 2013-01-09: 0.4 mg via SUBLINGUAL
  Filled 2013-01-08: qty 25

## 2013-01-08 MED ORDER — CLONAZEPAM 0.5 MG PO TABS
0.5000 mg | ORAL_TABLET | Freq: Three times a day (TID) | ORAL | Status: DC
  Administered 2013-01-08 – 2013-01-09 (×6): 0.5 mg via ORAL
  Filled 2013-01-08 (×6): qty 1

## 2013-01-08 NOTE — Progress Notes (Signed)
Patient continues to refuse to wear BIPAP/CPAP at this time and will continue to monitor.

## 2013-01-08 NOTE — H&P (Signed)
[facility] History and Physical  [name]  FAO:[id number]  DOB: 10-09-1940   DOA: 01/08/2013   PCP:   Rudi Heap, MD   Chief Complaint:  Difficulty breathing today  HPI: [name] is a 72 y.o. female.  Elderly Caucasian lady with severe obesity, and multiple medical problems listed below including and obstructive sleep apnea, reports that her CPAP machine is malfunctioning and cannot be repaired till Monday. She is extremely short of breath and cannot sleep without and she called her primary care physician who advised that she come to the ER.  In the emergency room her shortness of breath was confirmed, she received nebulizers and the hospitalist service was called for assistance. Additionally chest x-ray was suggestive of pneumonia.  Patient denies worsening cough or fever.  At baseline patient has extreme dyspnea on exertion and can only go a few feet with her walker, but otherwise is mobile by wheelchair.  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 She reports she lost about 70 pounds 2 years ago but her weight has been stable since and she acknowledges that her diet is suboptimal, but feels she cannot eat vegetables because she is maintained on friend for paroxysmal A. Fib, and insist she has been told by her cardiologist that she should not eat  Vegetables.  She complains of intolerable itching and bumps around the outside of her vagina, and also itching on her back  Rewiew of Systems:   All systems negative except as marked bold or noted in the HPI;  Constitutional:    malaise, fever and chills. ;  Eyes:   eye pain, redness and discharge. ;  ENMT:   ear pain, hoarseness, nasal congestion, sinus pressure and sore throat. ;  Cardiovascular:    chest pain, palpitations,  diaphoresis, dyspnea and peripheral edema.  Respiratory:   cough, hemoptysis, wheezing and stridor. ;  Gastrointestinal:  nausea, vomiting, diarrhea, constipation, abdominal pain, melena, blood in stool, hematemesis, jaundice and rectal bleeding. unusual weight loss..   Genitourinary:    frequency, dysuria, incontinence,flank pain and hematuria; Musculoskeletal:   back pain and neck pain.  swelling and trauma.;  Skin: .  pruritus, rash, abrasions, bruising and skin lesion.; ulcerations dry skin Neuro:    headache, lightheadedness and neck stiffness.  weakness, altered level of consciousness, altered mental status, extremity weakness, burning feet, involuntary movement, seizure and syncope.  Psych:    anxiety, depression, insomnia, tearfulness, panic attacks, hallucinations, paranoia, suicidal or homicidal ideation    Past Medical History  Diagnosis Date  . Sleep apnea   . Morbid obesity   . Allergic rhinitis   . Diabetes mellitus     type 2  . Back pain   . Hypothyroidism   . CAD (coronary artery disease)   . Hyperlipemia   . Fibromyalgia   . Narcolepsy   . SVT (supraventricular tachycardia)     afib  . 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 Total abdominal hysterectomy    . Tubal ligation    . Appendectomy      Medications:  HOME MEDS: Prior to Admission medications   Medication Sig Start Date End Date Taking? Authorizing Provider  aspirin EC 81 MG tablet Take 81 mg by mouth daily  as needed for pain (for Cardiac Arythmia).   Yes Historical Provider, MD  clonazePAM (KLONOPIN) 1 MG tablet Take 0.5-1 mg by mouth 3 (three) times daily. 10/24/12  Yes Ernestina Penna, MD  docusate sodium (COLACE) 100 MG capsule Take 100 mg by mouth daily.    Yes Historical Provider, MD  fluticasone (FLONASE) 50 MCG/ACT nasal spray Place 2 sprays into the nose daily. 11/14/12  Yes Ernestina Penna, MD  furosemide (LASIX) 40 MG tablet Take  40 mg by mouth daily. 10/07/12  Yes Ernestina Penna, MD  insulin aspart (NOVOLOG) 100 UNIT/ML injection Inject 4-16 Units into the skin 3 (three) times daily before meals. Use per sliding scale:  87-120= 4 units 121-150= 6 units 151-200= 8 units 201-250= 10 units 251-300= 12 units 301-350= 14 units 351-400= 16 units Over 400--to call MD.   Yes Historical Provider, MD  insulin glargine (LANTUS) 100 UNIT/ML injection Inject 35-40 Units into the skin 2 (two) times daily. 40 units in the morning and 35 units at bedtime   Yes Historical Provider, MD  levothyroxine (SYNTHROID, LEVOTHROID) 75 MCG tablet Take 75 mcg by mouth daily.     Yes Historical Provider, MD  nitroGLYCERIN (NITROSTAT) 0.4 MG SL tablet Place 0.4 mg under the tongue every 5 (five) minutes as needed.     Yes Historical Provider, MD  nystatin (MYCOSTATIN) powder Apply topically as directed. As needed under breasts for irritation   Yes Historical Provider, MD  oxyCODONE-acetaminophen (PERCOCET) 10-325 MG per tablet Take 1 tablet by mouth 4 (four) times daily as needed for pain.  10/09/12  Yes Historical Provider, MD  polyethylene glycol powder (GLYCOLAX/MIRALAX) powder Take 17 g by mouth daily.   Yes Historical Provider, MD  potassium chloride (MICRO-K) 10 MEQ CR capsule Take 10 mEq by mouth daily.   Yes Historical Provider, MD  pravastatin (PRAVACHOL) 40 MG tablet Take 40 mg by mouth daily.    Yes Historical Provider, MD  sotalol (BETAPACE) 80 MG tablet Take 120 mg by mouth 2 (two) times daily. Takes one & one-half tablet twice daily   Yes Historical Provider, MD  Vitamin D, Ergocalciferol, (DRISDOL) 50000 UNITS CAPS Take 50,000 Units by mouth every 7 (seven) days.   Yes Historical Provider, MD  warfarin (COUMADIN) 5 MG tablet Take 2.5 mg by mouth daily.    Yes Historical Provider, MD     Allergies:  Allergies  Allergen Reactions  . Amitriptyline   . Codeine     REACTION: hyper  . Crestor (Rosuvastatin)   . Detrol (Tolterodine)      UNKNOWN TO PATIENT  . Lipitor (Atorvastatin)   . Lisinopril   . Penicillins     REACTION: itch    Social History:   reports that she quit smoking about 44 years ago. She does not have any smokeless tobacco history on file. She reports that she does not use illicit drugs. Her alcohol history is not on file.  Family History: Family History  Problem Relation Age of Onset  . Heart attack Father      Physical Exam: Filed Vitals:   01/07/13 2151 01/07/13 2213 01/07/13 2300 01/08/13 0045  BP: 158/87  136/94   Pulse: 71  61 63  Temp: 98 F (36.7 C)     TempSrc: Oral     Resp: 20  16 15   Height: 5\' 4"  (1.626 m)     Weight: 141.976 kg (313 lb)     SpO2: 100% 100% 100% 100%  Blood pressure 136/94, pulse 63, temperature 98 F (36.7 C), temperature source Oral, resp. rate 15, height 5\' 4"  (1.626 m), weight 141.976 kg (313 lb), SpO2 100.00%. Body mass index is 53.7 kg/(m^2).   GEN:  Pleasant but depressed-looking, morbidly obese Caucasian lady sitting up bed complaining of pain and difficulty breathing; cooperative with exam PSYCH:  alert and oriented x4;  both anxious and depressed; affect is depressed HEENT: Mucous membranes pink and anicteric; PERRLA; EOM intact; very thick neck Breasts:: Not examined CHEST WALL: No tenderness CHEST: Normal respiration, clear to auscultation bilaterally HEART: Regular rate and rhythm; no murmurs rubs or gallops BACK: No kyphosis no scoliosis; no CVA tenderness ABDOMEN: Obese, soft non-tender; no masses, no organomegaly,  huge pannus; no intertriginous candida. Rectal Exam: Not done EXTREMITIES:  age-appropriate arthropathy of the hands and knees; no edema; no ulcerations; dermopathy of both shins. Genitalia: No rash noted on the external vagina, but difficult to examine the entire perineum because of large amounts of intervening folds of fat. PULSES: 2+ and symmetric SKIN: Normal hydration no rash or ulceration CNS: Cranial nerves 2-12  grossly intact no focal lateralizing neurologic deficit   Labs on Admission:  Basic Metabolic Panel:  Recent Labs Lab 01/07/13 2210  NA 137  K 4.0  CL 96  CO2 31  GLUCOSE 262*  BUN 16  CREATININE 0.56  CALCIUM 9.5   Liver Function Tests: No results found for this basename: AST, ALT, ALKPHOS, BILITOT, PROT, ALBUMIN,  in the last 168 hours No results found for this basename: LIPASE, AMYLASE,  in the last 168 hours No results found for this basename: AMMONIA,  in the last 168 hours CBC:  Recent Labs Lab 01/07/13 2210  WBC 7.6  NEUTROABS 4.5  HGB 13.5  HCT 41.1  MCV 95.6  PLT 192   Cardiac Enzymes:  Recent Labs Lab 01/07/13 2219  TROPONINI <0.30   BNP: No components found with this basename: POCBNP,  D-dimer: No components found with this basename: D-DIMER,  CBG: No results found for this basename: GLUCAP,  in the last 168 hours  Radiological Exams on Admission: Dg Chest 2 View  01/07/2013   *RADIOLOGY REPORT*  Clinical Data: Shortness of breath, back pain, weakness  CHEST - 2 VIEW  Comparison: 05/22/2008; 09/11/2008Chest CT - 06/03/2008  Findings:  Examination is markedly degraded secondary to patient body habitus.  Grossly unchanged enlarged cardiac silhouette and mediastinal contours with atherosclerotic calcification within the thoracic aorta.  Apparent left basilar heterogeneous air space opacities. Trace left-sided effusion is not excluded.  No definite evidence of edema or pneumothorax.  Grossly unchanged bones.  IMPRESSION: Degraded examination with findings worrisome for left lower lung pneumonia.  A follow-up chest radiograph in 4 to 6 weeks after treatment is recommended to ensure resolution.   Original Report Authenticated By: Tacey Ruiz, MD    EKG: Independently reviewed. Sinus rhythm without ST segment changes   Assessment/Plan  Active Problems:   HYPOTHYROIDISM   DIABETES, TYPE 2   Morbid obesity   SUPRAVENTRICULAR TACHYCARDIA   ALLERGIC  RHINITIS   SLEEP APNEA   GAD (generalized anxiety disorder)   Depression   Chronic low back pain   Diabetic neuropathy   A-fib   Hypercapnia   CAP (community acquired pneumonia)     Motivation and readiness for weight loss intervention Does not appear at to make changes because of concern that change in diet will affect her Coumadin; and feel she is into much pain to do any  type of exercise; overall her depression seems to be her primary demotivating factor  PLAN: We'll admit this lady for respiratory support and empiric treatment of her community-acquired pneumonia; the only supporting evidence for this is an unclear chest x-ray, and her shortness of breath. She has no fever or leukocytosis or worsening cough. Will empirically give her 3 days of Levaquin, but may also consider CT scan for further evaluation.  Continue her psychotropic and pain medications Check thyroid function check hemoglobin A1c; Slightly reduce her Lantus while in hospital but give additional sliding scale coverage She can likely be discharged Monday after arrangements have been made to repair her CPAP machine  Other plans as per orders.  Code Status: Full code Family Communication: Plans discuss with patient Disposition Plan: Likely home    Timya Trimmer Nocturnist [facility] Pager [phone]   01/08/2013, 1:06 AM

## 2013-01-08 NOTE — Progress Notes (Signed)
Patient was admitted to the hospital earlier this morning by Dr. Orvan Falconer.  Patient seen and examined, database reviewed  She's been admitted with worsening shortness of breath and pneumonia. She is morbidly obese, has obstructive sleep apnea. Her CPAP machine has not been functional at home. [facility] with [facility]. Arrangements will need to be made in the morning to have her CPAP machine adjusted. We can likely transition to oral antibiotics tomorrow. Anticipate discharge home tomorrow.  [name]

## 2013-01-08 NOTE — Progress Notes (Signed)
ANTICOAGULATION CONSULT NOTE - Initial Consult  Pharmacy Consult for Coumadin (chronic PTA) Indication: atrial fibrillation  Allergies  Allergen Reactions  . Amitriptyline   . Codeine     REACTION: hyper  . Crestor (Rosuvastatin)   . Detrol (Tolterodine)     UNKNOWN TO PATIENT  . Lipitor (Atorvastatin)   . Lisinopril   . Penicillins     REACTION: itch    Patient Measurements: Height: 5\' 4"  (162.6 cm) Weight: 312 lb 13.3 oz (141.9 kg) IBW/kg (Calculated) : 54.7  Vital Signs: BP: 139/74 mmHg (06/29 0112) Pulse Rate: 73 (06/29 0112)  Labs:  Recent Labs  01/07/13 2210 01/07/13 2219 01/08/13 0518 01/08/13 0850  HGB 13.5  --  14.1  --   HCT 41.1  --  41.8  --   PLT 192  --  190  --   LABPROT  --   --   --  27.1*  INR  --   --   --  2.62*  CREATININE 0.56  --  0.54  --   TROPONINI  --  <0.30  --   --     Estimated Creatinine Clearance: 92.6 ml/min (by C-G formula based on Cr of 0.54).  Medical History: Past Medical History  Diagnosis Date  . Sleep apnea   . Morbid obesity   . Allergic rhinitis   . Diabetes mellitus     type 2  . Back pain   . Hypothyroidism   . CAD (coronary artery disease)   . Hyperlipemia   . Fibromyalgia   . Narcolepsy   . SVT (supraventricular tachycardia)     afib  . 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 aspirin EC 81 MG tablet Take 81 mg by mouth daily as needed for pain (for Cardiac Arythmia).      . clonazePAM (KLONOPIN) 1 MG tablet Take 0.5-1 mg by mouth 3 (three) times daily.      Marland Kitchen docusate sodium (COLACE) 100 MG capsule Take 100 mg by mouth daily.       . fluticasone (FLONASE) 50 MCG/ACT nasal spray Place 2 sprays into the nose daily.  1 g  2  . furosemide (LASIX) 40 MG tablet Take 40 mg by mouth daily.      . insulin aspart (NOVOLOG) 100 UNIT/ML injection Inject 4-16 Units into the skin 3 (three) times daily before meals. Use  per sliding scale:  87-120= 4 units 121-150= 6 units 151-200= 8 units 201-250= 10 units 251-300= 12 units 301-350= 14 units 351-400= 16 units Over 400--to call MD.      . insulin glargine (LANTUS) 100 UNIT/ML injection Inject 35-40 Units into the skin 2 (two) times daily. 40 units in the morning and 35 units at bedtime      . levothyroxine (SYNTHROID, LEVOTHROID) 75 MCG tablet Take 75 mcg by mouth daily.        . nitroGLYCERIN (NITROSTAT) 0.4 MG SL tablet Place 0.4 mg under the tongue every 5 (five) minutes as needed.        . nystatin (MYCOSTATIN) powder Apply topically as directed. As needed under breasts for irritation      . oxyCODONE-acetaminophen (PERCOCET) 10-325 MG per tablet Take 1 tablet by mouth 4 (four) times daily as needed for pain.       . polyethylene glycol powder (GLYCOLAX/MIRALAX) powder Take 17 g by mouth daily.      Marland Kitchen  potassium chloride (MICRO-K) 10 MEQ CR capsule Take 10 mEq by mouth daily.      . pravastatin (PRAVACHOL) 40 MG tablet Take 40 mg by mouth daily.       . sotalol (BETAPACE) 80 MG tablet Take 120 mg by mouth 2 (two) times daily. Takes one & one-half tablet twice daily      . Vitamin D, Ergocalciferol, (DRISDOL) 50000 UNITS CAPS Take 50,000 Units by mouth every 7 (seven) days.      Marland Kitchen warfarin (COUMADIN) 5 MG tablet Take 2.5 mg by mouth daily.         Assessment: 72yo female on chronic Coumadin PTA for h/o afib.  INR is therapeutic on admission.  Pt is also on aspirin. Goal of Therapy:  INR 2-3 Monitor platelets by anticoagulation protocol: Yes   Plan:  Coumadin 2.5mg  po daily at 1600 (home dose) INR daily for now  Valrie Hart A 01/08/2013,10:18 AM

## 2013-01-08 NOTE — Progress Notes (Signed)
Pt refuses to wear c-pap. States it is blowing to hard and she just can't stand it. Respiratory informed. Respiratory states she has her setting at a five and it should be at an eleven. O2 via [place] at 2L at this time.

## 2013-01-08 NOTE — Progress Notes (Signed)
Pt refuse to wear CPAP machine.Marland KitchenMarland KitchenMarland Kitchen

## 2013-01-08 NOTE — Progress Notes (Signed)
ANTIBIOTIC CONSULT NOTE - INITIAL  Pharmacy Consult for Levaquin Indication: rule out pneumonia  Allergies  Allergen Reactions  . Amitriptyline   . Codeine     REACTION: hyper  . Crestor (Rosuvastatin)   . Detrol (Tolterodine)     UNKNOWN TO PATIENT  . Lipitor (Atorvastatin)   . Lisinopril   . Penicillins     REACTION: itch    Patient Measurements: Height: 5\' 4"  (162.6 cm) Weight: 312 lb 13.3 oz (141.9 kg) IBW/kg (Calculated) : 54.7  Vital Signs: Temp: 98 F (36.7 C) (06/28 2151) Temp src: Oral (06/28 2151) BP: 139/74 mmHg (06/29 0112) Pulse Rate: 73 (06/29 0112) Intake/Output from previous day: 06/28 0701 - 06/29 0700 In: -  Out: 700 [Urine:700] Intake/Output from this shift:    Labs:  Recent Labs  01/07/13 2210 01/08/13 0518  WBC 7.6 6.9  HGB 13.5 14.1  PLT 192 190  CREATININE 0.56 0.54   Estimated Creatinine Clearance: 92.6 ml/min (by C-G formula based on Cr of 0.54). No results found for this basename: VANCOTROUGH, VANCOPEAK, VANCORANDOM, GENTTROUGH, GENTPEAK, GENTRANDOM, TOBRATROUGH, TOBRAPEAK, TOBRARND, AMIKACINPEAK, AMIKACINTROU, AMIKACIN,  in the last 72 hours   Microbiology: No results found for this or any previous visit (from the past 720 hour(s)).  Medical History: Past Medical History  Diagnosis Date  . Sleep apnea   . Morbid obesity   . Allergic rhinitis   . Diabetes mellitus     type 2  . Back pain   . Hypothyroidism   . CAD (coronary artery disease)   . Hyperlipemia   . Fibromyalgia   . Narcolepsy   . SVT (supraventricular tachycardia)     afib  . 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 clonazePAM  0.5 mg Oral TID  . [START ON 01/09/2013] fluconazole  100 mg Oral Daily  . fluticasone  2 spray Each Nare Daily  . furosemide  40 mg Oral Daily  . insulin aspart  0-15 Units Subcutaneous TID WC  . insulin aspart  0-5 Units Subcutaneous QHS  . insulin glargine  30 Units Subcutaneous Q12H  .  levofloxacin (LEVAQUIN) IV  750 mg Intravenous Q24H  . levothyroxine  75 mcg Oral QAC breakfast  . loratadine  10 mg Oral Daily  . nystatin   Topical BID  . polyethylene glycol  17 g Oral Daily  . potassium chloride  10 mEq Oral BID  . warfarin  2.5 mg Oral Daily   Assessment: 72yo obese female with good renal fxn.  Estimated Creatinine Clearance: 92.6 ml/min (by C-G formula based on Cr of 0.54). Pt admitted for SOB and suspected pna.  Goal of Therapy:  Eradicate infection.  Plan:  Levaquin 750mg  IV q24hrs Monitor labs, renal fxn, and cultures  Margo Aye, Damar Petit A 01/08/2013,8:52 AM

## 2013-01-08 NOTE — ED Provider Notes (Signed)
1200 Patient with a h/o sleep apnea, morbid obesity, DM, CAD, narcolepsy here with CPAP failure at night and shortness of breath. Awaiting CBC and BMET results for admission. 1252 Labs have returned. Reviewed. 12:52 AM:  T/C to Dr. Orvan Falconer,  hospitalist, case discussed, including:  HPI, pertinent PM/SHx, VS/PE, dx testing, ED course and treatment.  Agreeable to admission.  Requests to write temporary orders, telemetry bed to team 2.  Results for orders placed during the hospital encounter of 01/07/13  URINALYSIS, ROUTINE W REFLEX MICROSCOPIC      Result Value Range   Color, Urine STRAW (*) YELLOW   APPearance CLEAR  CLEAR   Specific Gravity, Urine <1.005 (*) 1.005 - 1.030   pH 6.0  5.0 - 8.0   Glucose, UA 100 (*) NEGATIVE mg/dL   Hgb urine dipstick SMALL (*) NEGATIVE   Bilirubin Urine NEGATIVE  NEGATIVE   Ketones, ur NEGATIVE  NEGATIVE mg/dL   Protein, ur NEGATIVE  NEGATIVE mg/dL   Urobilinogen, UA 0.2  0.0 - 1.0 mg/dL   Nitrite NEGATIVE  NEGATIVE   Leukocytes, UA NEGATIVE  NEGATIVE  TROPONIN I      Result Value Range   Troponin I <0.30  <0.30 ng/mL  BLOOD GAS, ARTERIAL      Result Value Range   O2 Content 2.0     Delivery systems NASAL CANNULA     pH, Arterial 7.392  7.350 - 7.450   pCO2 arterial 50.3 (*) 35.0 - 45.0 mmHg   pO2, Arterial 115.0 (*) 80.0 - 100.0 mmHg   Bicarbonate 29.9 (*) 20.0 - 24.0 mEq/L   TCO2 26.0  0 - 100 mmol/L   Acid-Base Excess 5.2 (*) 0.0 - 2.0 mmol/L   O2 Saturation 98.2     Patient temperature 37.0     Collection site LEFT RADIAL     Drawn by 21310     Sample type ARTERIAL     Allens test (pass/fail) PASS  PASS  URINE MICROSCOPIC-ADD ON      Result Value Range   Squamous Epithelial / LPF RARE  RARE   RBC / HPF 0-2  <3 RBC/hpf  CBC WITH DIFFERENTIAL      Result Value Range   WBC 7.6  4.0 - 10.5 K/uL   RBC 4.30  3.87 - 5.11 MIL/uL   Hemoglobin 13.5  12.0 - 15.0 g/dL   HCT 16.1  09.6 - 04.5 %   MCV 95.6  78.0 - 100.0 fL   MCH 31.4  26.0 -  34.0 pg   MCHC 32.8  30.0 - 36.0 g/dL   RDW 40.9  81.1 - 91.4 %   Platelets 192  150 - 400 K/uL   Neutrophils Relative % 59  43 - 77 %   Neutro Abs 4.5  1.7 - 7.7 K/uL   Lymphocytes Relative 28  12 - 46 %   Lymphs Abs 2.1  0.7 - 4.0 K/uL   Monocytes Relative 10  3 - 12 %   Monocytes Absolute 0.8  0.1 - 1.0 K/uL   Eosinophils Relative 2  0 - 5 %   Eosinophils Absolute 0.2  0.0 - 0.7 K/uL   Basophils Relative 0  0 - 1 %   Basophils Absolute 0.0  0.0 - 0.1 K/uL  BASIC METABOLIC PANEL      Result Value Range   Sodium 137  135 - 145 mEq/L   Potassium 4.0  3.5 - 5.1 mEq/L   Chloride 96  96 - 112  mEq/L   CO2 31  19 - 32 mEq/L   Glucose, Bld 262 (*) 70 - 99 mg/dL   BUN 16  6 - 23 mg/dL   Creatinine, Ser 1.61  0.50 - 1.10 mg/dL   Calcium 9.5  8.4 - 09.6 mg/dL   GFR calc non Af Amer >90  >90 mL/min   GFR calc Af Amer >90  >90 mL/min   Pt stable in ED with no significant deterioration in condition.The patient appears reasonably stabilized for admission considering the current resources, flow, and capabilities available in the ED at this time, and I doubt any other [facility] requiring further screening and/or treatment in the ED prior to admission.  MDM Reviewed: nursing note and vitals Interpretation: labs     Nicoletta Dress. Colon Branch, MD 01/08/13 [phone]

## 2013-01-08 NOTE — Progress Notes (Signed)
Pt's IV site leaking and she refuses to get another access. Pt states she wants antibiotics by mouth instead of by IV. MD made aware. Pt signs consent to leave IV access out and agrees to risks involved per MD request. She states she will be leaving in the morning and does not think she will need an IV.

## 2013-01-08 NOTE — Progress Notes (Signed)
Pt very non-complaint with wanting to wear CPAP machine. Pt c/o pressures being to high. Worked with pt in titrated pressures down to CPAP of 5 and she still complain that it was to high. I explained that it was as low as I could go for her and she said that she needed nose spray and that she wasn't going to be able to wear it. I went to do another pt treatment and return to see if she would wear and she agree to try to wear. She complained but attempted wear a CPAP of 5 for the time being. Will check back later.

## 2013-01-09 LAB — LEGIONELLA ANTIGEN, URINE

## 2013-01-09 LAB — BASIC METABOLIC PANEL
GFR calc Af Amer: 79 mL/min — ABNORMAL LOW (ref >90)
GFR calc non Af Amer: 68 mL/min — ABNORMAL LOW (ref >90)
Glucose, Bld: 197 mg/dL — ABNORMAL HIGH (ref 70–99)
Potassium: 4.1 mEq/L (ref 3.5–5.1)
Sodium: 140 mEq/L (ref 135–145)

## 2013-01-09 LAB — GLUCOSE, CAPILLARY

## 2013-01-09 LAB — CBC
Hemoglobin: 13.4 g/dL (ref 12.0–15.0)
MCHC: 32 g/dL (ref 30.0–36.0)

## 2013-01-09 LAB — PROTIME-INR
INR: 3.14 — ABNORMAL HIGH (ref 0.00–1.49)
Prothrombin Time: 31.1 seconds — ABNORMAL HIGH (ref 11.6–15.2)

## 2013-01-09 MED ORDER — LEVOFLOXACIN 750 MG PO TABS: 750.0000 mg | ORAL_TABLET | Freq: Every day | ORAL | Status: AC

## 2013-01-09 NOTE — Progress Notes (Signed)
Utilization Review Complete  

## 2013-01-09 NOTE — Progress Notes (Signed)
Lost Pt's IV site after Levaquin IV scanned. Not Given. Levaquin in blue bin in medicine room. MD aware.

## 2013-01-09 NOTE — Progress Notes (Signed)
Patient discharged with instructions and care notes.  Pt verbalized understanding.  She voiced by teach back that she is not take her Coumadin dose tonight.  Pt did not have an IV access to be removed.  Pt left the floor on our O2 and transferred to hers once she got to her car.  Pt left the floor via w/c in stable condition with staff and family.

## 2013-01-09 NOTE — Telephone Encounter (Signed)
Pt is in hospital, they will contact [facility] about CPAP

## 2013-01-09 NOTE — Discharge Summary (Signed)
Physician Discharge Summary  [name] OZH:[id number] DOB: [birth year] DOA: 01/07/2013  PCP: Rudi Heap, MD  Admit date: 01/07/2013 Discharge date: 01/09/2013  Time spent: 25 minutes  Recommendations for Outpatient Follow-up:  1. [facility], patient's home health agency, will followup with replacement machine today. 2. Patient is advised to skip tonight's dose (6/30)  of Coumadin secondary to fluoroquinolones interaction. 3. Patient will follow up with her PCP next week for Coumadin level check.  Discharge Diagnoses:  Principal Problem:   CAP (community acquired pneumonia) Active Problems:   HYPOTHYROIDISM   DIABETES, TYPE 2   Morbid obesity   SUPRAVENTRICULAR TACHYCARDIA   ALLERGIC RHINITIS   SLEEP APNEA   GAD (generalized anxiety disorder)   Depression   Chronic low back pain   Diabetic neuropathy   A-fib   Hypercapnia   Chronic respiratory failure   Discharge Condition: Improved, being discharged home  Diet recommendation: Heart healthy, carb modified  Filed Weights   01/07/13 2151 01/08/13 0500 01/09/13 0500  Weight: 141.976 kg (313 lb) 141.9 kg (312 lb 13.3 oz) 141.9 kg (312 lb 13.3 oz)    History of present illness:  [name] is a 72 y.o. female. Elderly Caucasian lady with severe obesity, and multiple medical problems listed below including and obstructive sleep apnea, reports that her CPAP machine is malfunctioning and cannot be repaired till Monday. She is extremely short of breath and cannot sleep without and she called her primary care physician who advised that she come to the ER.  In the emergency room her shortness of breath was confirmed, she received nebulizers and the hospitalist service was called for assistance. Additionally chest x-ray was suggestive of pneumonia.  She complains of intolerable itching and bumps around the outside of her vagina, and also itching on her back   Hospital Course:  Principal Problem:   CAP  (community acquired pneumonia): Patient was started on Levaquin. She will be discharged on 4 more days for total of 5 days of therapy. Oxygen levels were at her baseline. Active Problems:   HYPOTHYROIDISM: Stable continue on Synthroid    DIABETES, TYPE 2: Stable. Continued on home doses of insulin plus sliding scale.    Morbid obesity   SUPRAVENTRICULAR TACHYCARDIA   ALLERGIC RHINITIS   SLEEP APNEA: Patient presented because her CPAP she was broken. Her home health agency has been notified and they will followup with replacement machine today.    GAD (generalized anxiety disorder)   Depression   Chronic low back pain   Diabetic neuropathy   A-fib: Currently normal sinus rhythm. Stable. Her INR went from 2.62 yesterday to 3.14 today. Suspect main cause is from Levaquin. Given need for 4 more days of Levaquin, have advised the patient she will skip tonight status of Coumadin.   Hypercapnia   Chronic respiratory failure   Procedures:  None  Consultations:  None  Discharge Exam: Filed Vitals:   01/08/13 0901 01/08/13 1332 01/08/13 2100 01/09/13 0500  BP:  135/96 145/50   Pulse:  74 70   Temp:  97.6 F (36.4 C) 97.5 F (36.4 C)   TempSrc:   Oral   Resp:  18 20   Height:      Weight:    141.9 kg (312 lb 13.3 oz)  SpO2: 96% 95% 100%     General: Alert and oriented x3, flattened affect, no acute distress Cardiovascular: Soft, regular rate and rhythm, S1-S2 Respiratory: Decreased breath sounds throughout, more so at the bases  Discharge Instructions  Discharge Orders   Future Appointments Provider Department Dept Phone   01/17/2013 2:00 PM Deatra Canter, FNP [facility] [phone]   01/26/2013 3:00 PM Ernestina Penna, MD [facility] [phone]   Future Orders Complete By Expires     Diet - low sodium heart healthy  As directed     Diet Carb Modified  As directed     Increase activity slowly  As directed          Medication List         aspirin EC 81 MG tablet  Take 81 mg by mouth daily as needed for pain (for Cardiac Arythmia).     clonazePAM 1 MG tablet  Commonly known as:  KLONOPIN  Take 0.5-1 mg by mouth 3 (three) times daily.     docusate sodium 100 MG capsule  Commonly known as:  COLACE  Take 100 mg by mouth daily.     fluticasone 50 MCG/ACT nasal spray  Commonly known as:  FLONASE  Place 2 sprays into the nose daily.     furosemide 40 MG tablet  Commonly known as:  LASIX  Take 40 mg by mouth daily.     insulin aspart 100 UNIT/ML injection  Commonly known as:  novoLOG  - Inject 4-16 Units into the skin 3 (three) times daily before meals. Use per sliding scale:   - 87-120= 4 units  - 121-150= 6 units  - 151-200= 8 units  - 201-250= 10 units  - 251-300= 12 units  - 301-350= 14 units  - 351-400= 16 units  - Over 400--to call MD.     insulin glargine 100 UNIT/ML injection  Commonly known as:  LANTUS  Inject 35-40 Units into the skin 2 (two) times daily. 40 units in the morning and 35 units at bedtime     levofloxacin 750 MG tablet  Commonly known as:  LEVAQUIN  Take 1 tablet (750 mg total) by mouth at bedtime.     levothyroxine 75 MCG tablet  Commonly known as:  SYNTHROID, LEVOTHROID  Take 75 mcg by mouth daily.     nitroGLYCERIN 0.4 MG SL tablet  Commonly known as:  NITROSTAT  Place 0.4 mg under the tongue every 5 (five) minutes as needed.     nystatin powder  Commonly known as:  MYCOSTATIN  Apply topically as directed. As needed under breasts for irritation     oxyCODONE-acetaminophen 10-325 MG per tablet  Commonly known as:  PERCOCET  Take 1 tablet by mouth 4 (four) times daily as needed for pain.     polyethylene glycol powder powder  Commonly known as:  GLYCOLAX/MIRALAX  Take 17 g by mouth daily.     potassium chloride 10 MEQ CR capsule  Commonly known as:  MICRO-K  Take 10 mEq by mouth daily.     pravastatin 40 MG tablet  Commonly known as:   PRAVACHOL  Take 40 mg by mouth daily.     sotalol 80 MG tablet  Commonly known as:  BETAPACE  Take 120 mg by mouth 2 (two) times daily. Takes one & one-half tablet twice daily     Vitamin D (Ergocalciferol) 50000 UNITS Caps  Commonly known as:  DRISDOL  Take 50,000 Units by mouth every 7 (seven) days.     warfarin 5 MG tablet  Commonly known as:  COUMADIN  Take 2.5 mg by mouth daily.       Allergies  Allergen  Reactions  . Amitriptyline   . Codeine     REACTION: hyper  . Crestor (Rosuvastatin)   . Detrol (Tolterodine)     UNKNOWN TO PATIENT  . Lipitor (Atorvastatin)   . Lisinopril   . Penicillins     REACTION: itch       Follow-up Information   Follow up with Rudi Heap, MD. Schedule an appointment as soon as possible for a visit in 1 week. (Have your coumadin level checked in 1 week as needed    Patient has a follow up appointment with Ander Slade on  Tuesday July 8,2014 at 1:45. Dr. Christell Constant will be on vacation.)    Contact information:   [address] [place] [address] [phone]        The results of significant diagnostics from this hospitalization (including imaging, microbiology, ancillary and laboratory) are listed below for reference.    Significant Diagnostic Studies: Dg Chest 2 View  01/07/2013     IMPRESSION: Degraded examination with findings worrisome for left lower lung pneumonia.  A follow-up chest radiograph in 4 to 6 weeks after treatment is recommended to ensure resolution.   Original Report Authenticated By: Tacey Ruiz, MD     Labs: Basic Metabolic Panel:  Recent Labs Lab 01/07/13 2210 01/08/13 0518 01/09/13 0411  NA 137 140 140  K 4.0 3.9 4.1  CL 96 100 99  CO2 31 34* 33*  GLUCOSE 262* 186* 197*  BUN 16 13 19   CREATININE 0.56 0.54 0.85  CALCIUM 9.5 9.6 9.3   Liver Function Tests:  Recent Labs Lab 01/08/13 0518  AST 19  ALT 14  ALKPHOS 95  BILITOT 0.4  PROT 7.5  ALBUMIN 3.2*   CBC:  Recent Labs Lab  01/07/13 2210 01/08/13 0518 01/09/13 0411  WBC 7.6 6.9 7.7  NEUTROABS 4.5  --   --   HGB 13.5 14.1 13.4  HCT 41.1 41.8 41.9  MCV 95.6 93.3 97.9  PLT 192 190 194   Cardiac Enzymes:  Recent Labs Lab 01/07/13 2219  TROPONINI <0.30   BNP: BNP (last 3 results)  Recent Labs  01/08/13 0517  PROBNP 340.3*   CBG:  Recent Labs Lab 01/08/13 1151 01/08/13 1602 01/08/13 1932 01/08/13 2150 01/09/13 0803  GLUCAP 237* 244* 224* 205* 203*       Signed:  Virginia Rochester K  [facility] 01/09/2013, 9:42 AM

## 2013-01-09 NOTE — Progress Notes (Signed)
ANTICOAGULATION CONSULT NOTE   Pharmacy Consult for Coumadin (chronic PTA) Indication: atrial fibrillation  Allergies  Allergen Reactions  . Amitriptyline   . Codeine     REACTION: hyper  . Crestor (Rosuvastatin)   . Detrol (Tolterodine)     UNKNOWN TO PATIENT  . Lipitor (Atorvastatin)   . Lisinopril   . Penicillins     REACTION: itch   Patient Measurements: Height: 5\' 4"  (162.6 cm) Weight: 312 lb 13.3 oz (141.9 kg) IBW/kg (Calculated) : 54.7  Vital Signs:    Labs:  Recent Labs  01/07/13 2210 01/07/13 2219 01/08/13 0518 01/08/13 0850 01/09/13 0411  HGB 13.5  --  14.1  --  13.4  HCT 41.1  --  41.8  --  41.9  PLT 192  --  190  --  194  LABPROT  --   --   --  27.1* 31.1*  INR  --   --   --  2.62* 3.14*  CREATININE 0.56  --  0.54  --  0.85  TROPONINI  --  <0.30  --   --   --     Estimated Creatinine Clearance: 87.1 ml/min (by C-G formula based on Cr of 0.85).  Medical History: Past Medical History  Diagnosis Date  . Sleep apnea   . Morbid obesity   . Allergic rhinitis   . Diabetes mellitus     type 2  . Back pain   . Hypothyroidism   . CAD (coronary artery disease)   . Hyperlipemia   . Fibromyalgia   . Narcolepsy   . SVT (supraventricular tachycardia)     afib  . 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 aspirin EC 81 MG tablet Take 81 mg by mouth daily as needed for pain (for Cardiac Arythmia).      . clonazePAM (KLONOPIN) 1 MG tablet Take 0.5-1 mg by mouth 3 (three) times daily.      Marland Kitchen docusate sodium (COLACE) 100 MG capsule Take 100 mg by mouth daily.       . fluticasone (FLONASE) 50 MCG/ACT nasal spray Place 2 sprays into the nose daily.  1 g  2  . furosemide (LASIX) 40 MG tablet Take 40 mg by mouth daily.      . insulin aspart (NOVOLOG) 100 UNIT/ML injection Inject 4-16 Units into the skin 3 (three) times daily before meals. Use per sliding scale:  87-120=  4 units 121-150= 6 units 151-200= 8 units 201-250= 10 units 251-300= 12 units 301-350= 14 units 351-400= 16 units Over 400--to call MD.      . insulin glargine (LANTUS) 100 UNIT/ML injection Inject 35-40 Units into the skin 2 (two) times daily. 40 units in the morning and 35 units at bedtime      . levothyroxine (SYNTHROID, LEVOTHROID) 75 MCG tablet Take 75 mcg by mouth daily.        . nitroGLYCERIN (NITROSTAT) 0.4 MG SL tablet Place 0.4 mg under the tongue every 5 (five) minutes as needed.        . nystatin (MYCOSTATIN) powder Apply topically as directed. As needed under breasts for irritation      . oxyCODONE-acetaminophen (PERCOCET) 10-325 MG per tablet Take 1 tablet by mouth 4 (four) times daily as needed for pain.       . polyethylene glycol powder (GLYCOLAX/MIRALAX) powder Take 17 g by mouth daily.      Marland Kitchen  potassium chloride (MICRO-K) 10 MEQ CR capsule Take 10 mEq by mouth daily.      . pravastatin (PRAVACHOL) 40 MG tablet Take 40 mg by mouth daily.       . sotalol (BETAPACE) 80 MG tablet Take 120 mg by mouth 2 (two) times daily. Takes one & one-half tablet twice daily      . Vitamin D, Ergocalciferol, (DRISDOL) 50000 UNITS CAPS Take 50,000 Units by mouth every 7 (seven) days.      Marland Kitchen warfarin (COUMADIN) 5 MG tablet Take 2.5 mg by mouth daily.        Assessment: 72yo female on chronic Coumadin PTA for h/o afib.  INR  therapeutic on admission but has now trended up above goal range.  Pt is also on aspirin. Goal of Therapy:  INR 2-3 Monitor platelets by anticoagulation protocol: Yes   Plan:  Hold coumadin today INR daily for now  Valrie Hart A 01/09/2013,9:13 AM

## 2013-01-09 NOTE — Progress Notes (Signed)
UR Chart Review Completed  

## 2013-01-10 ENCOUNTER — Telehealth: Payer: Self-pay | Admitting: Family Medicine

## 2013-01-10 ENCOUNTER — Other Ambulatory Visit: Payer: Self-pay | Admitting: Family Medicine

## 2013-01-10 LAB — URINE CULTURE
Colony Count: NO GROWTH
Culture: NO GROWTH

## 2013-01-10 NOTE — Telephone Encounter (Signed)
Spoke with pt [facility] did come to set up auto titration CPAP

## 2013-01-11 ENCOUNTER — Telehealth: Payer: Self-pay | Admitting: Family Medicine

## 2013-01-11 ENCOUNTER — Telehealth: Payer: Self-pay | Admitting: *Deleted

## 2013-01-11 ENCOUNTER — Ambulatory Visit (INDEPENDENT_AMBULATORY_CARE_PROVIDER_SITE_OTHER): Payer: Medicare Other | Admitting: Pharmacist

## 2013-01-11 ENCOUNTER — Other Ambulatory Visit: Payer: Self-pay | Admitting: *Deleted

## 2013-01-11 DIAGNOSIS — I4891 Unspecified atrial fibrillation: Secondary | ICD-10-CM

## 2013-01-11 LAB — POCT INR: INR: 3.8

## 2013-01-11 MED ORDER — OXYBUTYNIN CHLORIDE 5 MG PO TABS: 5.0000 mg | ORAL_TABLET | Freq: Two times a day (BID) | ORAL | Status: DC

## 2013-01-11 NOTE — Telephone Encounter (Signed)
NA

## 2013-01-11 NOTE — Telephone Encounter (Signed)
This is okay to try oxybutynin and 2 refill nitroglycerin

## 2013-01-11 NOTE — Progress Notes (Signed)
Patient has been taking levaquin and fluconazole.  INR is supratherapeutic.  Hold for today, then decrease dose to 1/2 tablet daily except none on wednesdays.  Recheck protime in Monday, July 7th

## 2013-01-11 NOTE — Telephone Encounter (Signed)
Pt having worsening incontinence Would like to try Oxtbutinin Also needs refill on Nitroglycerin Please call to advise

## 2013-01-16 ENCOUNTER — Ambulatory Visit (INDEPENDENT_AMBULATORY_CARE_PROVIDER_SITE_OTHER): Payer: Medicare Other | Admitting: Pharmacist

## 2013-01-16 DIAGNOSIS — I4891 Unspecified atrial fibrillation: Secondary | ICD-10-CM

## 2013-01-17 ENCOUNTER — Ambulatory Visit: Payer: Medicare Other | Admitting: Family Medicine

## 2013-01-17 ENCOUNTER — Telehealth: Payer: Self-pay | Admitting: Family Medicine

## 2013-01-23 ENCOUNTER — Ambulatory Visit (INDEPENDENT_AMBULATORY_CARE_PROVIDER_SITE_OTHER): Payer: Medicare Other | Admitting: Pharmacist

## 2013-01-23 DIAGNOSIS — I4891 Unspecified atrial fibrillation: Secondary | ICD-10-CM

## 2013-01-23 LAB — POCT INR: INR: 1.6

## 2013-01-26 ENCOUNTER — Ambulatory Visit (INDEPENDENT_AMBULATORY_CARE_PROVIDER_SITE_OTHER): Payer: Medicare Other | Admitting: Family Medicine

## 2013-01-26 ENCOUNTER — Encounter: Payer: Self-pay | Admitting: Family Medicine

## 2013-01-26 VITALS — BP 157/72 | HR 65 | Temp 97.1°F | Wt 314.6 lb

## 2013-01-26 DIAGNOSIS — I4891 Unspecified atrial fibrillation: Secondary | ICD-10-CM

## 2013-01-26 DIAGNOSIS — M545 Low back pain, unspecified: Secondary | ICD-10-CM

## 2013-01-26 DIAGNOSIS — E1142 Type 2 diabetes mellitus with diabetic polyneuropathy: Secondary | ICD-10-CM

## 2013-01-26 DIAGNOSIS — E119 Type 2 diabetes mellitus without complications: Secondary | ICD-10-CM

## 2013-01-26 DIAGNOSIS — E785 Hyperlipidemia, unspecified: Secondary | ICD-10-CM

## 2013-01-26 DIAGNOSIS — E559 Vitamin D deficiency, unspecified: Secondary | ICD-10-CM

## 2013-01-26 DIAGNOSIS — F411 Generalized anxiety disorder: Secondary | ICD-10-CM

## 2013-01-26 DIAGNOSIS — F3289 Other specified depressive episodes: Secondary | ICD-10-CM

## 2013-01-26 DIAGNOSIS — R5381 Other malaise: Secondary | ICD-10-CM

## 2013-01-26 DIAGNOSIS — E1149 Type 2 diabetes mellitus with other diabetic neurological complication: Secondary | ICD-10-CM

## 2013-01-26 DIAGNOSIS — R5383 Other fatigue: Secondary | ICD-10-CM

## 2013-01-26 DIAGNOSIS — E1140 Type 2 diabetes mellitus with diabetic neuropathy, unspecified: Secondary | ICD-10-CM

## 2013-01-26 DIAGNOSIS — G8929 Other chronic pain: Secondary | ICD-10-CM

## 2013-01-26 DIAGNOSIS — E039 Hypothyroidism, unspecified: Secondary | ICD-10-CM

## 2013-01-26 DIAGNOSIS — R079 Chest pain, unspecified: Secondary | ICD-10-CM

## 2013-01-26 DIAGNOSIS — F329 Major depressive disorder, single episode, unspecified: Secondary | ICD-10-CM

## 2013-01-26 DIAGNOSIS — I10 Essential (primary) hypertension: Secondary | ICD-10-CM

## 2013-01-26 LAB — BASIC METABOLIC PANEL WITH GFR
BUN: 12 mg/dL (ref 6–23)
CO2: 31 mEq/L (ref 19–32)
Calcium: 9.4 mg/dL (ref 8.4–10.5)
Creat: 0.67 mg/dL (ref 0.50–1.10)
GFR, Est Non African American: 89 mL/min

## 2013-01-26 LAB — POCT GLYCOSYLATED HEMOGLOBIN (HGB A1C): Hemoglobin A1C: 7.5

## 2013-01-26 LAB — HEPATIC FUNCTION PANEL
Alkaline Phosphatase: 82 U/L (ref 39–117)
Bilirubin, Direct: 0.1 mg/dL (ref 0.0–0.3)
Indirect Bilirubin: 0.5 mg/dL (ref 0.0–0.9)
Total Bilirubin: 0.6 mg/dL (ref 0.3–1.2)

## 2013-01-26 LAB — POCT CBC
Granulocyte percent: 62.4 %G (ref 37–80)
HCT, POC: 41.3 % (ref 37.7–47.9)
Hemoglobin: 14.4 g/dL (ref 12.2–16.2)
MCHC: 35 g/dL (ref 31.8–35.4)
POC Granulocyte: 5 (ref 2–6.9)
RBC: 4.4 M/uL (ref 4.04–5.48)

## 2013-01-26 MED ORDER — DULOXETINE HCL 30 MG PO CPEP: 30.0000 mg | ORAL_CAPSULE | Freq: Every day | ORAL | Status: DC

## 2013-01-26 NOTE — Addendum Note (Signed)
Addended by: Bearl Mulberry on: 01/26/2013 05:32 PM   Modules accepted: Orders

## 2013-01-26 NOTE — Addendum Note (Signed)
Addended by: Bearl Mulberry on: 01/26/2013 05:23 PM   Modules accepted: Orders

## 2013-01-26 NOTE — Patient Instructions (Addendum)
Get started on an antidepressant, preferably Cymbalta 30 THN visit the patient at home and do an assessment  Patient must stay on the breathing  equipment people to get her CPAP machine upgrade Continue to monitor blood sugars Continue aggressive therapeutic lifestyle change Try to get an early appointment at [facility] with Dr. Sharol Harness Monitor protimes regularly Leave off aspirin Continue O2

## 2013-01-26 NOTE — Progress Notes (Signed)
Subjective:    Patient ID: [name], female    DOB: Jan 13, 1941, 72 y.o.   MRN: [id number]  HPI Patient comes in today for followup and management of chronic medical problems. These include morbid obesity, hypothyroidism, congestive heart failure, fibromyalgia, depression, insulin-dependent diabetes, and atrial fibrillation. She is tearful and very depressed today and could not complete review of systems due to being so upset. She is not taking any antidepressant at this time. I just completed a bunch of paper work with my signature were to help her get medication at a cheaper price through the county. In the past 3-4 weeks the patient was hospitalized for pneumonia. A few days later she had to be rehospitalized at [facility] for continuing pneumonia and a drop in her blood pressure and her heart rate. Says she is in line to get a no other CPAP machine that has been recommended for her but that has not been implemented yet. Patient says her mom just passed away and that was primarily Alzheimer's . Her brother also died from abdominal adhesions and infection, he was 70 years old. Patient still lives by herself at home. She has 2 sons and a daughter but has minimal contact with them.     Review of Systems  Constitutional: Positive for fatigue (extreme).  HENT: Negative.   Eyes: Negative.   Respiratory: Positive for shortness of breath.   Cardiovascular: Positive for chest pain (heaviness) and palpitations.  Psychiatric/Behavioral: Positive for sleep disturbance. The patient is nervous/anxious (increased, depression).        Objective:   Physical Exam BP 157/72  Pulse 65  Temp(Src) 97.1 F (36.2 C) (Oral)  Wt 314 lb 9.6 oz (142.702 kg)  BMI 53.97 kg/m2  The patient appeared morbidly obese, alert and oriented to time and place but extremely tearful and crying. Speech, behavior and judgement appear normal in the midst of all the crying. Vital signs as documented.   Head exam is unremarkable. No scleral icterus or pallor noted. Ears nose and throat were within normal limits  Neck is without jugular venous distension, thyromegally, or carotid bruits. Carotid upstrokes are brisk bilaterally. No cervical adenopathy. Lungs are clear anteriorly and posteriorly to auscultation with somewhat diminished breath sounds. Normal respiratory effort. She was using her O2 canister. Cardiac exam reveals regular rate and rhythm at 72 per minute. Heart sounds were distant.  No murmurs, rubs or gallops.  Abdominal exam reveals normal bowl sounds, no masses, no organomegaly and no aortic enlargement palpable. Abdomen is morbidly obese and this exam was difficult.  Extremities are nonedematous and both  pedal pulses were diminished. Skin on her feet was very dry but no sign of any infection Skin without pallor or jaundice.  Warm and dry, without rash. Neurologic exam reveals normal sensation. Diabetic foot exam was done          Assessment & Plan:  1. GAD (generalized anxiety disorder) -This is severe and she needs immediate treatment  2. Depression -This is severe and she needs immediate treatment  3. Atrial fibrillation -Continue to anticoagulate  4. Chronic low back pain  5. Diabetic neuropathy  6. Type 2 diabetes mellitus -Continue current treatment and monitor her blood sugars regularly  7. Hypothyroid  8. Hypertension  9. Hyperlipemia  Labs to monitor above problems will be drawn today  Patient Instructions  Get started on an antidepressant, preferably Cymbalta 30 THN visit the patient at home and do an assessment  Patient must stay  on the breathing  equipment people to get her CPAP machine upgrade Continue to monitor blood sugars Continue aggressive therapeutic lifestyle change Try to get an early appointment at [facility] with Dr. Sharol Harness Monitor protimes regularly Leave off aspirin Continue O2   Nyra Capes  MD

## 2013-01-27 LAB — THYROID PANEL WITH TSH
Free Thyroxine Index: 3.2 (ref 1.0–3.9)
T3 Uptake: 29.5 % (ref 22.5–37.0)
TSH: 1.451 u[IU]/mL (ref 0.350–4.500)

## 2013-01-27 LAB — NMR LIPOPROFILE WITH LIPIDS
LDL (calc): 124 mg/dL — ABNORMAL HIGH (ref <100)
LDL Particle Number: 2135 nmol/L — ABNORMAL HIGH (ref <1000)
LDL Size: 20.5 nm — ABNORMAL LOW (ref >20.5)
LP-IR Score: 79 — ABNORMAL HIGH (ref <=45)
Large VLDL-P: 3.8 nmol/L — ABNORMAL HIGH (ref <=2.7)
Small LDL Particle Number: 1254 nmol/L — ABNORMAL HIGH (ref <=527)
VLDL Size: 54.6 nm — ABNORMAL HIGH (ref <=46.6)

## 2013-01-30 ENCOUNTER — Other Ambulatory Visit: Payer: Self-pay | Admitting: *Deleted

## 2013-01-30 MED ORDER — PRAVASTATIN SODIUM 40 MG PO TABS: 40.0000 mg | ORAL_TABLET | Freq: Every day | ORAL | Status: DC

## 2013-01-30 MED ORDER — POTASSIUM CHLORIDE ER 10 MEQ PO CPCR: 10.0000 meq | ORAL_CAPSULE | Freq: Every day | ORAL | Status: DC

## 2013-01-30 MED ORDER — DULOXETINE HCL 30 MG PO CPEP: 30.0000 mg | ORAL_CAPSULE | Freq: Every day | ORAL | Status: DC

## 2013-01-30 MED ORDER — VITAMIN D (ERGOCALCIFEROL) 1.25 MG (50000 UNIT) PO CAPS: 50000.0000 [IU] | ORAL_CAPSULE | ORAL | Status: DC

## 2013-01-30 MED ORDER — FLUTICASONE PROPIONATE 50 MCG/ACT NA SUSP: 2.0000 | Freq: Every day | NASAL | Status: DC

## 2013-01-30 MED ORDER — WARFARIN SODIUM 5 MG PO TABS: 2.5000 mg | ORAL_TABLET | Freq: Every day | ORAL | Status: DC

## 2013-01-30 MED ORDER — CLONAZEPAM 1 MG PO TABS: 0.5000 mg | ORAL_TABLET | Freq: Three times a day (TID) | ORAL | Status: DC

## 2013-01-30 MED ORDER — LEVOTHYROXINE SODIUM 75 MCG PO TABS: 75.0000 ug | ORAL_TABLET | Freq: Every day | ORAL | Status: DC

## 2013-02-02 ENCOUNTER — Ambulatory Visit (INDEPENDENT_AMBULATORY_CARE_PROVIDER_SITE_OTHER): Payer: Medicare Other | Admitting: Pharmacist

## 2013-02-02 DIAGNOSIS — I4891 Unspecified atrial fibrillation: Secondary | ICD-10-CM

## 2013-02-02 LAB — POCT INR: INR: 2.1

## 2013-02-06 ENCOUNTER — Telehealth: Payer: Self-pay | Admitting: Family Medicine

## 2013-02-07 NOTE — Telephone Encounter (Signed)
NA

## 2013-02-08 NOTE — Telephone Encounter (Signed)
Pt notified to take in the AM

## 2013-02-15 ENCOUNTER — Ambulatory Visit (INDEPENDENT_AMBULATORY_CARE_PROVIDER_SITE_OTHER): Payer: Self-pay | Admitting: Pharmacist

## 2013-02-15 DIAGNOSIS — I4891 Unspecified atrial fibrillation: Secondary | ICD-10-CM

## 2013-02-16 ENCOUNTER — Telehealth: Payer: Self-pay | Admitting: Family Medicine

## 2013-02-16 ENCOUNTER — Other Ambulatory Visit: Payer: Self-pay | Admitting: Family Medicine

## 2013-02-16 MED ORDER — PRAVASTATIN SODIUM 80 MG PO TABS: 80.0000 mg | ORAL_TABLET | Freq: Every day | ORAL | Status: DC

## 2013-02-16 NOTE — Telephone Encounter (Signed)
Pt called and stated she was supposed to be taking Pravastatin 40mg  qid After reviewing the note, she was supposed to increase to Pravastatin 80mg  a day RX sent into pharmacy for Pravastatin 80mg  ONE daily Pt notified Also printed med list and mailed to patient

## 2013-02-17 ENCOUNTER — Telehealth: Payer: Self-pay | Admitting: Family Medicine

## 2013-02-17 ENCOUNTER — Other Ambulatory Visit: Payer: Self-pay | Admitting: Nurse Practitioner

## 2013-02-17 MED ORDER — GLUCOSE BLOOD VI STRP: ORAL_STRIP | Status: AC

## 2013-02-17 NOTE — Telephone Encounter (Signed)
Patient aware will call back

## 2013-02-17 NOTE — Telephone Encounter (Signed)
That monitor is not in epic- so will have to handwrite rx to be picked up or we can give her a monitor from here ad someone can pick it up

## 2013-02-27 ENCOUNTER — Telehealth: Payer: Self-pay | Admitting: Family Medicine

## 2013-02-27 MED ORDER — SOTALOL HCL 80 MG PO TABS: 120.0000 mg | ORAL_TABLET | Freq: Two times a day (BID) | ORAL | Status: DC

## 2013-02-27 NOTE — Telephone Encounter (Signed)
x

## 2013-02-28 ENCOUNTER — Other Ambulatory Visit: Payer: Self-pay

## 2013-02-28 MED ORDER — LEVOTHYROXINE SODIUM 75 MCG PO TABS: 75.0000 ug | ORAL_TABLET | Freq: Every day | ORAL | Status: DC

## 2013-02-28 MED ORDER — PRAVASTATIN SODIUM 80 MG PO TABS: 80.0000 mg | ORAL_TABLET | Freq: Every day | ORAL | Status: DC

## 2013-02-28 MED ORDER — POTASSIUM CHLORIDE ER 10 MEQ PO CPCR: 10.0000 meq | ORAL_CAPSULE | Freq: Every day | ORAL | Status: DC

## 2013-02-28 MED ORDER — FLUTICASONE PROPIONATE 50 MCG/ACT NA SUSP: 2.0000 | Freq: Every day | NASAL | Status: DC

## 2013-02-28 MED ORDER — SOTALOL HCL 80 MG PO TABS: 120.0000 mg | ORAL_TABLET | Freq: Two times a day (BID) | ORAL | Status: DC

## 2013-02-28 NOTE — Telephone Encounter (Signed)
Patient will call back and and let us know if she wants to be seen or not her leg is doing better today

## 2013-03-01 ENCOUNTER — Other Ambulatory Visit: Payer: Self-pay | Admitting: *Deleted

## 2013-03-01 MED ORDER — OXYBUTYNIN CHLORIDE 5 MG PO TABS: 5.0000 mg | ORAL_TABLET | Freq: Two times a day (BID) | ORAL | Status: DC

## 2013-03-02 ENCOUNTER — Ambulatory Visit: Payer: Self-pay | Admitting: Pharmacist

## 2013-03-02 DIAGNOSIS — I4891 Unspecified atrial fibrillation: Secondary | ICD-10-CM

## 2013-03-07 ENCOUNTER — Telehealth: Payer: Self-pay | Admitting: Family Medicine

## 2013-03-09 ENCOUNTER — Ambulatory Visit (INDEPENDENT_AMBULATORY_CARE_PROVIDER_SITE_OTHER): Payer: Medicare Other | Admitting: Pharmacist

## 2013-03-09 DIAGNOSIS — I4891 Unspecified atrial fibrillation: Secondary | ICD-10-CM

## 2013-03-09 LAB — POCT INR: INR: 2.3

## 2013-03-17 NOTE — Telephone Encounter (Signed)
Med was fixed and spoke with [facility] and pt - [id number]-jhb

## 2013-03-20 ENCOUNTER — Ambulatory Visit: Payer: Self-pay | Admitting: Pharmacist

## 2013-03-20 DIAGNOSIS — I4891 Unspecified atrial fibrillation: Secondary | ICD-10-CM

## 2013-03-23 ENCOUNTER — Ambulatory Visit: Payer: Medicare Other | Admitting: Pharmacist

## 2013-03-23 ENCOUNTER — Ambulatory Visit: Payer: Medicare Other | Admitting: Family Medicine

## 2013-03-23 ENCOUNTER — Telehealth: Payer: Self-pay | Admitting: Family Medicine

## 2013-03-23 DIAGNOSIS — I4891 Unspecified atrial fibrillation: Secondary | ICD-10-CM

## 2013-03-23 NOTE — Progress Notes (Signed)
No charge for INR - done in home with [facility] testing.  No charge for visit.

## 2013-03-23 NOTE — Telephone Encounter (Signed)
Patient called - see anticoagulation note for dosing changes

## 2013-03-27 ENCOUNTER — Telehealth: Payer: Self-pay | Admitting: Family Medicine

## 2013-03-27 NOTE — Telephone Encounter (Signed)
Patient called - warfarin dose is 1/2 tablet daily except none on Wednesdays.  Patient knowledge of dose confirmed with repeating directions back .

## 2013-03-29 ENCOUNTER — Telehealth: Payer: Self-pay | Admitting: Pharmacist

## 2013-03-29 ENCOUNTER — Telehealth: Payer: Self-pay | Admitting: *Deleted

## 2013-03-29 ENCOUNTER — Ambulatory Visit: Payer: Medicare Other | Admitting: Pharmacist

## 2013-03-29 DIAGNOSIS — I4891 Unspecified atrial fibrillation: Secondary | ICD-10-CM

## 2013-03-29 NOTE — Progress Notes (Signed)
No charge for INR - was checked by patient at home with [facility] system.

## 2013-03-29 NOTE — Telephone Encounter (Signed)
Patient called with blood sugar of 67.  Reports being weak and unable to think clearly. She is home by herself.  I asked if she needed to call 911 and she said no.  I asked if she could call a family member and she stated that they would fuss at her. I asked if she had peanut butter and orange juice.  She has peanut butter and pineapple chunks in juice. I told her to eat several tablespoons of peanut butter and some of the pineapple.  She stated that she could get to the food in her wheelchair. She will call back with an update in 30 min.

## 2013-03-29 NOTE — Telephone Encounter (Signed)
Patient called about INR results

## 2013-04-03 ENCOUNTER — Telehealth: Payer: Self-pay | Admitting: Family Medicine

## 2013-04-03 NOTE — Telephone Encounter (Signed)
Cut finger yesterday and EMS came out. Did not require stitches. EMS just advised that she be put on some type of antibiotic. Can that be called in or does she NTBS? Please advise

## 2013-04-03 NOTE — Telephone Encounter (Signed)
She is allergic to penicillin. I do not know if this was a severe reaction or just a rash. If it was not a severe reaction she can take Keflex 500 mg #20 one twice daily for 10 days. Ask her first if she is allergic to Keflex if she is discuss with me and we will use a different antibiotic

## 2013-04-05 NOTE — Telephone Encounter (Signed)
Patient spoke with our clinical pharmacist Tammy later in the afternoon on 03/29/13 and they discussed treatments for hypoglycemia.  This is probably noted in another encounter.

## 2013-04-06 ENCOUNTER — Ambulatory Visit (INDEPENDENT_AMBULATORY_CARE_PROVIDER_SITE_OTHER): Payer: Medicare Other | Admitting: Pharmacist

## 2013-04-06 DIAGNOSIS — I4891 Unspecified atrial fibrillation: Secondary | ICD-10-CM

## 2013-04-06 NOTE — Progress Notes (Signed)
No charge for INR or visit - patient checks INR at home through [facility] program

## 2013-04-07 ENCOUNTER — Telehealth: Payer: Self-pay | Admitting: *Deleted

## 2013-04-07 NOTE — Telephone Encounter (Signed)
Therapeutic anticoagulation reading from 04/06/13.  Continue current warfarin dose.

## 2013-04-07 NOTE — Telephone Encounter (Signed)
Spoke with pt to inform Keflex was called into [facility]

## 2013-04-10 ENCOUNTER — Telehealth: Payer: Self-pay | Admitting: Family Medicine

## 2013-04-13 ENCOUNTER — Encounter: Payer: Self-pay | Admitting: Family Medicine

## 2013-04-13 ENCOUNTER — Ambulatory Visit (INDEPENDENT_AMBULATORY_CARE_PROVIDER_SITE_OTHER): Payer: Medicare Other | Admitting: Family Medicine

## 2013-04-13 VITALS — BP 131/74 | HR 68 | Temp 97.8°F | Ht 64.0 in | Wt 314.0 lb

## 2013-04-13 DIAGNOSIS — F29 Unspecified psychosis not due to a substance or known physiological condition: Secondary | ICD-10-CM

## 2013-04-13 DIAGNOSIS — N390 Urinary tract infection, site not specified: Secondary | ICD-10-CM

## 2013-04-13 DIAGNOSIS — F322 Major depressive disorder, single episode, severe without psychotic features: Secondary | ICD-10-CM

## 2013-04-13 DIAGNOSIS — E1165 Type 2 diabetes mellitus with hyperglycemia: Secondary | ICD-10-CM

## 2013-04-13 DIAGNOSIS — IMO0002 Reserved for concepts with insufficient information to code with codable children: Secondary | ICD-10-CM

## 2013-04-13 DIAGNOSIS — F329 Major depressive disorder, single episode, unspecified: Secondary | ICD-10-CM

## 2013-04-13 DIAGNOSIS — R410 Disorientation, unspecified: Secondary | ICD-10-CM

## 2013-04-13 LAB — POCT CBC
Granulocyte percent: 61 %G (ref 37–80)
HCT, POC: 42.5 % (ref 37.7–47.9)
Hemoglobin: 13.8 g/dL (ref 12.2–16.2)
MCHC: 32.4 g/dL (ref 31.8–35.4)
MPV: 6.7 fL (ref 0–99.8)
POC Granulocyte: 5.7 (ref 2–6.9)
POC LYMPH PERCENT: 35.9 %L (ref 10–50)
RDW, POC: 13.2 %
WBC: 9.4 10*3/uL (ref 4.6–10.2)

## 2013-04-13 LAB — GLUCOSE, POCT (MANUAL RESULT ENTRY): POC Glucose: 144 mg/dl — AB (ref 70–99)

## 2013-04-13 LAB — POCT URINALYSIS DIPSTICK
Glucose, UA: NEGATIVE
Ketones, UA: NEGATIVE
Leukocytes, UA: NEGATIVE
Spec Grav, UA: <=1.005

## 2013-04-13 LAB — POCT UA - MICROSCOPIC ONLY
Mucus, UA: NEGATIVE
RBC, urine, microscopic: NEGATIVE
WBC, Ur, HPF, POC: NEGATIVE
Yeast, UA: NEGATIVE

## 2013-04-13 NOTE — Patient Instructions (Addendum)
Continue current medications. Continue good therapeutic lifestyle changes.  Fall precautions discussed with patient. schedule your flu vaccine the first of October. Follow up as planned and earlier as needed.  Seriously consider assisted living placement Have sons talk with them and find out what the financial responsibility would be on your part We will arrange to get an MRI of your head because of the confusion Take medication as directed and insulin as directed from your visit to the hospital 2 days ago

## 2013-04-13 NOTE — Progress Notes (Signed)
Subjective:    Patient ID: [name], female    DOB: 08/08/1940, 72 y.o.   MRN: [id number]  HPI Patient here today for altered mental status and possible uti. Patient had a recent visit to the emergency room at [facility] . The nose from this visit on September 30 were reviewed and she had an EKG and a BMP both of those revealed an junctional rhythm and a BMP that was within normal limits. She presents today with her son. She is in a wheelchair. She has morbid obesity. She is very tearful crying daily she is very confused and does not know how to take her insulin any more in a proper way. She denies any recent head injury. I had a long to followup with the patient prior to examining her about her need for more care and a daily lifestyle and has more order and regular eating habits. She has frequency and we will check a urinalysis today and because of increasing confusion we will try to arrange to get an MRI of her head. Because of her size this may have to be done at a location that is not a [facility] because she made need an open table.    Patient Active Problem List   Diagnosis Date Noted  . Type 2 diabetes mellitus 01/26/2013  . Hypothyroid 01/26/2013  . Hypertension 01/26/2013  . Hyperlipemia 01/26/2013  . Hypercapnia 01/08/2013  . CAP (community acquired pneumonia) 01/08/2013  . Chronic respiratory failure 01/08/2013  . A-fib 09/28/2012  . Congestive heart failure 07/22/2012  . Hypokalemia 07/22/2012  . GAD (generalized anxiety disorder) 07/22/2012  . Depression 07/22/2012  . Atrial fibrillation 07/22/2012  . Chronic low back pain 07/22/2012  . Diabetic neuropathy 07/22/2012  . Migraine 07/22/2012  . Dyspnea 11/26/2010  . Morbid obesity 06/04/2010  . SUPRAVENTRICULAR TACHYCARDIA 06/04/2010  . HYPOTHYROIDISM 04/22/2008  . DIABETES, TYPE 2 04/22/2008  . ALLERGIC RHINITIS 04/22/2008  . SLEEP APNEA 04/22/2008  . COUGH 04/22/2008  . RESTLESS LEGS SYNDROME 04/12/2008   . HYPERSOMNIA 04/12/2008   Outpatient Encounter Prescriptions as of 04/13/2013  Medication Sig Dispense Refill  . clonazePAM (KLONOPIN) 1 MG tablet Take 0.5-1 tablets (0.5-1 mg total) by mouth 3 (three) times daily.  30 tablet  1  . docusate sodium (COLACE) 100 MG capsule Take 100 mg by mouth daily.       . DULoxetine (CYMBALTA) 30 MG capsule Take 1 capsule (30 mg total) by mouth daily.  30 capsule  3  . fluticasone (FLONASE) 50 MCG/ACT nasal spray Place 2 sprays into the nose daily.  3 g  0  . furosemide (LASIX) 40 MG tablet Take 40 mg by mouth daily.      Marland Kitchen glucose blood (ONE TOUCH ULTRA TEST) test strip Test 4X a day and prn  Dx 250.02  100 each  12  . insulin aspart (NOVOLOG) 100 UNIT/ML injection Inject 4-16 Units into the skin 3 (three) times daily before meals. Use per sliding scale:  87-120= 4 units 121-150= 6 units 151-200= 8 units 201-250= 10 units 251-300= 12 units 301-350= 14 units 351-400= 16 units Over 400--to call MD.      . insulin glargine (LANTUS) 100 UNIT/ML injection Inject 35-40 Units into the skin 2 (two) times daily. 40 units in the morning and 35 units at bedtime      . levothyroxine (SYNTHROID, LEVOTHROID) 75 MCG tablet Take 1 tablet (75 mcg total) by mouth daily.  90 tablet  0  .  NITROSTAT 0.4 MG SL tablet USE AS DIRECTED  25 tablet  0  . nystatin (MYCOSTATIN) powder Apply topically as directed. As needed under breasts for irritation      . oxybutynin (DITROPAN) 5 MG tablet Take 1 tablet (5 mg total) by mouth 2 (two) times daily.  180 tablet  0  . oxyCODONE-acetaminophen (PERCOCET) 10-325 MG per tablet Take 1 tablet by mouth 4 (four) times daily as needed for pain.       . polyethylene glycol powder (GLYCOLAX/MIRALAX) powder Take 17 g by mouth daily.      . potassium chloride (MICRO-K) 10 MEQ CR capsule Take 1 capsule (10 mEq total) by mouth daily.  90 capsule  0  . pravastatin (PRAVACHOL) 80 MG tablet Take 1 tablet (80 mg total) by mouth daily.  90 tablet  0  .  sotalol (BETAPACE) 80 MG tablet TAKE 1 AND 1/2 TABLET TWICE A DAY  90 tablet  1  . Vitamin D, Ergocalciferol, (DRISDOL) 50000 UNITS CAPS Take 1 capsule (50,000 Units total) by mouth every 7 (seven) days.  12 capsule  0  . warfarin (COUMADIN) 5 MG tablet Take 0.5 tablets (2.5 mg total) by mouth daily.  30 tablet  3  . [DISCONTINUED] sotalol (BETAPACE) 80 MG tablet Take 1.5 tablets (120 mg total) by mouth 2 (two) times daily. Takes one & one-half tablet twice daily  270 tablet  0   No facility-administered encounter medications on file as of 04/13/2013.    Review of Systems  Constitutional: Negative.   HENT: Negative.   Eyes: Negative.   Respiratory: Negative.   Cardiovascular: Negative.   Gastrointestinal: Negative.   Endocrine: Negative.   Genitourinary: Positive for frequency and difficulty urinating.  Musculoskeletal: Negative.   Skin: Negative.   Allergic/Immunologic: Negative.   Neurological: Negative.   Hematological: Negative.   Psychiatric/Behavioral: Positive for confusion.       Objective:   Physical Exam  Nursing note and vitals reviewed. Constitutional: She is oriented to person, place, and time. She appears distressed.  Patient is morbidly obese and presents today with her son and she is in a wheelchair during this visit  HENT:  Head: Normocephalic and atraumatic.  Eyes: Conjunctivae and EOM are normal. Right eye exhibits no discharge. Left eye exhibits no discharge. No scleral icterus.  Neck: Normal range of motion. Neck supple.  Cardiovascular: Normal rate, regular rhythm and normal heart sounds.  Exam reveals no gallop and no friction rub.   No murmur heard. At 72 per minute  Pulmonary/Chest: Effort normal and breath sounds normal. No respiratory distress. She has no wheezes. She has no rales. She exhibits no tenderness.  Abdominal: Soft. Bowel sounds are normal. She exhibits no mass. There is no tenderness. There is no rebound and no guarding.  Musculoskeletal:  She exhibits no edema and no tenderness.  Patient is in a wheelchair  Neurological: She is alert and oriented to person, place, and time. She has normal reflexes. No cranial nerve deficit.  Skin: Skin is warm and dry. No rash noted. No erythema. No pallor.  Psychiatric: Thought content normal.  Patient is tearful crying depressed . I am uncertain about her ability to live by herself at home and take care of herself and this is why we have this long discussion about her need to be worsen when can help her manage her life better 24 hours a day   BP 131/74  Pulse 68  Temp(Src) 97.8 F (36.6 C) (Oral)  Ht  5\' 4"  (1.626 m)  Wt 314 lb (142.429 kg)  BMI 53.87 kg/m2  Results for orders placed in visit on 04/13/13  POCT URINALYSIS DIPSTICK      Result Value Range   Color, UA gold     Clarity, UA clear     Glucose, UA negative     Bilirubin, UA negative     Ketones, UA negative     Spec Grav, UA <=1.005     Blood, UA negative     pH, UA 6.0     Protein, UA negative     Urobilinogen, UA negative     Nitrite, UA negative     Leukocytes, UA Negative    POCT UA - MICROSCOPIC ONLY      Result Value Range   WBC, Ur, HPF, POC negative     RBC, urine, microscopic negative     Bacteria, U Microscopic few     Mucus, UA negative     Epithelial cells, urine per micros few     Crystals, Ur, HPF, POC negative     Casts, Ur, LPF, POC negative     Yeast, UA negative    POCT CBC      Result Value Range   WBC 9.4  4.6 - 10.2 K/uL   Lymph, poc 3.4  0.6 - 3.4   POC LYMPH PERCENT 35.9  10 - 50 %L   POC Granulocyte 5.7  2 - 6.9   Granulocyte percent 61.0  37 - 80 %G   RBC 4.5  4.04 - 5.48 M/uL   Hemoglobin 13.8  12.2 - 16.2 g/dL   HCT, POC 16.1  09.6 - 47.9 %   MCV 93.7  80 - 97 fL   MCH, POC 30.4  27 - 31.2 pg   MCHC 32.4  31.8 - 35.4 g/dL   RDW, POC 04.5     Platelet Count, POC 234.0  142 - 424 K/uL   MPV 6.7  0 - 99.8 fL  GLUCOSE, POCT (MANUAL RESULT ENTRY)      Result Value Range   POC  Glucose 144 (*) 70 - 99 mg/dl        Assessment & Plan:   1. UTI (urinary tract infection)   2. Morbid obesity   3. Severe depression   4. Insulin dependent type 2 diabetes mellitus, uncontrolled   5. Confusion    Orders Placed This Encounter  Procedures  . Urine culture  . POCT urinalysis dipstick  . POCT UA - Microscopic Only  . POCT CBC  . POCT glucose (manual entry)   Greater than 45 minutes was spent in face-to-face encounter with she and her son. Also an MRI of her head will be scheduled because of the increased confusion   Patient Instructions  Continue current medications. Continue good therapeutic lifestyle changes.  Fall precautions discussed with patient. schedule your flu vaccine the first of October. Follow up as planned and earlier as needed.  Seriously consider assisted living placement Have sons talk with them and find out what the financial responsibility would be on your part We will arrange to get an MRI of your head because of the confusion Take medication as directed and insulin as directed from your visit to the hospital 2 days ago    Nyra Capes MD

## 2013-04-14 ENCOUNTER — Telehealth: Payer: Self-pay | Admitting: *Deleted

## 2013-04-14 NOTE — Telephone Encounter (Signed)
Message copied by Bearl Mulberry on Fri Apr 14, 2013  3:25 PM ------      Message from: Ernestina Penna      Created: Thu Apr 13, 2013  5:34 PM       Patient's blood sugar was 144 at office visit today----this is not bad for her      Her CBC was within normal limits. The white blood cell care was normal, the hemoglobin was good and the platelet count was adequate            Please call patient and her son Kathlene November with this result ------

## 2013-04-14 NOTE — Telephone Encounter (Signed)
Pt notified of results Verbalizes understanding 

## 2013-04-15 LAB — URINE CULTURE

## 2013-04-15 LAB — POCT INR: INR: 1.3

## 2013-04-17 ENCOUNTER — Telehealth: Payer: Self-pay | Admitting: Pharmacist

## 2013-04-17 ENCOUNTER — Ambulatory Visit (INDEPENDENT_AMBULATORY_CARE_PROVIDER_SITE_OTHER): Payer: Medicare Other | Admitting: Pharmacist

## 2013-04-17 ENCOUNTER — Telehealth: Payer: Self-pay | Admitting: Family Medicine

## 2013-04-17 DIAGNOSIS — I4891 Unspecified atrial fibrillation: Secondary | ICD-10-CM

## 2013-04-17 NOTE — Telephone Encounter (Signed)
Patient's is going to eat an egg sandwich.  Her BG is 158 (which if down from 213 about 1-2 hours ago).  According to her sliding scale she show given 8 units of insulin but she is afraid.   I reassured patient and instructed her to administer 6 units of Novolog instead of 8 units and to recheck in 1-2 hours.

## 2013-04-17 NOTE — Telephone Encounter (Signed)
Called patient.  Her BG was 213.  She had recently ate a bologna sandwich.  Prior to eating sandwich she said her BG was low but could not remember what the BG reading was.  She did not give insulin prior to sandwich.   Reviewed checking BG prior to each meal and to use Novolog sliding scale are directed.  Patient has follow up appt on Thursday, October 9th.  She is reminded extremely important to bring in glucometer to that appt.

## 2013-04-17 NOTE — Progress Notes (Signed)
Patient checks INR at home with Home INR monitoring.  Billing once per month interupertation fee.  Patient diagnosis - atrial fibrillation  Procedure code is [id number]

## 2013-04-19 ENCOUNTER — Other Ambulatory Visit: Payer: Self-pay | Admitting: Family Medicine

## 2013-04-19 LAB — POCT INR: INR: 1.7

## 2013-04-20 ENCOUNTER — Encounter: Payer: Self-pay | Admitting: Pharmacist

## 2013-04-20 ENCOUNTER — Telehealth: Payer: Self-pay | Admitting: Pharmacist

## 2013-04-20 ENCOUNTER — Ambulatory Visit: Payer: Medicare Other | Admitting: Pharmacist

## 2013-04-20 ENCOUNTER — Ambulatory Visit (INDEPENDENT_AMBULATORY_CARE_PROVIDER_SITE_OTHER): Payer: Medicare Other | Admitting: Pharmacist

## 2013-04-20 VITALS — BP 140/78 | HR 74

## 2013-04-20 DIAGNOSIS — F329 Major depressive disorder, single episode, unspecified: Secondary | ICD-10-CM

## 2013-04-20 DIAGNOSIS — I4891 Unspecified atrial fibrillation: Secondary | ICD-10-CM

## 2013-04-20 DIAGNOSIS — E119 Type 2 diabetes mellitus without complications: Secondary | ICD-10-CM

## 2013-04-20 NOTE — Progress Notes (Signed)
Diabetes Follow-Up Visit Chief Complaint:   Chief Complaint  Patient presents with  . Diabetes     Filed Vitals:   04/20/13 1705  BP: 140/78  Pulse: 74    HPI: patient with long standing type 2 diabetes.  Her last Ac was 7.5% on 01/26/13.  Recently she has called the office several times with questions about whether or not to eat or give insulin. Patient is very confused about what BG goals are, when to give insulin and when not to.  She was very afraid of BG getting too low and too high.    BG readings 04/20/13  161, 115, 190, 174, 95, 138 04/19/13 208, 275, 125, 189, 146, 186, 222 04/18/13  130, 175, 160, 190 04/17/13  161, 255, 213, 188, 158, 177, 218  7 day average = 180 Patient states she sometimes checks her BG up to 12 times daily and will check each time she wakes during the night which might be up to 5 times.  Current Diabetes Medications:  Lantus / Levemir 40 units each morning and 35 units each evening. Novolog sliding scale (although patient sometimes gives 2 units less than recommended if she only snacking or concerned about BG decreasing too fast)  87-120 = 4 units  121 - 150 = 6 units  151-200 = 8 units  201 - 250 = 10 units  251 - 300 = 12 units  301 - 350 = 14 units  351 - 400 = 16 units   Other important information is that patient had stopped Cymbalta recently due to cost.  She states that she was previously having less anxiety prior to stopping Cymbalta but she was afraid that if she restarted Cymbalta that it would make her feel jittery.   Exam Edema:  negative  Polyuria:  positive  Polydipsia:  negative Polyphagia:  negative  General Appearance:  obese Mood/Affect:  anxious   Low fat/carbohydrate diet?  No Nicotine Abuse?  No Medication Compliance?  No Exercise?  No Alcohol Abuse?  No    Lab Results  Component Value Date   HGBA1C 7.5% 01/26/2013    No results found for this basename: MICROALBUR, [id number]    Lab Results  Component Value Date    CHOL  Value: 227        ATP III CLASSIFICATION:  <200     mg/dL   Desirable  161-096  mg/dL   Borderline High  >=045    mg/dL   High* 40/98/1191   HDL 24* 06/03/2008   LDLCALC 124* 01/26/2013   TRIG 146 01/26/2013   CHOLHDL 9.5 06/03/2008      Assessment: 1.  Diabetes.  uncontrolled 2.  Blood Pressure.  controlled 3.  Lipids.  Elevated LDL- due recheck in next 1-2 weeks 4.  Depression and anxiety  Recommendations: 1.  Medication recommendations at this time are as follows:   Change Lantus / Levemir to 42 units each morning and 30 units each evening  Change Novolog sliding scale      If eating meal - less than 80 no insulin,      80-120 - 4 units     121-150 - 5 units     151 - 200 - 6 units     201 - 250 - 8 units     251 - 300 - 10 units     Over 300 - 12 units  If not eating a meal - less than 200 - no insulin  201 - 250 - 4 units     251 - 300 - 6 units     Over 300 - 8 units  2.  Reviewed HBG goals:  Fasting 80-120 and 1-2 hour post prandial <180.  Patient is instructed to check BG prior to meal and if she  Feels BG is too high or too low.  Would like to have patient test LESS frequently.   3.  Reviewed the relationship of insulin and food and the timing of increases and decreases in BG to administration of Novolon versus Lantus / Levemir.  Reviewed proper treatment of hypoglycemia.    4.  BP goal < 140/80. 5.  LDL goal of < 100, HDL > 40 and TG < 150. 6.  Restart Cymbalta 60mg  daily 7.  Return to clinic in 4-6 wks   Time spent counseling patient:  75 minutes  Referring provider:  Lady Deutscher, PharmD, CPP

## 2013-04-20 NOTE — Progress Notes (Signed)
She was supposed to get an MRI of her head because of her recent onset of confusion . Unfortunately because of her obesity it will require an open MRI and this was supposed to have been set up for her. I will send this note back to referrals and list try to follow through on it and see if we cannot get this done

## 2013-04-20 NOTE — Progress Notes (Signed)
Patient called about INR results and instructions - extra 1/2 tablet today , then restart 1/2 tablet daily except none on Wednesdays.  Continue to check INR at home weekly.

## 2013-04-20 NOTE — Telephone Encounter (Signed)
Discussed with patient.  She has appt this afternoon to evaluate BG already.  Will discuss at appt.

## 2013-04-25 ENCOUNTER — Telehealth: Payer: Self-pay | Admitting: *Deleted

## 2013-04-25 NOTE — Telephone Encounter (Signed)
Please filed a request from the home health nurse and order a new CPAP machine with supplys

## 2013-04-25 NOTE — Telephone Encounter (Signed)
[facility] nurse called and stated that she is caring for patient and that she is in need of a new cpap machine. Wants to know if you will send in rx to [facility] stating: Cpap machine and supplies. Keep pressure at previous settings. Please advise

## 2013-04-27 ENCOUNTER — Other Ambulatory Visit: Payer: Self-pay | Admitting: Family Medicine

## 2013-04-28 ENCOUNTER — Ambulatory Visit (INDEPENDENT_AMBULATORY_CARE_PROVIDER_SITE_OTHER): Payer: Medicare Other | Admitting: Nurse Practitioner

## 2013-04-28 DIAGNOSIS — I4891 Unspecified atrial fibrillation: Secondary | ICD-10-CM

## 2013-04-28 LAB — PROTIME-INR: INR: 1.6 — AB (ref <=1.1)

## 2013-04-28 NOTE — Patient Instructions (Signed)
Anticoagulation Dose Instructions as of 04/28/2013     Glynis Smiles Tue Wed Thu Fri Sat   New Dose 2.5 mg 2.5 mg 5 mg 2.5 mg 2.5 mg 5 mg 2.5 mg    Description       Take whole tablet on fridays- add back half tablet on wednesdays- recheck in 1 week.

## 2013-05-01 ENCOUNTER — Telehealth: Payer: Self-pay | Admitting: Family Medicine

## 2013-05-01 ENCOUNTER — Telehealth: Payer: Self-pay | Admitting: Pharmacist

## 2013-05-01 NOTE — Telephone Encounter (Signed)
BG was 140 before lunch, which is good.  Patient is beginning to see that BG is not dropping too low between meals and that frequent snacking is not necessary.  Reminded patient about s/s of hypoglycemia, that less than 70 is too low and gave example of things she should eat or drink when having hypoglycemia.

## 2013-05-01 NOTE — Telephone Encounter (Signed)
Called patient to check on BG.  Her BG is 148 at 10:30am.  Reassured patient that this is a good reading.  She is planning to have lunch in 1-2 hours and I instructed that she should recheck BG prior to lunch and administer Novolog according to the sliding scale provided at our last visit.

## 2013-05-01 NOTE — Telephone Encounter (Signed)
Patient called.  She ate oatmeal for breakfast about 5:45am and BG was 176 at 8:30am.  She is concerned that if she doesn't eat something now at 8:45am that her BG will continue to bee too low.   She also says that she is still checking her BG constantly and eating about every 2 hours.   Advised patient not to eat anything or give any insulin.  She is to recheck BG at 10am and call me with results.   She is advised to eat 3 meals a day and up to 3 snacks if she is hungry.  She is also advised to check BG prior to each meal and at bedtime and if she is having s/s of hypoglycemia.

## 2013-05-03 ENCOUNTER — Telehealth: Payer: Self-pay | Admitting: *Deleted

## 2013-05-03 NOTE — Telephone Encounter (Signed)
Patient states that she picked up her prescription for hydrocodone on 04/13/13. Half of her meds disappeared about 2 weeks ago. She has taken a lower dose since then to stretch them out. She reports that she is having symptoms of "detox" and they have been getting progressively worse over the past 2 weeks. She complains of episodes of difficulty breathing, anxiety, and trouble sleeping. She wants to know if there is anything that she can take to alleviate the symptoms.  I explained that she needed to come in for an evaluation of her symptoms and that we shouldn't just assume that they are due to her decrease in meds. She doesn't think that she can get her son to bring her over today and she asked that I discuss this with Dr. Christell Constant.  Do you have any suggestions?

## 2013-05-03 NOTE — Telephone Encounter (Signed)
See the note below

## 2013-05-03 NOTE — Telephone Encounter (Signed)
He is probably a good idea to not take so much pain medication. Drink plenty of fluids Take Tylenol to supplement the diminished dose of pain medicine Please contact clinic if symptoms get worse

## 2013-05-04 ENCOUNTER — Telehealth: Payer: Self-pay | Admitting: Family Medicine

## 2013-05-04 NOTE — Telephone Encounter (Signed)
I do not think that they are able to do this and states [place] think [facility] has a program for providing medications but to my knowledge [facility] [place] does not

## 2013-05-04 NOTE — Telephone Encounter (Signed)
Discussed with patient.. She will call if symptoms worsen.

## 2013-05-04 NOTE — Telephone Encounter (Signed)
Called patient.   Recent BG readings are as follows:  180 (after breakfast), 132, 145, 152, 127. She states that she was hungry in the middle of the night last night and she ate a snack and checked BG every 2 hours. I think patient is checking BG too much and this is upsetting her.  I explained that her BG while not perfectly controlled she is actually getting pretty good readings.  I told her not to check more than 4 times per day and not to check BG in the middle of the night.  If she wakes up and is hungry - it is OK to eat a 15 to 20 gram snack (discussed examples of this) and that she didn't need to check BG.   Also reminded her to check Home INR today and call results in to [facility]

## 2013-05-08 ENCOUNTER — Telehealth: Payer: Self-pay | Admitting: Pharmacist

## 2013-05-08 NOTE — Telephone Encounter (Signed)
Called patient back.  BG 30 minutes post prandial was 262.  I recommended she give 6 more unit of novolog insulin.  Will follow up in 1-2 hours.

## 2013-05-08 NOTE — Telephone Encounter (Signed)
Still waking up at night and checking BG 3-5 times.  Last night she checked at 1240am - 185; 220am - 188; 320am - 177; 440am - 179 and 640am - 182.   She reports that she ate twice and gave insulin during this time as well.   Prior to breakfast her BG was 182 - she ate egg, bologna and cheese sandwich and at noon BG was 252.  Patient is afraid to take 10 units of Novolog as sliding scale suggests.   I again recommended to patient that she stop checking BG during the night and to not eat or give insulin during the night / bedtime.  I instructed her to follow sliding scale and inject 10 units of novolog and to have lunch.  She is to check BG in 1 hour and call with results.

## 2013-05-08 NOTE — Telephone Encounter (Signed)
Called patient at 4:30pm.  BG was 203.   Recommend she check BG prior supper and give recommended Novolog amount per sliding scale.

## 2013-05-09 ENCOUNTER — Telehealth: Payer: Self-pay | Admitting: Pharmacist Clinician (PhC)/ Clinical Pharmacy Specialist

## 2013-05-10 ENCOUNTER — Ambulatory Visit (INDEPENDENT_AMBULATORY_CARE_PROVIDER_SITE_OTHER): Payer: Medicare Other | Admitting: Family Medicine

## 2013-05-10 ENCOUNTER — Telehealth: Payer: Self-pay | Admitting: Pharmacist

## 2013-05-10 ENCOUNTER — Telehealth: Payer: Self-pay | Admitting: Family Medicine

## 2013-05-10 ENCOUNTER — Encounter: Payer: Self-pay | Admitting: Family Medicine

## 2013-05-10 VITALS — BP 182/77 | HR 69 | Temp 97.2°F | Wt 314.0 lb

## 2013-05-10 DIAGNOSIS — F329 Major depressive disorder, single episode, unspecified: Secondary | ICD-10-CM

## 2013-05-10 NOTE — Telephone Encounter (Signed)
Patient coming in at 2pm to see [employer]

## 2013-05-10 NOTE — Patient Instructions (Signed)

## 2013-05-10 NOTE — Telephone Encounter (Signed)
Spoke with patient who is crying and upset. States that she is SOB and she is keeping her O2 on at all times and it still feels like she can't breathe. Advised that she should go to the ER. She states that she has been to [facility] several times and they can't find anything wrong with her. Offered patient appt for today and she became even more upset when she found out is what not with the provider that she preferred. Advised that we only had a few available appts. She agreed to appt with someone else.  Appt given

## 2013-05-10 NOTE — Progress Notes (Signed)
  Subjective:    Patient ID: [name], female    DOB: 04/10/1941, 72 y.o.   MRN: [id number]  HPI This 72 y.o. female presents for evaluation of shortness of breath and anxiety. She has been to the ED for this and she has been told she needs psychiatric Care.  She does admit to anxiety problems.  She has hx of depression and anxiety And has been seeing dr. Christell Constant for this. Dr. Christell Constant talks with her and her son Who accompany her and she is in agreement with him that she should go to A psychiatric facility for her anxiety.     Review of Systems C/o anxiety No chest pain, SOB, HA, dizziness, vision change, N/V, diarrhea, constipation, dysuria, urinary urgency or frequency, myalgias, arthralgias or rash.     Objective:   Physical Exam Vital signs noted  Well developed well nourished obese and anxious female in NAD.  HEENT - Head atraumatic Normocephalic                Eyes - PERRLA, Conjuctiva - clear Sclera- Clear EOMI                Ears - EAC's Wnl TM's Wnl Gross Hearing WNL                Throat - oropharanx wnl Respiratory - Lungs CTA bilateral Cardiac - RRR S1 and S2 without murmur GI - Abdomen Obese soft Nontender and bowel sounds active x 4.       Assessment & Plan:  Depression Plan is to have her admitted to [facility]  And will go to ED at [facility].  Deatra Canter FNP

## 2013-05-24 ENCOUNTER — Ambulatory Visit (INDEPENDENT_AMBULATORY_CARE_PROVIDER_SITE_OTHER): Payer: Medicare Other | Admitting: Pharmacist

## 2013-05-24 DIAGNOSIS — I4891 Unspecified atrial fibrillation: Secondary | ICD-10-CM

## 2013-05-24 NOTE — Progress Notes (Signed)
Patient checks INR at home with Home INR monitoring.  Billing once per month interupertation fee.  Patient diagnosis - a.fib 427.31 Procedure code if [id number]

## 2013-06-01 ENCOUNTER — Ambulatory Visit: Payer: Self-pay

## 2013-06-04 ENCOUNTER — Other Ambulatory Visit: Payer: Self-pay | Admitting: Family Medicine

## 2013-06-05 ENCOUNTER — Ambulatory Visit: Payer: Medicare Other | Admitting: Pharmacist

## 2013-06-05 ENCOUNTER — Telehealth: Payer: Self-pay

## 2013-06-05 DIAGNOSIS — I4891 Unspecified atrial fibrillation: Secondary | ICD-10-CM

## 2013-06-05 LAB — POCT INR: INR: 1.2

## 2013-06-05 NOTE — Telephone Encounter (Signed)
See anticoagulation note 

## 2013-06-05 NOTE — Telephone Encounter (Signed)
INR from 06/02/13  1.2   We don't know if this was taken care of Friday  Gina and Debbi Off  Angelica Chessman was in RX but she isn't here to ask

## 2013-06-09 LAB — POCT INR: INR: 1.5

## 2013-06-12 ENCOUNTER — Ambulatory Visit (INDEPENDENT_AMBULATORY_CARE_PROVIDER_SITE_OTHER): Payer: Medicare Other | Admitting: Pharmacist

## 2013-06-12 DIAGNOSIS — I4891 Unspecified atrial fibrillation: Secondary | ICD-10-CM

## 2013-06-12 NOTE — Progress Notes (Signed)
Patient checks INR at home with Home INR monitoring.  Billing once per month interupertation fee.  Patient diagnosis - atrial fibrillation Procedure code if [id number]  

## 2013-06-15 ENCOUNTER — Ambulatory Visit (INDEPENDENT_AMBULATORY_CARE_PROVIDER_SITE_OTHER): Payer: Medicare Other | Admitting: Pharmacist

## 2013-06-15 ENCOUNTER — Telehealth: Payer: Self-pay | Admitting: Pharmacist

## 2013-06-15 DIAGNOSIS — I4891 Unspecified atrial fibrillation: Secondary | ICD-10-CM

## 2013-06-15 LAB — POCT INR: INR: 2.4

## 2013-06-15 NOTE — Telephone Encounter (Signed)
Patient called - instructed to continue 2.5mg  daily and recheck in 1 week.

## 2013-06-18 ENCOUNTER — Other Ambulatory Visit: Payer: Self-pay | Admitting: Family Medicine

## 2013-06-21 NOTE — Telephone Encounter (Signed)
Pt needs refill on Klonopin Please advise

## 2013-06-21 NOTE — Telephone Encounter (Signed)
Dr. Buel Ream pt, She saw u once. Route to pool A if Clonazepam is approved. [facility]

## 2013-06-22 NOTE — Telephone Encounter (Signed)
Rx for Klonopin called into [facility]

## 2013-07-03 ENCOUNTER — Telehealth: Payer: Self-pay | Admitting: Pharmacist

## 2013-07-10 NOTE — Telephone Encounter (Signed)
This message is dated from Oct 2014 -feel llike someone has contacted her or she has called back by now

## 2013-07-13 LAB — POCT INR: INR: 3.3

## 2013-07-14 ENCOUNTER — Ambulatory Visit (INDEPENDENT_AMBULATORY_CARE_PROVIDER_SITE_OTHER): Payer: Medicare Other | Admitting: Pharmacist

## 2013-07-14 DIAGNOSIS — I4891 Unspecified atrial fibrillation: Secondary | ICD-10-CM

## 2013-07-14 NOTE — Progress Notes (Signed)
Patient checks INR at home with Home INR monitoring.  Billing once per month interupertation fee.  Patient diagnosis - venous thromboembolism.  Procedure code if [id number]  

## 2013-07-14 NOTE — Telephone Encounter (Signed)
Called to remind to check INR

## 2013-07-20 ENCOUNTER — Ambulatory Visit (INDEPENDENT_AMBULATORY_CARE_PROVIDER_SITE_OTHER): Payer: Medicare Other | Admitting: Pharmacist

## 2013-07-20 DIAGNOSIS — I4891 Unspecified atrial fibrillation: Secondary | ICD-10-CM

## 2013-07-26 ENCOUNTER — Telehealth: Payer: Self-pay | Admitting: Pharmacist

## 2013-07-26 NOTE — Telephone Encounter (Signed)
Patient is due to check INR tomorrow by [facility] INR at home.  Patient called.

## 2013-08-01 ENCOUNTER — Telehealth: Payer: Self-pay | Admitting: Family Medicine

## 2013-08-02 ENCOUNTER — Telehealth: Payer: Self-pay | Admitting: Pharmacist

## 2013-08-02 ENCOUNTER — Ambulatory Visit (INDEPENDENT_AMBULATORY_CARE_PROVIDER_SITE_OTHER): Payer: Medicare Other | Admitting: Pharmacist

## 2013-08-02 DIAGNOSIS — I4891 Unspecified atrial fibrillation: Secondary | ICD-10-CM

## 2013-08-02 LAB — POCT INR: INR: 2.3

## 2013-08-02 NOTE — Telephone Encounter (Signed)
BG was 386 1 hour ago (she had eaten ice cream).  Recommended she recheck BG and it was 362. Recommend she give 8 units Novolog but patient is afraid this is too much - so I suggested give 5 units and recheck in 1 hour.

## 2013-08-02 NOTE — Progress Notes (Signed)
No charge for labs or visit - INR check through home monitoring system  

## 2013-08-21 ENCOUNTER — Telehealth: Payer: Self-pay | Admitting: Pharmacist

## 2013-08-22 LAB — POCT INR: INR: 2.2

## 2013-08-23 ENCOUNTER — Ambulatory Visit (INDEPENDENT_AMBULATORY_CARE_PROVIDER_SITE_OTHER): Payer: Medicare Other | Admitting: Pharmacist

## 2013-08-23 DIAGNOSIS — I4891 Unspecified atrial fibrillation: Secondary | ICD-10-CM

## 2013-08-23 NOTE — Progress Notes (Signed)
Patient checks INR at home with Home INR monitoring.  Billing once per month interupertation fee.  Patient diagnosis - venous thromboembolism.  Procedure code if [id number]  

## 2013-08-23 NOTE — Telephone Encounter (Signed)
Patient performed INR 08/23/13 - see anticoagulation note

## 2013-08-30 ENCOUNTER — Telehealth: Payer: Self-pay | Admitting: General Practice

## 2013-08-30 LAB — POCT INR: INR: 5.1

## 2013-08-30 NOTE — Telephone Encounter (Signed)
Spoke with patient and informed ([facility] IN[facility]) called INR result of 5.1. Patient confirmed that was the result she reported to [facility]. Patient reports taking warfarin as prescribed, denies taking antibiotics, or change in eating habits. Also denies any bleeding. Patient instructed not take this evening or Thursday evening dose of warfarin, recheck INR on Friday and report results as usual. Patient verbalized understanding and in agreement.

## 2013-08-31 ENCOUNTER — Ambulatory Visit: Payer: Medicare Other | Admitting: Family Medicine

## 2013-08-31 ENCOUNTER — Ambulatory Visit (INDEPENDENT_AMBULATORY_CARE_PROVIDER_SITE_OTHER): Payer: Medicare Other | Admitting: Pharmacist

## 2013-08-31 DIAGNOSIS — I4891 Unspecified atrial fibrillation: Secondary | ICD-10-CM

## 2013-08-31 NOTE — Progress Notes (Signed)
Patient's INR is supratherapeutic.  She denies any s/s of bleeding.  She was instructed by [name] yesterday evening to hold warfarin for 2 days. She already has appt today to see Dr Christell ConstantMoore at 3pm.  I recommend that we recheck INR and CBC in office today.   Henrene Pastorammy Gauge Winski, PharmD, CPP

## 2013-09-05 ENCOUNTER — Ambulatory Visit: Payer: Medicare Other | Admitting: Family Medicine

## 2013-09-06 ENCOUNTER — Ambulatory Visit (INDEPENDENT_AMBULATORY_CARE_PROVIDER_SITE_OTHER): Payer: Medicare Other | Admitting: Pharmacist

## 2013-09-06 DIAGNOSIS — I4891 Unspecified atrial fibrillation: Secondary | ICD-10-CM

## 2013-09-06 LAB — POCT INR: INR: 1.8

## 2013-09-06 NOTE — Progress Notes (Signed)
INR results faxed from home INR monitoring company [facility] No charge for lab or visit.

## 2013-09-08 ENCOUNTER — Other Ambulatory Visit: Payer: Self-pay | Admitting: Family Medicine

## 2013-09-11 NOTE — Telephone Encounter (Signed)
Last ov with provider other than pharmacy was 10/14.

## 2013-09-13 ENCOUNTER — Ambulatory Visit: Payer: Medicare Other | Admitting: Family Medicine

## 2013-09-13 ENCOUNTER — Other Ambulatory Visit: Payer: Self-pay | Admitting: Nurse Practitioner

## 2013-09-13 LAB — POCT INR: INR: 1.8

## 2013-09-14 ENCOUNTER — Ambulatory Visit (INDEPENDENT_AMBULATORY_CARE_PROVIDER_SITE_OTHER): Payer: Medicare Other | Admitting: Pharmacist

## 2013-09-14 ENCOUNTER — Telehealth: Payer: Self-pay | Admitting: Family Medicine

## 2013-09-14 DIAGNOSIS — I4891 Unspecified atrial fibrillation: Secondary | ICD-10-CM

## 2013-09-14 NOTE — Progress Notes (Signed)
Patient checks INR at home with Home INR monitoring.  Billing once per month interupertation fee.  Patient diagnosis - venous thromboembolism.  Procedure code if [id number]  

## 2013-09-14 NOTE — Telephone Encounter (Signed)
Last seen 05/10/13  JWO  If  Approved route to nurse to call into [facility]

## 2013-09-14 NOTE — Telephone Encounter (Signed)
Patient called - see anticoagulation note. 

## 2013-09-15 ENCOUNTER — Telehealth: Payer: Self-pay | Admitting: Family Medicine

## 2013-09-16 ENCOUNTER — Other Ambulatory Visit: Payer: Self-pay | Admitting: Nurse Practitioner

## 2013-09-16 ENCOUNTER — Other Ambulatory Visit: Payer: Self-pay | Admitting: Family Medicine

## 2013-09-18 ENCOUNTER — Other Ambulatory Visit: Payer: Self-pay | Admitting: Family Medicine

## 2013-09-18 MED ORDER — CLONAZEPAM 1 MG PO TABS: 1.0000 mg | ORAL_TABLET | Freq: Three times a day (TID) | ORAL | Status: DC | PRN

## 2013-09-18 NOTE — Telephone Encounter (Signed)
[facility]10/13  [facility]

## 2013-09-18 NOTE — Telephone Encounter (Signed)
[facility]10/13  [facility]  Last Vit  Level 7/14  Normal 38

## 2013-09-18 NOTE — Telephone Encounter (Signed)
Pt.notified

## 2013-09-19 NOTE — Telephone Encounter (Signed)
[facility] 05/10/13  [facility]

## 2013-09-21 ENCOUNTER — Ambulatory Visit (INDEPENDENT_AMBULATORY_CARE_PROVIDER_SITE_OTHER): Payer: Medicare Other | Admitting: Pharmacist

## 2013-09-21 ENCOUNTER — Telehealth: Payer: Self-pay | Admitting: Pharmacist

## 2013-09-21 DIAGNOSIS — I4891 Unspecified atrial fibrillation: Secondary | ICD-10-CM

## 2013-09-21 LAB — POCT INR: INR: 2.3

## 2013-09-21 NOTE — Telephone Encounter (Signed)
Will look into situation - patinet states that [employer] or [employer] is not in network and testin supplies for INR monitor no longer covered.

## 2013-09-27 NOTE — Telephone Encounter (Signed)
I spoke with [employer] who has been supplying patient [facility] testing supplies.  [facility] has [facility] and [employer] is not in network with [facility].  I will get information to contact [facility] to see who is in network .

## 2013-10-05 ENCOUNTER — Ambulatory Visit (INDEPENDENT_AMBULATORY_CARE_PROVIDER_SITE_OTHER): Payer: Medicare Other | Admitting: Pharmacist

## 2013-10-05 ENCOUNTER — Telehealth: Payer: Self-pay | Admitting: Pharmacist

## 2013-10-05 DIAGNOSIS — I4891 Unspecified atrial fibrillation: Secondary | ICD-10-CM

## 2013-10-05 LAB — POCT INR: INR: 1.6

## 2013-10-05 NOTE — Telephone Encounter (Signed)
Please see phone note from 09/22/13 - i have tried to call patient multiple times regarding INR.  I need insurance information to find out who is in network and can supply INR Ratio supplies as [employer] is no longer in her insurance plan's network.  Patient has not has been in office for 2015 and a copy of insurance card is not available. I actually spoke with her son today and he was suppose to give her a message to call me.  I spoke with patient and she asked me to call [facility] about supplies for INR monitor.  I called [phone] and spoke with someone who is going to check into request.   Other numbers given by patient was [phone] Timothy Lasso(Zack or Ree KidaJack) or [employer] [phone].

## 2013-10-08 ENCOUNTER — Other Ambulatory Visit: Payer: Self-pay | Admitting: Family Medicine

## 2013-10-09 ENCOUNTER — Telehealth: Payer: Self-pay | Admitting: Family Medicine

## 2013-10-09 NOTE — Telephone Encounter (Signed)
Asked patient her BG was today - 140's I recommended patient just skip am dose of levemir this am and restart this pm.

## 2013-10-10 ENCOUNTER — Ambulatory Visit (INDEPENDENT_AMBULATORY_CARE_PROVIDER_SITE_OTHER): Payer: Medicare Other | Admitting: Family Medicine

## 2013-10-10 ENCOUNTER — Encounter: Payer: Self-pay | Admitting: Family Medicine

## 2013-10-10 VITALS — BP 134/75 | HR 63 | Temp 96.7°F | Ht 64.0 in

## 2013-10-10 DIAGNOSIS — E119 Type 2 diabetes mellitus without complications: Secondary | ICD-10-CM

## 2013-10-10 DIAGNOSIS — E559 Vitamin D deficiency, unspecified: Secondary | ICD-10-CM

## 2013-10-10 DIAGNOSIS — E785 Hyperlipidemia, unspecified: Secondary | ICD-10-CM

## 2013-10-10 DIAGNOSIS — F3289 Other specified depressive episodes: Secondary | ICD-10-CM

## 2013-10-10 DIAGNOSIS — F411 Generalized anxiety disorder: Secondary | ICD-10-CM

## 2013-10-10 DIAGNOSIS — F32A Depression, unspecified: Secondary | ICD-10-CM

## 2013-10-10 DIAGNOSIS — F29 Unspecified psychosis not due to a substance or known physiological condition: Secondary | ICD-10-CM

## 2013-10-10 DIAGNOSIS — F329 Major depressive disorder, single episode, unspecified: Secondary | ICD-10-CM

## 2013-10-10 DIAGNOSIS — Z23 Encounter for immunization: Secondary | ICD-10-CM

## 2013-10-10 DIAGNOSIS — I4891 Unspecified atrial fibrillation: Secondary | ICD-10-CM

## 2013-10-10 DIAGNOSIS — R410 Disorientation, unspecified: Secondary | ICD-10-CM

## 2013-10-10 DIAGNOSIS — Z79899 Other long term (current) drug therapy: Secondary | ICD-10-CM

## 2013-10-10 DIAGNOSIS — R413 Other amnesia: Secondary | ICD-10-CM

## 2013-10-10 DIAGNOSIS — E039 Hypothyroidism, unspecified: Secondary | ICD-10-CM

## 2013-10-10 LAB — POCT CBC
GRANULOCYTE PERCENT: 60.8 % (ref 37–80)
HEMATOCRIT: 45.3 % (ref 37.7–47.9)
Hemoglobin: 14.4 g/dL (ref 12.2–16.2)
Lymph, poc: 2.7 (ref 0.6–3.4)
MCH, POC: 30 pg (ref 27–31.2)
MCHC: 31.8 g/dL (ref 31.8–35.4)
MCV: 94.4 fL (ref 80–97)
MPV: 7.3 fL (ref 0–99.8)
POC GRANULOCYTE: 4.9 (ref 2–6.9)
POC LYMPH %: 34 % (ref 10–50)
Platelet Count, POC: 209 10*3/uL (ref 142–424)
RBC: 4.8 M/uL (ref 4.04–5.48)
RDW, POC: 13 %
WBC: 8 10*3/uL (ref 4.6–10.2)

## 2013-10-10 LAB — POCT UA - MICROALBUMIN: MICROALBUMIN (UR) POC: NEGATIVE mg/L

## 2013-10-10 LAB — POCT GLYCOSYLATED HEMOGLOBIN (HGB A1C)

## 2013-10-10 LAB — POCT INR: INR: 1.5

## 2013-10-10 MED ORDER — CLONAZEPAM 1 MG PO TABS: 1.0000 mg | ORAL_TABLET | Freq: Three times a day (TID) | ORAL | Status: DC | PRN

## 2013-10-10 NOTE — Progress Notes (Signed)
Subjective:    Patient ID: [name], female    DOB: May 02, 1941, 73 y.o.   MRN: [id number]  HPI Pt here for follow up and management of chronic medical problems. The patient does complain of some generalized arthralgias and she has been more confused and worried about her memory. She will be given an MMSE today. The patient lives at home by herself and is assisted by her 2 sons. She uses a wheelchair mostly around the house and is able to transfer from her bed to the wheelchair and to a bedside commode and occasionally 2 a regular commode. She is tearful and depressed as usual.        Patient Active Problem List   Diagnosis Date Noted  . Type 2 diabetes mellitus 01/26/2013  . Hypothyroid 01/26/2013  . Hypertension 01/26/2013  . Hyperlipemia 01/26/2013  . Hypercapnia 01/08/2013  . CAP (community acquired pneumonia) 01/08/2013  . Chronic respiratory failure 01/08/2013  . A-fib 09/28/2012  . Congestive heart failure 07/22/2012  . Hypokalemia 07/22/2012  . GAD (generalized anxiety disorder) 07/22/2012  . Depression 07/22/2012  . Atrial fibrillation 07/22/2012  . Chronic low back pain 07/22/2012  . Diabetic neuropathy 07/22/2012  . Migraine 07/22/2012  . Dyspnea 11/26/2010  . Morbid obesity 06/04/2010  . SUPRAVENTRICULAR TACHYCARDIA 06/04/2010  . HYPOTHYROIDISM 04/22/2008  . DIABETES, TYPE 2 04/22/2008  . ALLERGIC RHINITIS 04/22/2008  . SLEEP APNEA 04/22/2008  . COUGH 04/22/2008  . RESTLESS LEGS SYNDROME 04/12/2008  . HYPERSOMNIA 04/12/2008   Outpatient Encounter Prescriptions as of 10/10/2013  Medication Sig  . clonazePAM (KLONOPIN) 1 MG tablet Take 1 tablet (1 mg total) by mouth 3 (three) times daily as needed for anxiety.  . fluticasone (FLONASE) 50 MCG/ACT nasal spray USE 2 SPRAYS INTO THE NOSE DAILY.  Marland Kitchen glucose blood (ONE TOUCH ULTRA TEST) test strip Test 4X a day and prn  Dx 250.02  . insulin aspart (NOVOLOG) 100 UNIT/ML injection Inject 4-16 Units into the  skin 3 (three) times daily before meals. Use per sliding scale  . insulin detemir (LEVEMIR) 100 UNIT/ML injection Inject into the skin at bedtime. 42 units AM and 30 units QHS  . levothyroxine (SYNTHROID, LEVOTHROID) 75 MCG tablet TAKE 1 TABLET (75 MCG TOTAL) BY MOUTH DAILY.  Marland Kitchen NITROSTAT 0.4 MG SL tablet USE AS DIRECTED  . oxyCODONE-acetaminophen (PERCOCET) 10-325 MG per tablet Take 1 tablet by mouth 4 (four) times daily as needed for pain.   . potassium chloride (MICRO-K) 10 MEQ CR capsule TAKE 1 CAPSULE (10 MEQ TOTAL) BY MOUTH DAILY.  . pravastatin (PRAVACHOL) 80 MG tablet Take 1 tablet (80 mg total) by mouth daily.  . sotalol (BETAPACE) 80 MG tablet TAKE 1 AND 1/2 TABLET TWICE A DAY  . venlafaxine (EFFEXOR) 37.5 MG tablet TAKE 1 TABLET BY MOUTH 3 TIMES A DAY WITH MEALS AND 2 AT BEDTIME  . Vitamin D, Ergocalciferol, (DRISDOL) 50000 UNITS CAPS capsule TAKE 1 CAPSULE (50,000 UNITS TOTAL) BY MOUTH EVERY 7 (SEVEN) DAYS.  Marland Kitchen warfarin (COUMADIN) 5 MG tablet Take 0.5 tablets (2.5 mg total) by mouth daily.  . furosemide (LASIX) 40 MG tablet Take 40 mg by mouth daily.  . polyethylene glycol powder (GLYCOLAX/MIRALAX) powder Take 17 g by mouth daily.  . [DISCONTINUED] docusate sodium (COLACE) 100 MG capsule Take 100 mg by mouth daily.   . [DISCONTINUED] DULoxetine (CYMBALTA) 30 MG capsule Take 30 mg by mouth every morning.  . [DISCONTINUED] fluticasone (FLONASE) 50 MCG/ACT nasal spray Place 2 sprays into  the nose daily.  . [DISCONTINUED] insulin glargine (LANTUS) 100 UNIT/ML injection Inject 0.35-0.4 mLs (35-40 Units total) into the skin 2 (two) times daily. 42 units in the morning and 30 units at bedtime  . [DISCONTINUED] nystatin (MYCOSTATIN) powder Apply topically as directed. As needed under breasts for irritation  . [DISCONTINUED] oxybutynin (DITROPAN) 5 MG tablet Take 1 tablet (5 mg total) by mouth 2 (two) times daily.  . [DISCONTINUED] potassium chloride (MICRO-K) 10 MEQ CR capsule Take 1 capsule (10  mEq total) by mouth daily.    Review of Systems  Constitutional: Negative.   HENT: Negative.   Eyes: Negative.   Respiratory: Negative.   Cardiovascular: Negative.   Gastrointestinal: Negative.   Endocrine: Negative.   Genitourinary: Negative.   Musculoskeletal: Positive for arthralgias.  Skin: Negative.   Allergic/Immunologic: Negative.   Neurological: Negative.   Hematological: Negative.   Psychiatric/Behavioral: Positive for confusion. The patient is nervous/anxious (crying alot).        Objective:   Physical Exam  Nursing note and vitals reviewed. Constitutional: She is oriented to person, place, and time. She appears well-developed and well-nourished. She appears distressed.  Patient comes to the visit today in her wheelchair and is morbidly obese and tearful during the visit  HENT:  Right Ear: External ear normal.  Left Ear: External ear normal.  Nose: Nose normal.  Mouth/Throat: Oropharynx is clear and moist.  Eyes: Conjunctivae and EOM are normal. Pupils are equal, round, and reactive to light. Right eye exhibits no discharge. Left eye exhibits no discharge. No scleral icterus.  Neck: Normal range of motion. Neck supple. No thyromegaly present.  Cardiovascular: Normal rate, regular rhythm, normal heart sounds and intact distal pulses.  Exam reveals no gallop and no friction rub.   No murmur heard. The rhythm is regular with no atrial fibrillation at 60 per minute  Pulmonary/Chest: Effort normal and breath sounds normal. No respiratory distress. She has no wheezes. She has no rales. She exhibits no tenderness.  The breath sounds are diminished but there is no wheezing or chest  Abdominal: Soft. Bowel sounds are normal. She exhibits no mass. There is no tenderness. There is no rebound and no guarding.  The abdomen is morbidly obese and there was no indication of masses or tenderness with palpation.  Musculoskeletal: Normal range of motion. She exhibits no edema and no  tenderness.  Lymphadenopathy:    She has no cervical adenopathy.  Neurological: She is alert and oriented to person, place, and time.  Skin: Skin is warm and dry. No rash noted.  Psychiatric: She has a normal mood and affect. Her behavior is normal. Judgment and thought content normal.  Depressed   BP 134/75  Pulse 63  Temp(Src) 96.7 F (35.9 C) (Oral)  Ht $R'5\' 4"'JM$  (1.626 m) Because of the patient's concern with her memory she was given MMSE score 29/30 on this.       Assessment & Plan:  1. Atrial fibrillation - POCT CBC - BMP8+EGFR - Hepatic function panel  2. DIABETES, TYPE 2 - POCT CBC - POCT glycosylated hemoglobin (Hb A1C) - POCT UA - Microalbumin  3. GAD (generalized anxiety disorder) - POCT CBC -Will discuss changing and bridging Effexor to Cymbalta after discussing with clinical pharmacist  4. Hyperlipemia - POCT CBC - BMP8+EGFR - Hepatic function panel - Lipid panel  5. HYPOTHYROIDISM - POCT CBC - Thyroid Panel With TSH  6. Vitamin D deficiency - Vit D  25 hydroxy (rtn osteoporosis monitoring)  7.  A-fib - POCT INR  8. Morbid obesity -Continue to watch diet as closely as possible and get as much exercise as possible  9. Confusion  10. Depression  11. Memory impairment  12. High risk medication use  Meds ordered this encounter  Medications  . insulin detemir (LEVEMIR) 100 UNIT/ML injection    Sig: Inject into the skin at bedtime. 42 units AM and 30 units QHS  . clonazePAM (KLONOPIN) 1 MG tablet    Sig: Take 1 tablet (1 mg total) by mouth 3 (three) times daily as needed for anxiety.    Dispense:  90 tablet    Refill:  1   Patient Instructions                       Medicare Annual Wellness Visit  [place] and the medical providers at [facility] strive to bring you the best medical care.  In doing so we not only want to address your current medical conditions and concerns but also to detect new conditions early and  prevent illness, disease and health-related problems.    Medicare offers a yearly Wellness Visit which allows our clinical staff to assess your need for preventative services including immunizations, lifestyle education, counseling to decrease risk of preventable diseases and screening for fall risk and other medical concerns.    This visit is provided free of charge (no copay) for all Medicare recipients. The clinical pharmacists at [facility] have begun to conduct these Wellness Visits which will also include a thorough review of all your medications.    As you primary medical provider recommend that you make an appointment for your Annual Wellness Visit if you have not done so already this year.  You may set up this appointment before you leave today or you may call back [phone]) and schedule an appointment.  Please make sure when you call that you mention that you are scheduling your Annual Wellness Visit with the clinical pharmacist so that the appointment may be made for the proper length of time.      Continue current medications. Continue good therapeutic lifestyle changes which include good diet and exercise. Fall precautions discussed with patient. If an FOBT was given today- please return it to our front desk. If you are over 46 years old - you may need Prevnar 13 or the adult Pneumonia vaccine.  We will adjust your Coumadin tomorrow We will we will also discuss with you how to approach coming off the Effexor and starting the Cymbalta generic Continue to drink plenty of fluid Check blood sugars as regularly as possible at home Please bring readings to the office for [place] to review   Arrie Senate MD

## 2013-10-10 NOTE — Patient Instructions (Addendum)
Medicare Annual Wellness Visit  [facility] and the medical providers at [facility] strive to bring you the best medical care.  In doing so we not only want to address your current medical conditions and concerns but also to detect new conditions early and prevent illness, disease and health-related problems.    Medicare offers a yearly Wellness Visit which allows our clinical staff to assess your need for preventative services including immunizations, lifestyle education, counseling to decrease risk of preventable diseases and screening for fall risk and other medical concerns.    This visit is provided free of charge (no copay) for all Medicare recipients. The clinical pharmacists at [facility] have begun to conduct these Wellness Visits which will also include a thorough review of all your medications.    As you primary medical provider recommend that you make an appointment for your Annual Wellness Visit if you have not done so already this year.  You may set up this appointment before you leave today or you may call back [phone] and schedule an appointment.  Please make sure when you call that you mention that you are scheduling your Annual Wellness Visit with the clinical pharmacist so that the appointment may be made for the proper length of time.      Continue current medications. Continue good therapeutic lifestyle changes which include good diet and exercise. Fall precautions discussed with patient. If an FOBT was given today- please return it to our front desk. If you are over 71 years old - you may need Prevnar 13 or the adult Pneumonia vaccine.  We will adjust your Coumadin tomorrow We will we will also discuss with you how to approach coming off the Effexor and starting the Cymbalta generic Continue to drink plenty of fluid Check blood sugars as regularly as possible at home Please bring readings to the office  for us to review

## 2013-10-11 ENCOUNTER — Telehealth: Payer: Self-pay | Admitting: Pharmacist

## 2013-10-11 ENCOUNTER — Telehealth: Payer: Self-pay | Admitting: Family Medicine

## 2013-10-11 LAB — LIPID PANEL
Chol/HDL Ratio: 6 ratio units — ABNORMAL HIGH (ref 0.0–4.4)
Cholesterol, Total: 197 mg/dL (ref 100–199)
HDL: 33 mg/dL — AB (ref >39)
LDL Calculated: 122 mg/dL — ABNORMAL HIGH (ref 0–99)
TRIGLYCERIDES: 212 mg/dL — AB (ref 0–149)
VLDL Cholesterol Cal: 42 mg/dL — ABNORMAL HIGH (ref 5–40)

## 2013-10-11 LAB — BMP8+EGFR
BUN / CREAT RATIO: 29 — AB (ref 11–26)
BUN: 17 mg/dL (ref 8–27)
CALCIUM: 9.3 mg/dL (ref 8.7–10.3)
CHLORIDE: 96 mmol/L — AB (ref 97–108)
CO2: 26 mmol/L (ref 18–29)
Creatinine, Ser: 0.59 mg/dL (ref 0.57–1.00)
GFR calc Af Amer: 107 mL/min/{1.73_m2} (ref >59)
GFR calc non Af Amer: 93 mL/min/{1.73_m2} (ref >59)
Glucose: 204 mg/dL — ABNORMAL HIGH (ref 65–99)
Potassium: 4.1 mmol/L (ref 3.5–5.2)
SODIUM: 141 mmol/L (ref 134–144)

## 2013-10-11 LAB — THYROID PANEL WITH TSH
Free Thyroxine Index: 2.4 (ref 1.2–4.9)
T3 UPTAKE RATIO: 28 % (ref 24–39)
T4, Total: 8.5 ug/dL (ref 4.5–12.0)
TSH: 1.24 u[IU]/mL (ref 0.450–4.500)

## 2013-10-11 LAB — HEPATIC FUNCTION PANEL
ALT: 18 IU/L (ref 0–32)
AST: 21 IU/L (ref 0–40)
Albumin: 3.8 g/dL (ref 3.5–4.8)
Alkaline Phosphatase: 99 IU/L (ref 39–117)
Bilirubin, Direct: 0.14 mg/dL (ref 0.00–0.40)
Total Bilirubin: 0.5 mg/dL (ref 0.0–1.2)
Total Protein: 6.6 g/dL (ref 6.0–8.5)

## 2013-10-11 LAB — VITAMIN D 25 HYDROXY (VIT D DEFICIENCY, FRACTURES): Vit D, 25-Hydroxy: 29.4 ng/mL — ABNORMAL LOW (ref 30.0–100.0)

## 2013-10-11 MED ORDER — VENLAFAXINE HCL ER 150 MG PO CP24: 150.0000 mg | ORAL_CAPSULE | Freq: Every day | ORAL | Status: DC

## 2013-10-11 NOTE — Addendum Note (Signed)
Addended by: Magdalene RiverBULLINS, Ernisha Sorn H on: 10/11/2013 03:12 PM   Modules accepted: Orders

## 2013-10-11 NOTE — Telephone Encounter (Signed)
Called - [employer] and they did not know what Mrs. Mckinley JewelHazelwood was referring to. Usually [facility] handles her INR reports.  ALso INR was taken in office yesterday 10/10/13.  No need for reporting to MD INR. Recommend take 5mg  today, then increase to 2.5mg  1 tablet daily.  Recheck INR in 1 week.  Also switch from venlafaxine 37.5mg  5 per day - to venlafaxine XR 150mg  1 capsule daily.

## 2013-10-12 ENCOUNTER — Telehealth: Payer: Self-pay | Admitting: *Deleted

## 2013-10-12 NOTE — Telephone Encounter (Signed)
Patient called to give INR results. Advised her number today is 1.7. Per note from yesterday it looks like patient was supposed to recheck in one week. FYI

## 2013-10-12 NOTE — Telephone Encounter (Signed)
Patient is advised to continue as planned to take 2.5mg  daily and recheck INR in 1 week.

## 2013-10-12 NOTE — Telephone Encounter (Signed)
Venlafaxin was changed to XL 150mg  once daily.  Cost will be $28 to patient for 90 days supply.

## 2013-10-16 ENCOUNTER — Other Ambulatory Visit: Payer: Self-pay | Admitting: Family Medicine

## 2013-10-17 ENCOUNTER — Other Ambulatory Visit: Payer: Self-pay | Admitting: Family Medicine

## 2013-10-20 ENCOUNTER — Telehealth: Payer: Self-pay | Admitting: Family Medicine

## 2013-10-20 NOTE — Telephone Encounter (Signed)
Message copied by Azalee CourseFULP, ASHLEY on Fri Oct 20, 2013 11:57 AM ------      Message from: Ernestina PennaMOORE, DONALD W      Created: Wed Oct 11, 2013  5:15 PM       Please let patient know the results of this lab work ------

## 2013-10-25 ENCOUNTER — Telehealth: Payer: Self-pay | Admitting: *Deleted

## 2013-10-25 ENCOUNTER — Ambulatory Visit (INDEPENDENT_AMBULATORY_CARE_PROVIDER_SITE_OTHER): Payer: Medicare Other | Admitting: Pharmacist

## 2013-10-25 DIAGNOSIS — I4891 Unspecified atrial fibrillation: Secondary | ICD-10-CM

## 2013-10-25 LAB — POCT INR: INR: 2

## 2013-10-25 NOTE — Patient Instructions (Signed)
No charge for labs or visit - INR check through home monitoring system  

## 2013-10-25 NOTE — Telephone Encounter (Signed)
Patient to continue current warfarin dose of 2.5mg  daily (=1/2 tablet).  Recheck in 1 week.

## 2013-10-25 NOTE — Telephone Encounter (Signed)
[name] called in her INR 2.0

## 2013-11-02 ENCOUNTER — Telehealth: Payer: Self-pay | Admitting: Family Medicine

## 2013-11-02 ENCOUNTER — Ambulatory Visit (INDEPENDENT_AMBULATORY_CARE_PROVIDER_SITE_OTHER): Payer: Medicare Other | Admitting: Pharmacist

## 2013-11-02 DIAGNOSIS — I4891 Unspecified atrial fibrillation: Secondary | ICD-10-CM

## 2013-11-02 LAB — POCT INR: INR: 1.5

## 2013-11-02 NOTE — Progress Notes (Signed)
No charge for labs or visit - INR check through home monitoring system  

## 2013-11-02 NOTE — Telephone Encounter (Signed)
Patient called - see anticoagulation note.

## 2013-11-03 ENCOUNTER — Encounter: Payer: Self-pay | Admitting: *Deleted

## 2013-11-06 ENCOUNTER — Telehealth: Payer: Self-pay | Admitting: Family Medicine

## 2013-11-06 ENCOUNTER — Other Ambulatory Visit: Payer: Self-pay

## 2013-11-06 MED ORDER — NYSTATIN 100000 UNIT/GM EX POWD: 60.0000 g | CUTANEOUS | Status: DC

## 2013-11-07 ENCOUNTER — Other Ambulatory Visit: Payer: Self-pay

## 2013-11-07 MED ORDER — VITAMIN D (ERGOCALCIFEROL) 1.25 MG (50000 UNIT) PO CAPS: 50000.0000 [IU] | ORAL_CAPSULE | ORAL | Status: DC

## 2013-11-07 NOTE — Telephone Encounter (Signed)
She did not qualify, [place]

## 2013-11-07 NOTE — Telephone Encounter (Signed)
Last seen and last Vit D level 10/10/13  29.4 Low DWM

## 2013-11-07 NOTE — Telephone Encounter (Signed)
Patient called again and said never mind about the cymbalta she is going to stay on the effexor.

## 2013-11-08 LAB — POCT INR: INR: 1.8

## 2013-11-09 ENCOUNTER — Other Ambulatory Visit: Payer: Self-pay | Admitting: Family Medicine

## 2013-11-09 ENCOUNTER — Telehealth: Payer: Self-pay | Admitting: *Deleted

## 2013-11-09 ENCOUNTER — Ambulatory Visit: Payer: Self-pay | Admitting: Pharmacist

## 2013-11-09 DIAGNOSIS — I4891 Unspecified atrial fibrillation: Secondary | ICD-10-CM

## 2013-11-09 NOTE — Telephone Encounter (Signed)
Patient called - instructed to take 1 tablet of warfarin today (=5mg ) then resume 1/2 tablet daily.  Recheck INR in 1 week.

## 2013-11-09 NOTE — Telephone Encounter (Signed)
Pt did her protime last night at 11:25 PM. She just called it in, INR 1.8

## 2013-11-09 NOTE — Progress Notes (Signed)
No charge for labs or visit - INR check through home monitoring system  

## 2013-11-10 NOTE — Telephone Encounter (Signed)
This was a week ago and no return call

## 2013-11-22 ENCOUNTER — Telehealth: Payer: Self-pay | Admitting: Pharmacist

## 2013-11-22 NOTE — Telephone Encounter (Signed)
I had spoken with Massie MaroonMandy - [employer] representative about 10 days ago regarding Romualdo BolkNancy Harbin's supplies for checking INR at home.  Mrs. Mckinley JewelHazelwood has been checking at home about about 1 and 1/2 year through a program / contract [employer] has with [facility].  Angelica ChessmanMandy was to check into situation and get back to me but I haven't heard from her yet.

## 2013-11-23 ENCOUNTER — Ambulatory Visit (INDEPENDENT_AMBULATORY_CARE_PROVIDER_SITE_OTHER): Payer: Medicare Other | Admitting: Pharmacist

## 2013-11-23 DIAGNOSIS — I4891 Unspecified atrial fibrillation: Secondary | ICD-10-CM

## 2013-11-23 LAB — POCT INR: INR: 2.5

## 2013-11-23 NOTE — Telephone Encounter (Signed)
Mandy left message on clinical pharmacist voicemail that she is still working to find out more information about Mrs. Penland's supplies - she is hoping to have an answer for us with in the next 24 to 48 hours.

## 2013-11-23 NOTE — Progress Notes (Signed)
No charge for labs or visit - INR check through home monitoring system  

## 2013-12-06 ENCOUNTER — Telehealth: Payer: Self-pay | Admitting: Family Medicine

## 2013-12-06 MED ORDER — FLUCONAZOLE 150 MG PO TABS: 150.0000 mg | ORAL_TABLET | Freq: Once | ORAL | Status: DC

## 2013-12-06 NOTE — Telephone Encounter (Signed)
I have found out from [employer] that [name]'s insurance is not in network with [employer].  They are not able to provide home INR services for her anymore. I spoke with patient about alternatives which includes - switching to new anticoagulant that does not require monthly monitoring (patient is concerned about cost of medication and coverage gap) or continuing warfarin and coming into office for INR checks (patient is concerned about family being able to bring her).  She is going to discuss with her sons and let us know what she would like to do.     **patient also requests Rx for something for yeast infection - Rx sent in for diflucan 150mg  1 tablet for 1 dose

## 2013-12-18 ENCOUNTER — Other Ambulatory Visit: Payer: Self-pay | Admitting: Nurse Practitioner

## 2013-12-18 ENCOUNTER — Other Ambulatory Visit: Payer: Self-pay | Admitting: Family Medicine

## 2013-12-18 NOTE — Telephone Encounter (Signed)
Looks like she is on multiple NSAIDs

## 2013-12-19 ENCOUNTER — Telehealth: Payer: Self-pay | Admitting: Pharmacist Clinician (PhC)/ Clinical Pharmacy Specialist

## 2013-12-19 NOTE — Telephone Encounter (Signed)
Marcelino Duster talked to patient on the phone.

## 2013-12-29 ENCOUNTER — Other Ambulatory Visit: Payer: Self-pay | Admitting: Family Medicine

## 2014-01-01 NOTE — Telephone Encounter (Signed)
Please call in klonopin with 1 refills 

## 2014-01-01 NOTE — Telephone Encounter (Signed)
Last seen 10/10/13, last filled 11/27/13. [facility]

## 2014-01-02 NOTE — Telephone Encounter (Signed)
Phoned in.

## 2014-01-17 ENCOUNTER — Telehealth: Payer: Self-pay | Admitting: Pharmacist

## 2014-01-17 MED ORDER — RIVAROXABAN 20 MG PO TABS: 20.0000 mg | ORAL_TABLET | Freq: Every day | ORAL | Status: DC

## 2014-01-17 NOTE — Telephone Encounter (Signed)
Discontinue warfarin  Start Xarelto 20mg  1 tablet daily in the evening with supper.  #45 samples left for patient.

## 2014-01-21 ENCOUNTER — Ambulatory Visit (INDEPENDENT_AMBULATORY_CARE_PROVIDER_SITE_OTHER): Payer: Medicare Other | Admitting: Pharmacist

## 2014-01-21 DIAGNOSIS — I480 Paroxysmal atrial fibrillation: Secondary | ICD-10-CM

## 2014-01-21 DIAGNOSIS — I4891 Unspecified atrial fibrillation: Secondary | ICD-10-CM

## 2014-01-30 ENCOUNTER — Telehealth: Payer: Self-pay | Admitting: Pharmacist

## 2014-01-30 ENCOUNTER — Telehealth: Payer: Self-pay | Admitting: Family Medicine

## 2014-01-30 NOTE — Telephone Encounter (Signed)
Please discuss with this patient how best to take the Effexor especially in conjunction with all her other medications

## 2014-01-30 NOTE — Telephone Encounter (Signed)
Patient instructed to take with food - can take in morning if it causes her sleeplessness.

## 2014-01-30 NOTE — Telephone Encounter (Signed)
Ok to take Levemir and Novolog at same time as long as she is not injecting in same area.   Reviewed Long vs short acting insulin principles.  I also noticed that she has not picked up samples of Xarelto yet.  Patient says her sons ar going to pick up today or tomorrow. She has been taking warfarin until she start Xarelto.

## 2014-02-01 NOTE — Telephone Encounter (Signed)
Message left on son - Physicist, medicalMichael's voicemail.

## 2014-02-09 ENCOUNTER — Other Ambulatory Visit: Payer: Self-pay | Admitting: Family Medicine

## 2014-02-12 ENCOUNTER — Other Ambulatory Visit: Payer: Self-pay | Admitting: *Deleted

## 2014-02-12 MED ORDER — FLUTICASONE PROPIONATE 50 MCG/ACT NA SUSP: NASAL | Status: DC

## 2014-02-15 ENCOUNTER — Other Ambulatory Visit: Payer: Self-pay | Admitting: Family Medicine

## 2014-02-16 ENCOUNTER — Other Ambulatory Visit: Payer: Self-pay

## 2014-02-28 ENCOUNTER — Ambulatory Visit: Payer: Medicare Other | Admitting: Family Medicine

## 2014-03-02 ENCOUNTER — Encounter: Payer: Self-pay | Admitting: Family Medicine

## 2014-03-02 ENCOUNTER — Ambulatory Visit (INDEPENDENT_AMBULATORY_CARE_PROVIDER_SITE_OTHER): Payer: Medicare Other | Admitting: Family Medicine

## 2014-03-02 VITALS — BP 141/73 | HR 70 | Temp 97.1°F | Ht 64.0 in | Wt 304.0 lb

## 2014-03-02 DIAGNOSIS — E119 Type 2 diabetes mellitus without complications: Secondary | ICD-10-CM

## 2014-03-02 DIAGNOSIS — F411 Generalized anxiety disorder: Secondary | ICD-10-CM

## 2014-03-02 DIAGNOSIS — E559 Vitamin D deficiency, unspecified: Secondary | ICD-10-CM

## 2014-03-02 DIAGNOSIS — F329 Major depressive disorder, single episode, unspecified: Secondary | ICD-10-CM

## 2014-03-02 DIAGNOSIS — E785 Hyperlipidemia, unspecified: Secondary | ICD-10-CM

## 2014-03-02 DIAGNOSIS — I10 Essential (primary) hypertension: Secondary | ICD-10-CM

## 2014-03-02 DIAGNOSIS — F32A Depression, unspecified: Secondary | ICD-10-CM

## 2014-03-02 DIAGNOSIS — I4891 Unspecified atrial fibrillation: Secondary | ICD-10-CM

## 2014-03-02 DIAGNOSIS — I480 Paroxysmal atrial fibrillation: Secondary | ICD-10-CM

## 2014-03-02 DIAGNOSIS — E039 Hypothyroidism, unspecified: Secondary | ICD-10-CM

## 2014-03-02 DIAGNOSIS — F3289 Other specified depressive episodes: Secondary | ICD-10-CM

## 2014-03-02 LAB — POCT CBC
GRANULOCYTE PERCENT: 67.8 % (ref 37–80)
HEMATOCRIT: 41.2 % (ref 37.7–47.9)
Hemoglobin: 13.5 g/dL (ref 12.2–16.2)
LYMPH, POC: 2.2 (ref 0.6–3.4)
MCH: 30.9 pg (ref 27–31.2)
MCHC: 32.7 g/dL (ref 31.8–35.4)
MCV: 94.7 fL (ref 80–97)
MPV: 7.2 fL (ref 0–99.8)
POC Granulocyte: 5.9 (ref 2–6.9)
POC LYMPH %: 25.8 % (ref 10–50)
Platelet Count, POC: 195 10*3/uL (ref 142–424)
RBC: 4.4 M/uL (ref 4.04–5.48)
RDW, POC: 13.4 %
WBC: 8.7 10*3/uL (ref 4.6–10.2)

## 2014-03-02 LAB — POCT GLYCOSYLATED HEMOGLOBIN (HGB A1C): Hemoglobin A1C: 8.7

## 2014-03-02 LAB — POCT INR: INR: 2.4

## 2014-03-02 MED ORDER — SOTALOL HCL 80 MG PO TABS: ORAL_TABLET | ORAL | Status: DC

## 2014-03-02 MED ORDER — AZELASTINE HCL 0.1 % NA SOLN: 1.0000 | Freq: Two times a day (BID) | NASAL | Status: DC

## 2014-03-02 NOTE — Patient Instructions (Addendum)
Medicare Annual Wellness Visit  [facility] and the medical providers at [facility] strive to bring you the best medical care.  In doing so we not only want to address your current medical conditions and concerns but also to detect new conditions early and prevent illness, disease and health-related problems.    Medicare offers a yearly Wellness Visit which allows our clinical staff to assess your need for preventative services including immunizations, lifestyle education, counseling to decrease risk of preventable diseases and screening for fall risk and other medical concerns.    This visit is provided free of charge (no copay) for all Medicare recipients. The clinical pharmacists at [facility] have begun to conduct these Wellness Visits which will also include a thorough review of all your medications.    As you primary medical provider recommend that you make an appointment for your Annual Wellness Visit if you have not done so already this year.  You may set up this appointment before you leave today or you may call back [phone] and schedule an appointment.  Please make sure when you call that you mention that you are scheduling your Annual Wellness Visit with the clinical pharmacist so that the appointment may be made for the proper length of time.     Continue current medications. Continue good therapeutic lifestyle changes which include good diet and exercise. Fall precautions discussed with patient. If an FOBT was given today- please return it to our front desk. If you are over 71 years old - you may need Prevnar 13 or the adult Pneumonia vaccine.  Flu Shots will be available at our office starting mid- September. Please call and schedule a FLU CLINIC APPOINTMENT.   Follow up with Tammy for your diabetes, take your new blood thinner medicine as directed starting tomorrow evening with dinner(xarelto) Please call us  for additional samples when you have to remaining bottles We will set you up for appt with Dr Antoine PocheHochrein (cardiologist) We will arrange Home Health to come in for your assistance You must try to do better with diet and exercise Hopefully the home health nurse can help encourage better living habits

## 2014-03-02 NOTE — Progress Notes (Signed)
Subjective:    Patient ID: [name], female    DOB: 1940-09-20, 73 y.o.   MRN: [id number]  HPI Pt here for follow up and management of chronic medical problems. The patient comes to the visit today with her son. She is in a wheelchair. She complains of continued back pain, and sinus problems. She also continues to have her depression. A depression screen was performed and she is taking medication for her depression. She is also due to get lab work today and some of this has already been resulted. It will be in the chart. The patient admits that she does not watch her diet closely she has bad daily habits of not getting enough exercise. The son acknowledged this. He is very hard to get her motivated to take better care of herself appear         Patient Active Problem List   Diagnosis Date Noted  . Memory impairment 10/10/2013  . High risk medication use 10/10/2013  . Type 2 diabetes mellitus 01/26/2013  . Hypothyroid 01/26/2013  . Hypertension 01/26/2013  . Hyperlipemia 01/26/2013  . Hypercapnia 01/08/2013  . CAP (community acquired pneumonia) 01/08/2013  . Chronic respiratory failure 01/08/2013  . A-fib 09/28/2012  . Congestive heart failure 07/22/2012  . Hypokalemia 07/22/2012  . GAD (generalized anxiety disorder) 07/22/2012  . Depression 07/22/2012  . Atrial fibrillation 07/22/2012  . Chronic low back pain 07/22/2012  . Diabetic neuropathy 07/22/2012  . Migraine 07/22/2012  . Dyspnea 11/26/2010  . Morbid obesity 06/04/2010  . SUPRAVENTRICULAR TACHYCARDIA 06/04/2010  . HYPOTHYROIDISM 04/22/2008  . DIABETES, TYPE 2 04/22/2008  . ALLERGIC RHINITIS 04/22/2008  . SLEEP APNEA 04/22/2008  . COUGH 04/22/2008  . RESTLESS LEGS SYNDROME 04/12/2008  . HYPERSOMNIA 04/12/2008   Outpatient Encounter Prescriptions as of 03/02/2014  Medication Sig  . clonazePAM (KLONOPIN) 1 MG tablet TAKE 1 TABLET 3 TIMES A DAY PRN FOR ANXIETY  . fluticasone (FLONASE) 50 MCG/ACT nasal spray  USE 2 SPRAYS INTO THE NOSE DAILY.  . furosemide (LASIX) 40 MG tablet Take 40 mg by mouth daily.  Marland Kitchen glucose blood (ONE TOUCH ULTRA TEST) test strip Test 4X a day and prn  Dx 250.02  . insulin aspart (NOVOLOG) 100 UNIT/ML injection Inject 4-16 Units into the skin 3 (three) times daily before meals. Use per sliding scale  . insulin detemir (LEVEMIR) 100 UNIT/ML injection Inject into the skin at bedtime. 42 units AM and 30 units QHS  . levothyroxine (SYNTHROID, LEVOTHROID) 75 MCG tablet TAKE 1 TABLET (75 MCG TOTAL) BY MOUTH DAILY.  Marland Kitchen NITROSTAT 0.4 MG SL tablet USE AS DIRECTED  . nystatin (MYCOSTATIN/NYSTOP) 100000 UNIT/GM POWD Apply 60 g topically as directed.  Marland Kitchen oxyCODONE-acetaminophen (PERCOCET) 10-325 MG per tablet Take 1 tablet by mouth 4 (four) times daily as needed for pain.   . polyethylene glycol powder (GLYCOLAX/MIRALAX) powder Take 17 g by mouth daily.  . potassium chloride (MICRO-K) 10 MEQ CR capsule TAKE 1 CAPSULE (10 MEQ TOTAL) BY MOUTH DAILY.  . pravastatin (PRAVACHOL) 80 MG tablet Take 1 tablet (80 mg total) by mouth daily.  . pravastatin (PRAVACHOL) 80 MG tablet TAKE 1 TABLET (80 MG TOTAL) BY MOUTH DAILY.  . rivaroxaban (XARELTO) 20 MG TABS tablet Take 1 tablet (20 mg total) by mouth daily with supper.  . sotalol (BETAPACE) 80 MG tablet TAKE 1 AND 1/2 TABLET TWICE A DAY  . venlafaxine XR (EFFEXOR-XR) 150 MG 24 hr capsule Take 1 capsule (150 mg total) by mouth daily  with breakfast.  . Vitamin D, Ergocalciferol, (DRISDOL) 50000 UNITS CAPS capsule Take 1 capsule (50,000 Units total) by mouth every 7 (seven) days.  Marland Kitchen warfarin (COUMADIN) 5 MG tablet Take 1 tablet by mouth daily. As directed  . [DISCONTINUED] fluconazole (DIFLUCAN) 150 MG tablet Take 1 tablet (150 mg total) by mouth once.    Review of Systems  Constitutional: Negative.   HENT: Positive for postnasal drip and sinus pressure.   Eyes: Negative.   Respiratory: Negative.   Cardiovascular: Negative.   Gastrointestinal:  Negative.   Endocrine: Negative.   Genitourinary: Negative.   Musculoskeletal: Positive for back pain.  Skin: Negative.   Allergic/Immunologic: Negative.   Neurological: Negative.   Hematological: Negative.   Psychiatric/Behavioral: The patient is nervous/anxious.        Objective:   Physical Exam  Nursing note and vitals reviewed. Constitutional: She is oriented to person, place, and time. She appears well-developed and well-nourished. She appears distressed.  Distress and tearful as usual she comes to the visit in her wheelchair today. Her son is with her during the visit. She is alert and smiling at times appear  HENT:  Head: Normocephalic and atraumatic.  Right Ear: External ear normal.  Left Ear: External ear normal.  Mouth/Throat: Oropharynx is clear and moist. No oropharyngeal exudate.  There is nasal congestion bilaterally. She has nasal O2 in place  Eyes: Conjunctivae and EOM are normal. Pupils are equal, round, and reactive to light. Right eye exhibits no discharge. Left eye exhibits no discharge. No scleral icterus.  Neck: Normal range of motion. Neck supple. No thyromegaly present.  There are no carotid bruits or adenopathy  Cardiovascular: Normal rate, regular rhythm, normal heart sounds and intact distal pulses.  Exam reveals no gallop and no friction rub.   No murmur heard. The rhythm is regular today at 72 per minute  Pulmonary/Chest: Effort normal and breath sounds normal. No respiratory distress. She has no wheezes. She has no rales. She exhibits no tenderness.  Lungs sounds are distant do to patient size.  Abdominal: Soft. Bowel sounds are normal. She exhibits no mass. There is no tenderness. There is no rebound and no guarding.  There is morbid obesity of her abdomen. The patient was examined in the wheelchair because she was unable to get on the table. There was no specific tenderness nor was there any masses palpated.  Musculoskeletal: She exhibits no edema and  no tenderness.  The patient has limited range of motion due to the her size and use of oxygen.  Lymphadenopathy:    She has no cervical adenopathy.  Neurological: She is alert and oriented to person, place, and time. She has normal reflexes. No cranial nerve deficit.  Skin: Skin is warm and dry.  The skin of the lower extremities was very dry  Psychiatric: Her behavior is normal. Judgment and thought content normal.  Patient is tearful and depressed as usual. She did have moments of smiling    BP 141/73  Pulse 70  Temp(Src) 97.1 F (36.2 C) (Oral)  Ht _0  (1.626 m)  Wt 304 lb (137.893 kg)  BMI 52.16 kg/m2 Results for orders placed in visit on 03/02/14  POCT CBC      Result Value Ref Range   WBC 8.7  4.6 - 10.2 K/uL   Lymph, poc 2.2  0.6 - 3.4   POC LYMPH PERCENT 25.8  10 - 50 %L   POC Granulocyte 5.9  2 - 6.9   Granulocyte percent  67.8  37 - 80 %G   RBC 4.4  4.04 - 5.48 M/uL   Hemoglobin 13.5  12.2 - 16.2 g/dL   HCT, POC 41.2  37.7 - 47.9 %   MCV 94.7  80 - 97 fL   MCH, POC 30.9  27 - 31.2 pg   MCHC 32.7  31.8 - 35.4 g/dL   RDW, POC 13.4     Platelet Count, POC 195.0  142 - 424 K/uL   MPV 7.2  0 - 99.8 fL  POCT GLYCOSYLATED HEMOGLOBIN (HGB A1C)      Result Value Ref Range   Hemoglobin A1C 8.7     The patient was made aware of these lab results before she left the office.       Assessment & Plan:  1. Paroxysmal atrial fibrillation - POCT CBC - POCT INR; Standing - POCT INR - Ambulatory referral to Cardiology  2. Depression - POCT CBC  3. DIABETES, TYPE 2 - POCT CBC - POCT glycosylated hemoglobin (Hb A1C) - BMP8+EGFR - Ambulatory referral to Cardiology  4. GAD (generalized anxiety disorder) - POCT CBC  5. Hyperlipemia - POCT CBC - Lipid panel - Ambulatory referral to Cardiology  6. Essential hypertension - POCT CBC - BMP8+EGFR - Hepatic function panel - Ambulatory referral to Cardiology  7. HYPOTHYROIDISM - POCT CBC  8. Type 2 diabetes  mellitus without complication - POCT CBC  9. Vitamin D deficiency - Vit D  25 hydroxy (rtn osteoporosis monitoring)  10. Morbid obesity  Meds ordered this encounter  Medications  . DISCONTD: warfarin (COUMADIN) 5 MG tablet    Sig: Take 1 tablet by mouth daily. As directed  . azelastine (ASTELIN) 0.1 % nasal spray    Sig: Place 1 spray into both nostrils 2 (two) times daily. Use in each nostril as directed    Dispense:  30 mL    Refill:  12  . sotalol (BETAPACE) 80 MG tablet    Sig: TAKE 1 AND 1/2 TABLET TWICE A DAY    Dispense:  90 tablet    Refill:  1   Patient Instructions                       Medicare Annual Wellness Visit  [facility] and the medical providers at [facility] strive to bring you the best medical care.  In doing so we not only want to address your current medical conditions and concerns but also to detect new conditions early and prevent illness, disease and health-related problems.    Medicare offers a yearly Wellness Visit which allows our clinical staff to assess your need for preventative services including immunizations, lifestyle education, counseling to decrease risk of preventable diseases and screening for fall risk and other medical concerns.    This visit is provided free of charge (no copay) for all Medicare recipients. The clinical pharmacists at [facility] have begun to conduct these Wellness Visits which will also include a thorough review of all your medications.    As you primary medical provider recommend that you make an appointment for your Annual Wellness Visit if you have not done so already this year.  You may set up this appointment before you leave today or you may call back ([phone]) and schedule an appointment.  Please make sure when you call that you mention that you are scheduling your Annual Wellness Visit with the clinical pharmacist so that the appointment may be  made for the proper  length of time.     Continue current medications. Continue good therapeutic lifestyle changes which include good diet and exercise. Fall precautions discussed with patient. If an FOBT was given today- please return it to our front desk. If you are over 28 years old - you may need Prevnar 17 or the adult Pneumonia vaccine.  Flu Shots will be available at our office starting mid- September. Please call and schedule a FLU CLINIC APPOINTMENT.   Follow up with Tammy for your diabetes, take your new blood thinner medicine as directed starting tomorrow evening with dinner(xarelto) Please call us for additional samples when you have to remaining bottles We will set you up for appt with Dr Percival Spanish (cardiologist) We will arrange Home Health to come in for your assistance You must try to do better with diet and exercise Hopefully the home health nurse can help encourage better living habits     Arrie Senate MD

## 2014-03-03 LAB — BMP8+EGFR
BUN / CREAT RATIO: 28 — AB (ref 11–26)
BUN: 16 mg/dL (ref 8–27)
CO2: 27 mmol/L (ref 18–29)
CREATININE: 0.57 mg/dL (ref 0.57–1.00)
Calcium: 9.1 mg/dL (ref 8.7–10.3)
Chloride: 96 mmol/L — ABNORMAL LOW (ref 97–108)
GFR calc non Af Amer: 94 mL/min/{1.73_m2} (ref >59)
GFR, EST AFRICAN AMERICAN: 108 mL/min/{1.73_m2} (ref >59)
Glucose: 269 mg/dL — ABNORMAL HIGH (ref 65–99)
Potassium: 4.1 mmol/L (ref 3.5–5.2)
Sodium: 139 mmol/L (ref 134–144)

## 2014-03-03 LAB — LIPID PANEL
Chol/HDL Ratio: 5.9 ratio units — ABNORMAL HIGH (ref 0.0–4.4)
Cholesterol, Total: 189 mg/dL (ref 100–199)
HDL: 32 mg/dL — AB (ref >39)
LDL Calculated: 118 mg/dL — ABNORMAL HIGH (ref 0–99)
Triglycerides: 197 mg/dL — ABNORMAL HIGH (ref 0–149)
VLDL Cholesterol Cal: 39 mg/dL (ref 5–40)

## 2014-03-03 LAB — HEPATIC FUNCTION PANEL
ALK PHOS: 85 IU/L (ref 39–117)
ALT: 16 IU/L (ref 0–32)
AST: 18 IU/L (ref 0–40)
Albumin: 3.6 g/dL (ref 3.5–4.8)
BILIRUBIN TOTAL: 0.3 mg/dL (ref 0.0–1.2)
Bilirubin, Direct: 0.1 mg/dL (ref 0.00–0.40)
Total Protein: 6.3 g/dL (ref 6.0–8.5)

## 2014-03-03 LAB — VITAMIN D 25 HYDROXY (VIT D DEFICIENCY, FRACTURES): Vit D, 25-Hydroxy: 28.3 ng/mL — ABNORMAL LOW (ref 30.0–100.0)

## 2014-03-03 NOTE — Addendum Note (Signed)
Addended by: Magdalene RiverBULLINS, JAMIE H on: 03/03/2014 08:44 AM   Modules accepted: Orders

## 2014-03-05 ENCOUNTER — Telehealth: Payer: Self-pay | Admitting: Family Medicine

## 2014-03-05 NOTE — Telephone Encounter (Signed)
Use Mycostatin powder 2-3 times a day

## 2014-03-06 ENCOUNTER — Telehealth: Payer: Self-pay | Admitting: *Deleted

## 2014-03-06 NOTE — Telephone Encounter (Signed)
done

## 2014-03-12 MED ORDER — NYSTATIN 100000 UNIT/GM EX POWD: 60.0000 g | CUTANEOUS | Status: DC

## 2014-03-12 NOTE — Telephone Encounter (Signed)
Patient has been using Nystatin and is seeing some improvement.  She will call back if symptoms persist or worsen.

## 2014-03-15 ENCOUNTER — Telehealth: Payer: Self-pay | Admitting: Family Medicine

## 2014-03-15 MED ORDER — DOXYCYCLINE HYCLATE 100 MG PO TABS: 100.0000 mg | ORAL_TABLET | Freq: Two times a day (BID) | ORAL | Status: DC

## 2014-03-15 NOTE — Telephone Encounter (Signed)
Med sent to pharmacy and patient aware. She will f/u if symptoms persist or worsen.

## 2014-03-15 NOTE — Telephone Encounter (Signed)
Doxycycline 100 mg twice daily for 2 weeks with food 

## 2014-03-16 ENCOUNTER — Other Ambulatory Visit: Payer: Self-pay | Admitting: Family Medicine

## 2014-03-16 ENCOUNTER — Ambulatory Visit: Payer: Medicare Other

## 2014-03-26 ENCOUNTER — Telehealth: Payer: Self-pay | Admitting: Family Medicine

## 2014-03-29 NOTE — Telephone Encounter (Signed)
Diabetic supplies ordered today, had to call [facility] to send request

## 2014-03-30 ENCOUNTER — Other Ambulatory Visit: Payer: Self-pay | Admitting: Family Medicine

## 2014-03-30 ENCOUNTER — Telehealth: Payer: Self-pay | Admitting: Family Medicine

## 2014-04-02 ENCOUNTER — Telehealth: Payer: Self-pay | Admitting: Pharmacist

## 2014-04-02 MED ORDER — INSULIN DETEMIR 100 UNIT/ML ~~LOC~~ SOLN: SUBCUTANEOUS | Status: DC

## 2014-04-02 MED ORDER — RIVAROXABAN 20 MG PO TABS: 20.0000 mg | ORAL_TABLET | Freq: Every day | ORAL | Status: DC

## 2014-04-02 NOTE — Telephone Encounter (Signed)
Spoke with patient on phone - she c/o having lots of nasal congestion related to CPAP.  She has not had adjusted or assessed in several years.  Will sent request to our home health department to check into getting [facility] to make home visit to assess.

## 2014-04-02 NOTE — Telephone Encounter (Signed)
Santina Evans from [facility] in [place] called regarding [name].  She received about 4-5 calls from her over the weekend.  [name] is confused about when to take medications and would like a chart of her medications and when to take.  Santina Evans was also concerned because patient told her she stopped her Effexor and that patient seemed more nervous and anxious on the phone.  Spent over 30 minutes on phone with patient discussing medications, CPAP, pain medication

## 2014-04-02 NOTE — Telephone Encounter (Signed)
I sent request to Almira Coaster in home health to see if [facility] will do a reassessment of her CPAP and change setting as needed.   I called Dr Ethelene Hal' office about pain medication - they are currently working on prior authorization and are aware that patient only has #2 oxycodone/APAP left.  Made chart for patient will all her medications and when to take - mail to her.  She was given phone number for [facility] office to help with insurance selection and also number for [facility] to help with transportation to dr's visit.

## 2014-04-03 ENCOUNTER — Ambulatory Visit (INDEPENDENT_AMBULATORY_CARE_PROVIDER_SITE_OTHER): Payer: Medicare Other | Admitting: Family Medicine

## 2014-04-03 ENCOUNTER — Encounter: Payer: Self-pay | Admitting: Family Medicine

## 2014-04-03 ENCOUNTER — Telehealth: Payer: Self-pay | Admitting: Nurse Practitioner

## 2014-04-03 VITALS — BP 118/68 | HR 80 | Temp 97.1°F | Ht 64.0 in | Wt 302.0 lb

## 2014-04-03 DIAGNOSIS — R609 Edema, unspecified: Secondary | ICD-10-CM

## 2014-04-03 MED ORDER — DOXYCYCLINE HYCLATE 100 MG PO TABS: 100.0000 mg | ORAL_TABLET | Freq: Two times a day (BID) | ORAL | Status: DC

## 2014-04-03 NOTE — Telephone Encounter (Signed)
120 blood sugar is normal- should not eat sugar to bring it up- should only eat sugar if it is below 50. Patient is coming in for an appointment this evening.

## 2014-04-03 NOTE — Progress Notes (Signed)
   Subjective:    Patient ID: [name], female    DOB: 12/28/1940, 73 y.o.   MRN: [id number]  HPI Patient presents with her son and c/o lower extremity edema.  She states in the past she has had abx's which help. She c/o cold feet an discomfort in her legs. She doesn't want to take any more dieuretic and she does not want to wear compression stockings.     Review of Systems    No chest pain, SOB, HA, dizziness, vision change, N/V, diarrhea, constipation, dysuria, urinary urgency or frequency, myalgias, arthralgias or rash.  Objective:   Physical Exam Vital signs noted  Chronically ill appearing obese female in WC  HEENT - Head atraumatic Normocephalic Respiratory - Lungs CTA bilateral Cardiac - RRR S1 and S2 without murmur GI - Abdomen soft Nontender and bowel sounds active x 4 Extremities - Venous stasis dermatitis and general pedal and pre-tibial edema. Toes are cold and PT and DP pulses are difficult to palpate.       Assessment & Plan:  Edema - Plan: Lower Extremity Arterial Doppler Bilateral, doxycycline (VIBRA-TABS) 100 MG tablet Po bid x 10 days.  She refuses compression stockings and she refuses anymore dieuretic. She is advised to keep her legs elevated.  Discomfort in lower extremity - ABI's  Deatra Canter FNP

## 2014-04-04 ENCOUNTER — Telehealth: Payer: Self-pay

## 2014-04-04 NOTE — Telephone Encounter (Signed)
Created medication list for patient and mail to her.  Recommended that she restart Effexor XR  1 capsule daily.  Also recommended increase Levemir insulin to 45 units qam and 32 units qpm since she is have BG readings over 200.  Patient reminded about s/s of hypoglycemia and how to treat to call office if experienced BG >200 or <70 more than 2 times per week for insulin adjustment.

## 2014-04-04 NOTE — Telephone Encounter (Signed)
They called patient to set up doppler's that you put an order in for and the patient said she knows nothing about this   She thought someone was coming to her house to do this

## 2014-04-05 NOTE — Telephone Encounter (Signed)
[facility] will call pt today and schedule appt to come out and check machine.

## 2014-04-05 NOTE — Telephone Encounter (Signed)
I discussed this with patient and the son because she didn't have good pulses in her feet and please call her son who agreed as well.  Patient can refuse but I recommend she get these done.  Thank-you

## 2014-04-11 DIAGNOSIS — E1149 Type 2 diabetes mellitus with other diabetic neurological complication: Secondary | ICD-10-CM

## 2014-04-11 DIAGNOSIS — I4891 Unspecified atrial fibrillation: Secondary | ICD-10-CM

## 2014-04-11 DIAGNOSIS — E1142 Type 2 diabetes mellitus with diabetic polyneuropathy: Secondary | ICD-10-CM

## 2014-04-11 DIAGNOSIS — I509 Heart failure, unspecified: Secondary | ICD-10-CM

## 2014-04-19 ENCOUNTER — Telehealth: Payer: Self-pay | Admitting: Family Medicine

## 2014-04-19 NOTE — Telephone Encounter (Signed)
Unable to leave message on either number. Cell number not working.  Samples of xarelto ready at front.

## 2014-04-20 ENCOUNTER — Telehealth: Payer: Self-pay | Admitting: Family Medicine

## 2014-04-20 NOTE — Telephone Encounter (Signed)
I discussed increase risk of blood clot when stopping Xarelto.  I suggested that she only hold Xarelto the evening prior to surgery if recommended by oral surgeon.  Restart the evening after surgery.   Patient also continues to have BG over 200 at least 4-5 timer per week.  Denies hypoglycemia.  Recommended that she increase Levemir to 48 units each morning and 35 units each evening.

## 2014-04-27 ENCOUNTER — Telehealth: Payer: Self-pay | Admitting: Family Medicine

## 2014-04-27 NOTE — Telephone Encounter (Signed)
I had spoken with Almira CoasterGina who handles Home Health in our office last week.  She had sent request to [facility] but was going to call to see where they were in process of going out to check [name]'s CPAP.   I explained to Mrs. Hogsett that I would forward message to Almira CoasterGina but that she is out for the next few days.  We will be in touch soon.

## 2014-04-27 NOTE — Telephone Encounter (Signed)
Patient calls back and says that she did get her dentist to call this in for her.

## 2014-04-27 NOTE — Telephone Encounter (Signed)
DWM - can we refill Clindamycin?  For dental procedure

## 2014-04-27 NOTE — Telephone Encounter (Signed)
This is okay to refill 

## 2014-04-30 ENCOUNTER — Telehealth: Payer: Self-pay | Admitting: Family Medicine

## 2014-04-30 NOTE — Telephone Encounter (Signed)
Spoke with [facility] and they stated that they had tried for days on numerous occassions to reach patient at home number and was unable to get her or leave a message. I gave them patients listed mobile number and they advised they would try and contact her that way.

## 2014-04-30 NOTE — Telephone Encounter (Signed)
This is okay to refill her antibiotic x1.

## 2014-05-01 ENCOUNTER — Telehealth: Payer: Self-pay | Admitting: Family Medicine

## 2014-05-01 ENCOUNTER — Other Ambulatory Visit: Payer: Self-pay | Admitting: *Deleted

## 2014-05-01 DIAGNOSIS — R609 Edema, unspecified: Secondary | ICD-10-CM

## 2014-05-01 MED ORDER — DOXYCYCLINE HYCLATE 100 MG PO TABS: 100.0000 mg | ORAL_TABLET | Freq: Two times a day (BID) | ORAL | Status: DC

## 2014-05-01 NOTE — Telephone Encounter (Signed)
It is okay to not stop the blood thinner for tomorrow, but she should call us back prior to the tooth extraction because we may need to hold it prior to that procedure. Please call us with the exact date

## 2014-05-01 NOTE — Telephone Encounter (Signed)
Dwm - please address tomorrow dental visit

## 2014-05-01 NOTE — Progress Notes (Signed)
Rx for Doxycycline okayed per Dr Leeanne DeedMoore Sent into pharmacy Pt notified

## 2014-05-02 NOTE — Telephone Encounter (Signed)
i left a detailed message on son's VM about this info i attempted several times to reach the pt

## 2014-05-04 ENCOUNTER — Telehealth: Payer: Self-pay | Admitting: Family Medicine

## 2014-05-05 NOTE — Telephone Encounter (Signed)
Please advise on how to wean off effexor and start cymbalta

## 2014-05-07 ENCOUNTER — Telehealth: Payer: Self-pay | Admitting: Family Medicine

## 2014-05-09 ENCOUNTER — Institutional Professional Consult (permissible substitution): Payer: Medicare Other | Admitting: Cardiology

## 2014-05-09 ENCOUNTER — Other Ambulatory Visit: Payer: Self-pay | Admitting: Family Medicine

## 2014-05-09 NOTE — Telephone Encounter (Signed)
I called pt and she said the CPAP is not working correctly. I called [facility] and they do not usually go to the home to service the machine but they will discuss it and let pt know if they can come to her house since she is homebound. Pt is aware to make sure she answers he phone.

## 2014-05-10 MED ORDER — DULOXETINE HCL 30 MG PO CPEP: 30.0000 mg | ORAL_CAPSULE | Freq: Every day | ORAL | Status: DC

## 2014-05-10 MED ORDER — VENLAFAXINE HCL ER 37.5 MG PO CP24: 37.5000 mg | ORAL_CAPSULE | Freq: Every day | ORAL | Status: DC

## 2014-05-10 NOTE — Telephone Encounter (Signed)
Decrease venlafaxine XR  / Effexor XR to 37.5mg  2 capsules daily for 7 days, then decrease to 1 capsule daily for 7 days, then stop.  Start cymbalta 30mg  tablet 1 tablet daily for 7 days then increase to 2 capsules daily. Please notify patient of changes - Rx's sent in to pharmacy.

## 2014-05-10 NOTE — Telephone Encounter (Signed)
Notified pt of recommendations She stated she has not been taking Venlafaxine Will start Cymbalta 30mg  x 1 wk, then increase to 2 daily

## 2014-05-14 ENCOUNTER — Telehealth: Payer: Self-pay | Admitting: Family Medicine

## 2014-05-18 ENCOUNTER — Other Ambulatory Visit: Payer: Self-pay | Admitting: Family Medicine

## 2014-05-18 NOTE — Telephone Encounter (Signed)
4 bottles given  Pt aware to have someone pick up

## 2014-05-22 NOTE — Telephone Encounter (Signed)
Pt wants a conserving device for her o2, order sent. Pt aware

## 2014-06-01 ENCOUNTER — Other Ambulatory Visit: Payer: Self-pay | Admitting: Family Medicine

## 2014-06-01 ENCOUNTER — Telehealth: Payer: Self-pay | Admitting: Family Medicine

## 2014-06-01 MED ORDER — DULOXETINE HCL 30 MG PO CPEP: 30.0000 mg | ORAL_CAPSULE | Freq: Two times a day (BID) | ORAL | Status: DC

## 2014-06-01 MED ORDER — RIVAROXABAN 20 MG PO TABS: 20.0000 mg | ORAL_TABLET | Freq: Every day | ORAL | Status: DC

## 2014-06-01 NOTE — Telephone Encounter (Signed)
done

## 2014-06-01 NOTE — Telephone Encounter (Signed)
Patient aware samples up front to be picked up.  

## 2014-06-06 ENCOUNTER — Telehealth: Payer: Self-pay | Admitting: Family Medicine

## 2014-06-06 NOTE — Telephone Encounter (Signed)
Pt aware that it is to be taken BID

## 2014-06-07 ENCOUNTER — Other Ambulatory Visit: Payer: Self-pay | Admitting: Family Medicine

## 2014-06-14 ENCOUNTER — Other Ambulatory Visit: Payer: Self-pay | Admitting: Family Medicine

## 2014-06-22 ENCOUNTER — Telehealth: Payer: Self-pay | Admitting: Family Medicine

## 2014-06-22 NOTE — Telephone Encounter (Signed)
After attempting her son and will all of her confusion  I called EMS to come out and evaluate the pt

## 2014-06-23 ENCOUNTER — Other Ambulatory Visit: Payer: Self-pay | Admitting: Family Medicine

## 2014-06-23 ENCOUNTER — Other Ambulatory Visit: Payer: Self-pay | Admitting: Nurse Practitioner

## 2014-06-23 NOTE — Telephone Encounter (Signed)
Clonazepam 1 mg TID prn anxiety, [facility]  Busy at patient's number.

## 2014-06-25 NOTE — Telephone Encounter (Signed)
Last filled 05/01/14. [facility]

## 2014-07-13 DIAGNOSIS — M129 Arthropathy, unspecified: Secondary | ICD-10-CM | POA: Diagnosis not present

## 2014-07-13 DIAGNOSIS — R5381 Other malaise: Secondary | ICD-10-CM | POA: Diagnosis not present

## 2014-07-13 DIAGNOSIS — F0390 Unspecified dementia without behavioral disturbance: Secondary | ICD-10-CM | POA: Diagnosis not present

## 2014-07-13 DIAGNOSIS — E039 Hypothyroidism, unspecified: Secondary | ICD-10-CM | POA: Diagnosis not present

## 2014-07-13 DIAGNOSIS — I4891 Unspecified atrial fibrillation: Secondary | ICD-10-CM | POA: Diagnosis not present

## 2014-07-13 DIAGNOSIS — F489 Nonpsychotic mental disorder, unspecified: Secondary | ICD-10-CM | POA: Diagnosis not present

## 2014-07-13 DIAGNOSIS — F411 Generalized anxiety disorder: Secondary | ICD-10-CM | POA: Diagnosis not present

## 2014-07-13 DIAGNOSIS — I509 Heart failure, unspecified: Secondary | ICD-10-CM | POA: Diagnosis not present

## 2014-07-13 DIAGNOSIS — F329 Major depressive disorder, single episode, unspecified: Secondary | ICD-10-CM | POA: Diagnosis not present

## 2014-07-13 DIAGNOSIS — F0150 Vascular dementia without behavioral disturbance: Secondary | ICD-10-CM | POA: Diagnosis not present

## 2014-07-13 DIAGNOSIS — M6281 Muscle weakness (generalized): Secondary | ICD-10-CM | POA: Diagnosis not present

## 2014-07-13 DIAGNOSIS — E119 Type 2 diabetes mellitus without complications: Secondary | ICD-10-CM | POA: Diagnosis not present

## 2014-07-13 DIAGNOSIS — R41841 Cognitive communication deficit: Secondary | ICD-10-CM | POA: Diagnosis not present

## 2014-07-13 DIAGNOSIS — F419 Anxiety disorder, unspecified: Secondary | ICD-10-CM | POA: Diagnosis not present

## 2014-07-13 DIAGNOSIS — R488 Other symbolic dysfunctions: Secondary | ICD-10-CM | POA: Diagnosis not present

## 2014-07-13 DIAGNOSIS — I87329 Chronic venous hypertension (idiopathic) with inflammation of unspecified lower extremity: Secondary | ICD-10-CM | POA: Diagnosis not present

## 2014-07-13 DIAGNOSIS — E785 Hyperlipidemia, unspecified: Secondary | ICD-10-CM | POA: Diagnosis not present

## 2014-07-13 DIAGNOSIS — E559 Vitamin D deficiency, unspecified: Secondary | ICD-10-CM | POA: Diagnosis not present

## 2014-07-17 ENCOUNTER — Ambulatory Visit: Payer: Medicare Other | Admitting: Family Medicine

## 2014-07-18 ENCOUNTER — Institutional Professional Consult (permissible substitution): Payer: Medicare Other | Admitting: Cardiology

## 2014-07-20 DIAGNOSIS — I4891 Unspecified atrial fibrillation: Secondary | ICD-10-CM | POA: Diagnosis not present

## 2014-07-20 DIAGNOSIS — F0150 Vascular dementia without behavioral disturbance: Secondary | ICD-10-CM | POA: Diagnosis not present

## 2014-07-20 DIAGNOSIS — R5381 Other malaise: Secondary | ICD-10-CM | POA: Diagnosis not present

## 2014-07-24 ENCOUNTER — Ambulatory Visit: Payer: Self-pay | Admitting: Family Medicine

## 2014-07-25 ENCOUNTER — Institutional Professional Consult (permissible substitution): Payer: Medicare Other | Admitting: Cardiology

## 2014-07-27 DIAGNOSIS — R41841 Cognitive communication deficit: Secondary | ICD-10-CM | POA: Diagnosis not present

## 2014-07-27 DIAGNOSIS — R488 Other symbolic dysfunctions: Secondary | ICD-10-CM | POA: Diagnosis not present

## 2014-07-27 DIAGNOSIS — F411 Generalized anxiety disorder: Secondary | ICD-10-CM | POA: Diagnosis not present

## 2014-07-30 ENCOUNTER — Other Ambulatory Visit: Payer: Self-pay | Admitting: Family Medicine

## 2014-08-01 ENCOUNTER — Ambulatory Visit (INDEPENDENT_AMBULATORY_CARE_PROVIDER_SITE_OTHER): Payer: Medicare Other | Admitting: Family Medicine

## 2014-08-01 ENCOUNTER — Encounter: Payer: Self-pay | Admitting: Family Medicine

## 2014-08-01 VITALS — BP 134/64 | HR 64 | Temp 97.3°F | Ht 64.0 in

## 2014-08-01 DIAGNOSIS — I480 Paroxysmal atrial fibrillation: Secondary | ICD-10-CM

## 2014-08-01 DIAGNOSIS — E785 Hyperlipidemia, unspecified: Secondary | ICD-10-CM | POA: Diagnosis not present

## 2014-08-01 DIAGNOSIS — I10 Essential (primary) hypertension: Secondary | ICD-10-CM | POA: Diagnosis not present

## 2014-08-01 DIAGNOSIS — F411 Generalized anxiety disorder: Secondary | ICD-10-CM

## 2014-08-01 DIAGNOSIS — E119 Type 2 diabetes mellitus without complications: Secondary | ICD-10-CM | POA: Diagnosis not present

## 2014-08-01 DIAGNOSIS — M549 Dorsalgia, unspecified: Secondary | ICD-10-CM

## 2014-08-01 DIAGNOSIS — E559 Vitamin D deficiency, unspecified: Secondary | ICD-10-CM

## 2014-08-01 DIAGNOSIS — R5383 Other fatigue: Secondary | ICD-10-CM | POA: Diagnosis not present

## 2014-08-01 DIAGNOSIS — R531 Weakness: Secondary | ICD-10-CM

## 2014-08-01 DIAGNOSIS — I4891 Unspecified atrial fibrillation: Secondary | ICD-10-CM | POA: Diagnosis not present

## 2014-08-01 DIAGNOSIS — G8929 Other chronic pain: Secondary | ICD-10-CM

## 2014-08-01 DIAGNOSIS — Z79899 Other long term (current) drug therapy: Secondary | ICD-10-CM

## 2014-08-01 LAB — POCT GLYCOSYLATED HEMOGLOBIN (HGB A1C): Hemoglobin A1C: 8.1

## 2014-08-01 MED ORDER — RIVAROXABAN 20 MG PO TABS: 20.0000 mg | ORAL_TABLET | Freq: Every day | ORAL | Status: DC

## 2014-08-01 NOTE — Patient Instructions (Addendum)
Follow up with [name] as planned - she needs to meet with you and review all meds and potential changes to diabetic meds. We will also try to talk to try at healthcare and get them involved with our clinical pharmacists and a provider in this office to get you on some kind of regular medication administration and insulin administration The other goal is to get you on less narcotics we can only do this gradually. To start with reduced your pain medicine to 1 whole one in the morning one half at lunch one half at supper and a whole one at bedtime Over the next 3 months we will try to get this down to at least one half or 14 times a day at the most. You should continue to follow your recommendations from the clinical pharmacist regarding her sliding scale insulin and this should not be done in the middle of the night it should be done at breakfast lunch and supper and bedtime When you come in for your visit to see her bring blood sugar readings with you at that time so that she can better adjust your insulin if necessary Try to sleep all night, did not get up in the middle of the night and take insulin. Do not get up in the middle the night and take narcotics. Try to eat regularly and drink plenty of fluids------ you can do this if you try Do not take any anti-inflammatory medication like ibuprofen or Aleve

## 2014-08-01 NOTE — Telephone Encounter (Signed)
Patient will pick them up when she comes to her appointment today.

## 2014-08-01 NOTE — Progress Notes (Addendum)
Subjective:    Patient ID: [name], female    DOB: 1941/01/18, 74 y.o.   MRN: [id number]  HPI Patient here today for follow up from a hospital visit for confusion. She was admitted to [facility]. When discharged from there, she was placed a [facility] to regain strength and for patient teaching. She is accompanied today by her son, Clint.         Patient Active Problem List   Diagnosis Date Noted  . Memory impairment 10/10/2013  . High risk medication use 10/10/2013  . Type 2 diabetes mellitus 01/26/2013  . Hypothyroid 01/26/2013  . Hypertension 01/26/2013  . Hyperlipemia 01/26/2013  . Hypercapnia 01/08/2013  . CAP (community acquired pneumonia) 01/08/2013  . Chronic respiratory failure 01/08/2013  . A-fib 09/28/2012  . Congestive heart failure 07/22/2012  . Hypokalemia 07/22/2012  . GAD (generalized anxiety disorder) 07/22/2012  . Depression 07/22/2012  . Atrial fibrillation 07/22/2012  . Chronic low back pain 07/22/2012  . Diabetic neuropathy 07/22/2012  . Migraine 07/22/2012  . Dyspnea 11/26/2010  . Morbid obesity 06/04/2010  . SUPRAVENTRICULAR TACHYCARDIA 06/04/2010  . HYPOTHYROIDISM 04/22/2008  . DIABETES, TYPE 2 04/22/2008  . ALLERGIC RHINITIS 04/22/2008  . SLEEP APNEA 04/22/2008  . COUGH 04/22/2008  . RESTLESS LEGS SYNDROME 04/12/2008  . HYPERSOMNIA 04/12/2008   Outpatient Encounter Prescriptions as of 08/01/2014  Medication Sig  . azelastine (ASTELIN) 0.1 % nasal spray Place 1 spray into both nostrils 2 (two) times daily. Use in each nostril as directed  . clonazePAM (KLONOPIN) 1 MG tablet TAKE 1 TABLET 3 TIMES A DAY AS NEEDED  . DULoxetine (CYMBALTA) 30 MG capsule Take 1 capsule (30 mg total) by mouth 2 (two) times daily.  . fluticasone (FLONASE) 50 MCG/ACT nasal spray USE 2 SPRAYS INTO THE NOSE DAILY.  . furosemide (LASIX) 40 MG tablet TAKE 1 TABLET BY MOUTH EVERY DAY  . glucose blood (ONE TOUCH ULTRA TEST) test strip Test  4X a day and prn  Dx 250.02  . insulin aspart (NOVOLOG) 100 UNIT/ML injection Inject 4-16 Units into the skin 3 (three) times daily before meals. Use per sliding scale  . insulin detemir (LEVEMIR) 100 UNIT/ML injection Inject SQ 48 units QAM and 35 units QHS  . levothyroxine (SYNTHROID, LEVOTHROID) 75 MCG tablet TAKE 1 TABLET (75 MCG TOTAL) BY MOUTH DAILY.  Marland Kitchen oxyCODONE-acetaminophen (PERCOCET) 10-325 MG per tablet Take 1 tablet by mouth 4 (four) times daily as needed for pain.   . potassium chloride (MICRO-K) 10 MEQ CR capsule TAKE 1 CAPSULE (10 MEQ TOTAL) BY MOUTH DAILY.  . pravastatin (PRAVACHOL) 80 MG tablet TAKE 1 TABLET (80 MG TOTAL) BY MOUTH DAILY.  . rivaroxaban (XARELTO) 20 MG TABS tablet Take 1 tablet (20 mg total) by mouth daily with supper.  . sotalol (BETAPACE) 80 MG tablet TAKE 1 AND 1/2 TABLET TWICE A DAY BY MOUTH  . Vitamin D, Ergocalciferol, (DRISDOL) 50000 UNITS CAPS capsule Take 1 capsule (50,000 Units total) by mouth every 7 (seven) days.  . [DISCONTINUED] doxycycline (VIBRA-TABS) 100 MG tablet Take 1 tablet (100 mg total) by mouth 2 (two) times daily.  . [DISCONTINUED] nystatin (MYCOSTATIN/NYSTOP) 100000 UNIT/GM POWD Apply 60 g topically as directed.  . [DISCONTINUED] pravastatin (PRAVACHOL) 80 MG tablet Take 1 tablet (80 mg total) by mouth daily.  . [DISCONTINUED] sotalol (BETAPACE) 80 MG tablet TAKE 1 AND 1/2 TABLET TWICE A DAY BY MOUTH  . [DISCONTINUED] venlafaxine XR (EFFEXOR-XR) 37.5 MG 24 hr capsule Take  1 capsule (37.5 mg total) by mouth daily with breakfast. Take 2 capsules daily for 7 days, then decrease to 1 capsule daily for 7 days then stop.  Marland Kitchen NITROSTAT 0.4 MG SL tablet USE AS DIRECTED (Patient not taking: Reported on 08/01/2014)  Oxygen 2.5 liters - continuous O2.    Review of Systems  Constitutional: Negative.   HENT: Negative.   Eyes: Negative.   Respiratory: Negative.   Cardiovascular: Negative.   Gastrointestinal: Negative.   Endocrine: Negative.     Genitourinary: Negative.   Musculoskeletal: Negative.   Skin: Negative.   Allergic/Immunologic: Negative.   Neurological: Negative.   Hematological: Negative.   Psychiatric/Behavioral: Negative.        Dementia and depression       Objective:   Physical Exam  Constitutional: She is oriented to person, place, and time. She appears well-developed and well-nourished. No distress.  The patient comes in in a wheelchair and she is morbidly obese. She is unable to get on the exam table. Her son is with her. She is coherent and understands our communication fairly well. She still keeps indicating she has to get up at nighttime to take insulin and no one has ever instructed her to do that. I am not sure that her pain level is at the point of taking10/325of Norco 4 times a day and we will work  on reducing that.  HENT:  Head: Normocephalic and atraumatic.  Right Ear: External ear normal.  Left Ear: External ear normal.  Nose: Nose normal.  Mouth/Throat: Oropharynx is clear and moist. No oropharyngeal exudate.  Eyes: Conjunctivae and EOM are normal. Pupils are equal, round, and reactive to light. Right eye exhibits no discharge. Left eye exhibits no discharge. No scleral icterus.  Neck: Normal range of motion. Neck supple. No thyromegaly present.  There are no carotid bruits and there are no anterior cervical nodes  Cardiovascular: Normal rate and regular rhythm.  Exam reveals friction rub.   No murmur heard. Distal pulses were difficult to palpate. The heart is regular today and there is no atrial fibrillation. The rate is 72/m  Pulmonary/Chest: Effort normal and breath sounds normal. No respiratory distress. She has no wheezes. She has no rales. She exhibits no tenderness.  Abdominal: Soft. Bowel sounds are normal. She exhibits no mass. There is no tenderness. There is no rebound and no guarding.  The abdomen as indicated morbidly obese. There was no specific tenderness masses or organ  enlargement felt.  Musculoskeletal: She exhibits no edema or tenderness.  There is no edema in the legs. The patient is in a wheelchair and was unable to stand in the office.  Lymphadenopathy:    She has no cervical adenopathy.  Neurological: She is alert and oriented to person, place, and time.  Skin: Skin is warm and dry. No rash noted. No erythema. No pallor.  The skin is very dry on both lower extremities. The patient has attempted to trim her fungal nails and has blood on one of the toes. She was instructed to not do this. Someone professionally needs to trim her nails.  Psychiatric: She has a normal mood and affect. Her behavior is normal. Thought content normal.  The patient is somewhat depressed but less so than in the past and I question her judgment about trimming her nails.  Nursing note and vitals reviewed.  BP 134/64 mmHg  Pulse 64  Temp(Src) 97.3 F (36.3 C) (Oral)  Ht $R'5\' 4"'Fn$  (1.626 m)  Wt  Greater than 1 hour of time was spent with this patient and more than 50% of that time was spent with coordinating home health care follow-up with clinical pharmacy and making sure that the son understands the plan of action to get her taken care of by living by herself at home.      Assessment & Plan:  1. Paroxysmal atrial fibrillation -Continue with Xarelto - CBC With differential/Platelet  2. Essential hypertension -Continue with Lasix 40 mg daily along with potassium. - BMP8+EGFR - Hepatic function panel - CBC With differential/Platelet  3. Hyperlipemia -Continue with aggressive therapeutic lifestyle changes and pravachol  80 - Lipid panel  4. GAD (generalized anxiety disorder) -For now, continue with Cymbalta 30 twice daily and clonazepam 1 mg 3 times daily - Thyroid Panel With TSH - CBC With differential/Platelet  5. Type 2 diabetes mellitus without complication -Return to clinic to meet with the clinical pharmacists and bring home blood sugars for review at that  time -Continue with sliding scale insulin and bring that sliding scale with you at the time you meet with the clinical pharmacist - POCT glycosylated hemoglobin (Hb A1C) - CBC With differential/Platelet  6. Vitamin D deficiency -We will determine the vitamin D dosage once her lab work is returned - Vit D  25 hydroxy (rtn osteoporosis monitoring)  7. General weakness -Try to have regular eating habits and sleeping habits and take your medicine on a regular basis based on what we have told you today -We will ask that you reduce the pain medicine to a whole one in the morning, half tablet at lunch a half or one at supper and a whole one at bedtime - Vit D  25 hydroxy (rtn osteoporosis monitoring) - Thyroid Panel With TSH - CBC With differential/Platelet  8. Morbid obesity -Continue aggressive diet measures  9. Chronic back pain -Continue weight loss and try to reduce pain medicine as directed  No orders of the defined types were placed in this encounter.   Patient Instructions  Follow up with Tammy as planned - she needs to meet with you and review all meds and potential changes to diabetic meds. We will also try to talk to try at healthcare and get them involved with our clinical pharmacists and a provider in this office to get you on some kind of regular medication administration and insulin administration The other goal is to get you on less narcotics we can only do this gradually. To start with reduced your pain medicine to 1 whole one in the morning one half at lunch one half at supper and a whole one at bedtime Over the next 3 months we will try to get this down to at least one half or 14 times a day at the most. You should continue to follow your recommendations from the clinical pharmacist regarding her sliding scale insulin and this should not be done in the middle of the night it should be done at breakfast lunch and supper and bedtime When you come in for your visit to see her  bring blood sugar readings with you at that time so that she can better adjust your insulin if necessary Try to sleep all night, did not get up in the middle of the night and take insulin. Do not get up in the middle the night and take narcotics. Try to eat regularly and drink plenty of fluids------ you can do this if you try Do not take any anti-inflammatory medication like ibuprofen or  Aleve   Arrie Senate MD  Note patient is on continuous oxygen therapy day and night. She is currently using 2.5 liters of O2.

## 2014-08-02 LAB — CBC WITH DIFFERENTIAL
BASOS ABS: 0 10*3/uL (ref 0.0–0.2)
BASOS: 0 %
EOS ABS: 0.1 10*3/uL (ref 0.0–0.4)
Eos: 1 %
HCT: 40.6 % (ref 34.0–46.6)
Hemoglobin: 13.1 g/dL (ref 11.1–15.9)
Immature Grans (Abs): 0 10*3/uL (ref 0.0–0.1)
Immature Granulocytes: 0 %
Lymphocytes Absolute: 2.1 10*3/uL (ref 0.7–3.1)
Lymphs: 27 %
MCH: 31.2 pg (ref 26.6–33.0)
MCHC: 32.3 g/dL (ref 31.5–35.7)
MCV: 97 fL (ref 79–97)
MONOS ABS: 0.8 10*3/uL (ref 0.1–0.9)
Monocytes: 11 %
NEUTROS ABS: 4.7 10*3/uL (ref 1.4–7.0)
NEUTROS PCT: 61 %
Platelets: 194 10*3/uL (ref 150–379)
RBC: 4.2 x10E6/uL (ref 3.77–5.28)
RDW: 13.1 % (ref 12.3–15.4)
WBC: 7.7 10*3/uL (ref 3.4–10.8)

## 2014-08-02 LAB — LIPID PANEL
Chol/HDL Ratio: 4.5 ratio units — ABNORMAL HIGH (ref 0.0–4.4)
Cholesterol, Total: 145 mg/dL (ref 100–199)
HDL: 32 mg/dL — ABNORMAL LOW (ref >39)
LDL CALC: 85 mg/dL (ref 0–99)
Triglycerides: 138 mg/dL (ref 0–149)
VLDL Cholesterol Cal: 28 mg/dL (ref 5–40)

## 2014-08-02 LAB — BMP8+EGFR
BUN/Creatinine Ratio: 20 (ref 11–26)
BUN: 13 mg/dL (ref 8–27)
CALCIUM: 9.1 mg/dL (ref 8.7–10.3)
CO2: 29 mmol/L (ref 18–29)
Chloride: 101 mmol/L (ref 97–108)
Creatinine, Ser: 0.66 mg/dL (ref 0.57–1.00)
GFR calc non Af Amer: 89 mL/min/{1.73_m2} (ref >59)
GFR, EST AFRICAN AMERICAN: 103 mL/min/{1.73_m2} (ref >59)
GLUCOSE: 276 mg/dL — AB (ref 65–99)
Potassium: 5.1 mmol/L (ref 3.5–5.2)
Sodium: 144 mmol/L (ref 134–144)

## 2014-08-02 LAB — HEPATIC FUNCTION PANEL
ALBUMIN: 3.6 g/dL (ref 3.5–4.8)
ALT: 15 IU/L (ref 0–32)
AST: 17 IU/L (ref 0–40)
Alkaline Phosphatase: 105 IU/L (ref 39–117)
BILIRUBIN DIRECT: 0.16 mg/dL (ref 0.00–0.40)
BILIRUBIN TOTAL: 0.5 mg/dL (ref 0.0–1.2)
Total Protein: 6.2 g/dL (ref 6.0–8.5)

## 2014-08-02 LAB — THYROID PANEL WITH TSH
FREE THYROXINE INDEX: 2.5 (ref 1.2–4.9)
T3 UPTAKE RATIO: 27 % (ref 24–39)
T4 TOTAL: 9.2 ug/dL (ref 4.5–12.0)
TSH: 1.24 u[IU]/mL (ref 0.450–4.500)

## 2014-08-02 LAB — VITAMIN D 25 HYDROXY (VIT D DEFICIENCY, FRACTURES): Vit D, 25-Hydroxy: 42.1 ng/mL (ref 30.0–100.0)

## 2014-08-02 NOTE — Addendum Note (Signed)
Addended by: Magdalene RiverBULLINS, JAMIE H on: 08/02/2014 12:37 PM   Modules accepted: Orders

## 2014-08-06 DIAGNOSIS — G8929 Other chronic pain: Secondary | ICD-10-CM | POA: Diagnosis not present

## 2014-08-06 DIAGNOSIS — Z9981 Dependence on supplemental oxygen: Secondary | ICD-10-CM | POA: Diagnosis not present

## 2014-08-06 DIAGNOSIS — I480 Paroxysmal atrial fibrillation: Secondary | ICD-10-CM | POA: Diagnosis not present

## 2014-08-06 DIAGNOSIS — E1140 Type 2 diabetes mellitus with diabetic neuropathy, unspecified: Secondary | ICD-10-CM | POA: Diagnosis not present

## 2014-08-06 DIAGNOSIS — M549 Dorsalgia, unspecified: Secondary | ICD-10-CM | POA: Diagnosis not present

## 2014-08-06 DIAGNOSIS — Z794 Long term (current) use of insulin: Secondary | ICD-10-CM | POA: Diagnosis not present

## 2014-08-06 DIAGNOSIS — Z7901 Long term (current) use of anticoagulants: Secondary | ICD-10-CM | POA: Diagnosis not present

## 2014-08-06 DIAGNOSIS — I509 Heart failure, unspecified: Secondary | ICD-10-CM | POA: Diagnosis not present

## 2014-08-06 DIAGNOSIS — Z5181 Encounter for therapeutic drug level monitoring: Secondary | ICD-10-CM | POA: Diagnosis not present

## 2014-08-06 DIAGNOSIS — E785 Hyperlipidemia, unspecified: Secondary | ICD-10-CM | POA: Diagnosis not present

## 2014-08-06 DIAGNOSIS — I10 Essential (primary) hypertension: Secondary | ICD-10-CM | POA: Diagnosis not present

## 2014-08-07 ENCOUNTER — Other Ambulatory Visit: Payer: Self-pay | Admitting: Family Medicine

## 2014-08-07 DIAGNOSIS — G8929 Other chronic pain: Secondary | ICD-10-CM | POA: Diagnosis not present

## 2014-08-07 DIAGNOSIS — E1140 Type 2 diabetes mellitus with diabetic neuropathy, unspecified: Secondary | ICD-10-CM | POA: Diagnosis not present

## 2014-08-07 DIAGNOSIS — Z9981 Dependence on supplemental oxygen: Secondary | ICD-10-CM | POA: Diagnosis not present

## 2014-08-07 DIAGNOSIS — Z5181 Encounter for therapeutic drug level monitoring: Secondary | ICD-10-CM | POA: Diagnosis not present

## 2014-08-07 DIAGNOSIS — Z7901 Long term (current) use of anticoagulants: Secondary | ICD-10-CM | POA: Diagnosis not present

## 2014-08-07 DIAGNOSIS — Z794 Long term (current) use of insulin: Secondary | ICD-10-CM | POA: Diagnosis not present

## 2014-08-07 DIAGNOSIS — I509 Heart failure, unspecified: Secondary | ICD-10-CM | POA: Diagnosis not present

## 2014-08-07 DIAGNOSIS — M549 Dorsalgia, unspecified: Secondary | ICD-10-CM | POA: Diagnosis not present

## 2014-08-07 DIAGNOSIS — I10 Essential (primary) hypertension: Secondary | ICD-10-CM | POA: Diagnosis not present

## 2014-08-07 DIAGNOSIS — I480 Paroxysmal atrial fibrillation: Secondary | ICD-10-CM | POA: Diagnosis not present

## 2014-08-07 DIAGNOSIS — E785 Hyperlipidemia, unspecified: Secondary | ICD-10-CM | POA: Diagnosis not present

## 2014-08-08 ENCOUNTER — Ambulatory Visit (INDEPENDENT_AMBULATORY_CARE_PROVIDER_SITE_OTHER): Payer: Medicare Other | Admitting: Pharmacist

## 2014-08-08 DIAGNOSIS — F411 Generalized anxiety disorder: Secondary | ICD-10-CM

## 2014-08-08 DIAGNOSIS — E119 Type 2 diabetes mellitus without complications: Secondary | ICD-10-CM | POA: Diagnosis not present

## 2014-08-08 DIAGNOSIS — Z794 Long term (current) use of insulin: Secondary | ICD-10-CM

## 2014-08-08 MED ORDER — DULOXETINE HCL 30 MG PO CPEP: 30.0000 mg | ORAL_CAPSULE | Freq: Every evening | ORAL | Status: DC

## 2014-08-08 NOTE — Patient Instructions (Signed)
Restart cymbalta / duloxetine 30mg  1 capsule at bedtime - this could help with anxiousness and hopefully use less clonazepam.   Lisa AlconNancy Sylla (DOB: Aug 18, 1940) Blood Glucose / Sugar Goals Fasting (if you have not eaten in last 4 hours) = 80 to 120 If within 2 hours of the start of eating = less than 180 Too low = less than 80 Too high = over 250 - call office to have insulin adjusted Check Blood glucose before meal and use chart below for Novolog dosing.    Blood glucose / sugar reading If you are eating a meal  Less than 80 None  80 to 120 6 units  121 to 150 7 units  151 to 200 8 units  201 to 250  10 units  251 to 300 12 units  301 or over 14 units    Levemir inject 42 units each morning and 30 units each evening.

## 2014-08-08 NOTE — Progress Notes (Signed)
Patient ID: [name], female   DOB: May 18, 1941, 74 y.o.   MRN: [id number]   Subjective:    [name] is a 74 y.o. female who presents for follow-up of Type 2 diabetes mellitus.  [name] has just returned home from being in rehab center for 1 month.  She is accompanied by her sons and from time to time she gets a bit aggititated with them.  She does not want them to help with her medications because she is afraid they will stop giving her something she needs.  In particular she is concerned that she will no longer get her pain medication or clonazepam.  Her sons report times when she gets very nervous and that is when she gets frustrated and "cannot think clearly"  Patient previously took venlafaxine and cymbalta.  She states she does not ever want to take venlafaxine because it made her feel bad.    Current diabetes symptoms/problems include variable BG at home and have been unchanged. Symptoms have been present for 1 month.  Known diabetic complications: nephropathy and cardiovascular disease Cardiovascular risk factors: advanced age (older than 109 for men, 83 for women), diabetes mellitus, dyslipidemia, hypertension, obesity (BMI >= 30 kg/m2) and sedentary lifestyle Current diabetic medications include insulin injections: Levemir 42 units qama nd 30 units qpm;  Novolog per sliding scale prior to each meal..  Eye exam current (within one year): no  Current diet: very variable.  She reports eating a middle of the night snack of 1 chocolate chip cookie or graham cracker with peanut butter.  She is not eating this becuase BG is too low but because she gets hungry.  She denies taking any insulin during this time which I advised her she should not do becuase she is returning to sleep. Current exercise: physical therapy for 5 visits  Current monitoring regimen: home blood tests - three times daily Home blood sugar records: patient reports fasting readings in the 150's to 170's in  the morning.  Other readings from her notebook 145, 154, 113, 115, 198, 115, 160, 153, 178, 182, 192, 173, 208.  I checked her BG today in office and it was 264 - this was about 1 hour after eating a chicken sandwich and having water.   Any episodes of hypoglycemia? no  Is She on ACE inhibitor or angiotensin II receptor blocker?  No - she has intolerance to lisinopril  The following portions of the patient's history were reviewed and updated as appropriate: allergies, current medications, past family history, past medical history, past social history, past surgical history and problem list.   Objective:    There were no vitals taken for this visit.  Lab Review GLUCOSE (mg/dL)  Date Value  40/98/1191 276*  03/02/2014 269*  10/10/2013 204*   GLUCOSE, BLD (mg/dL)  Date Value  47/82/9562 170*  01/09/2013 197*  01/08/2013 186*   CO2 (mmol/L)  Date Value  08/01/2014 29  03/02/2014 27  10/10/2013 26   BUN (mg/dL)  Date Value  13/02/6577 13  03/02/2014 16  10/10/2013 17  01/26/2013 12  01/09/2013 19  01/08/2013 13   CREAT (mg/dL)  Date Value  46/96/2952 0.67   CREATININE, SER (mg/dL)  Date Value  84/13/2440 0.66  03/02/2014 0.57  10/10/2013 0.59   RBG = 264     Assessment:    Diabetes Mellitus type II, under inadequate control.    Plan:    1.  Rx changes:   Restart Cymbalta  1  capsule daily - maybe will  be able to decrease amount of clonazepam needed  Continue Levemir 42 units Qam and 30 units Qpm.   Blood glucose / sugar reading If you are eating a meal  Less than 80 None  80 to 120 6 units  121 to 150 7 units  151 to 200 8 units  201 to 250  10 units  251 to 300 12 units  301 or over 14 units   2.  Education: Reviewed 'ABCs' of diabetes management (respective goals in parentheses):  A1C (<7.5% given patient's current health and risk of hypoglycemia), blood pressure (<140/90), and cholesterol (LDL <100). 3.  Compliance at present is estimated to  be inadequate. Efforts to improve compliance (if necessary) will be directed at dietary modifications:  Increase non-starchy vegetables - carrots, green bean, squash, zucchini, tomatoes, onions, peppers, spinach and other green leafy vegetables, cabbage, lettuce, cucumbers, asparagus, okra (not fried), eggplant limit sugar and processed foods (cakes, cookies, ice cream, crackers and chips) Increase fresh fruit but limit serving sizes 1/2 cup or about the size of tennis or baseball limit red meat to no more than 1-2 times per week (serving size about the size of your palm)  Choose whole grains / lean proteins - whole wheat bread, quinoa, whole grain rice (1/2 cup), fish, chicken, turkey and reMalawigular blood sugar monitoring: tid prior to meal and as needed . 4. Follow up: 1 month    Henrene Pastorammy Jmarion Christiano, PharmD, CPP, CDE

## 2014-08-08 NOTE — Telephone Encounter (Signed)
Last Vit D 08/01/14 DW[facility]  Normal  42.1

## 2014-08-09 DIAGNOSIS — I509 Heart failure, unspecified: Secondary | ICD-10-CM | POA: Diagnosis not present

## 2014-08-09 DIAGNOSIS — E785 Hyperlipidemia, unspecified: Secondary | ICD-10-CM | POA: Diagnosis not present

## 2014-08-09 DIAGNOSIS — Z5181 Encounter for therapeutic drug level monitoring: Secondary | ICD-10-CM | POA: Diagnosis not present

## 2014-08-09 DIAGNOSIS — Z9981 Dependence on supplemental oxygen: Secondary | ICD-10-CM | POA: Diagnosis not present

## 2014-08-09 DIAGNOSIS — I10 Essential (primary) hypertension: Secondary | ICD-10-CM | POA: Diagnosis not present

## 2014-08-09 DIAGNOSIS — Z7901 Long term (current) use of anticoagulants: Secondary | ICD-10-CM | POA: Diagnosis not present

## 2014-08-09 DIAGNOSIS — M549 Dorsalgia, unspecified: Secondary | ICD-10-CM | POA: Diagnosis not present

## 2014-08-09 DIAGNOSIS — Z794 Long term (current) use of insulin: Secondary | ICD-10-CM | POA: Diagnosis not present

## 2014-08-09 DIAGNOSIS — I480 Paroxysmal atrial fibrillation: Secondary | ICD-10-CM | POA: Diagnosis not present

## 2014-08-09 DIAGNOSIS — G8929 Other chronic pain: Secondary | ICD-10-CM | POA: Diagnosis not present

## 2014-08-09 DIAGNOSIS — E1140 Type 2 diabetes mellitus with diabetic neuropathy, unspecified: Secondary | ICD-10-CM | POA: Diagnosis not present

## 2014-08-12 ENCOUNTER — Other Ambulatory Visit: Payer: Self-pay | Admitting: Family Medicine

## 2014-08-13 ENCOUNTER — Ambulatory Visit: Payer: Medicare Other | Admitting: Family Medicine

## 2014-08-13 DIAGNOSIS — I509 Heart failure, unspecified: Secondary | ICD-10-CM | POA: Diagnosis not present

## 2014-08-13 DIAGNOSIS — G8929 Other chronic pain: Secondary | ICD-10-CM | POA: Diagnosis not present

## 2014-08-13 DIAGNOSIS — Z7901 Long term (current) use of anticoagulants: Secondary | ICD-10-CM | POA: Diagnosis not present

## 2014-08-13 DIAGNOSIS — Z5181 Encounter for therapeutic drug level monitoring: Secondary | ICD-10-CM | POA: Diagnosis not present

## 2014-08-13 DIAGNOSIS — I480 Paroxysmal atrial fibrillation: Secondary | ICD-10-CM | POA: Diagnosis not present

## 2014-08-13 DIAGNOSIS — E1140 Type 2 diabetes mellitus with diabetic neuropathy, unspecified: Secondary | ICD-10-CM | POA: Diagnosis not present

## 2014-08-13 DIAGNOSIS — M549 Dorsalgia, unspecified: Secondary | ICD-10-CM | POA: Diagnosis not present

## 2014-08-13 DIAGNOSIS — Z794 Long term (current) use of insulin: Secondary | ICD-10-CM | POA: Diagnosis not present

## 2014-08-13 DIAGNOSIS — Z9981 Dependence on supplemental oxygen: Secondary | ICD-10-CM | POA: Diagnosis not present

## 2014-08-13 DIAGNOSIS — I10 Essential (primary) hypertension: Secondary | ICD-10-CM | POA: Diagnosis not present

## 2014-08-13 DIAGNOSIS — E785 Hyperlipidemia, unspecified: Secondary | ICD-10-CM | POA: Diagnosis not present

## 2014-08-14 DIAGNOSIS — M549 Dorsalgia, unspecified: Secondary | ICD-10-CM | POA: Diagnosis not present

## 2014-08-14 DIAGNOSIS — I480 Paroxysmal atrial fibrillation: Secondary | ICD-10-CM | POA: Diagnosis not present

## 2014-08-14 DIAGNOSIS — Z7901 Long term (current) use of anticoagulants: Secondary | ICD-10-CM | POA: Diagnosis not present

## 2014-08-14 DIAGNOSIS — Z5181 Encounter for therapeutic drug level monitoring: Secondary | ICD-10-CM | POA: Diagnosis not present

## 2014-08-14 DIAGNOSIS — Z794 Long term (current) use of insulin: Secondary | ICD-10-CM | POA: Diagnosis not present

## 2014-08-14 DIAGNOSIS — I10 Essential (primary) hypertension: Secondary | ICD-10-CM | POA: Diagnosis not present

## 2014-08-14 DIAGNOSIS — I509 Heart failure, unspecified: Secondary | ICD-10-CM | POA: Diagnosis not present

## 2014-08-14 DIAGNOSIS — E1140 Type 2 diabetes mellitus with diabetic neuropathy, unspecified: Secondary | ICD-10-CM | POA: Diagnosis not present

## 2014-08-14 DIAGNOSIS — Z9981 Dependence on supplemental oxygen: Secondary | ICD-10-CM | POA: Diagnosis not present

## 2014-08-14 DIAGNOSIS — G8929 Other chronic pain: Secondary | ICD-10-CM | POA: Diagnosis not present

## 2014-08-14 DIAGNOSIS — E785 Hyperlipidemia, unspecified: Secondary | ICD-10-CM | POA: Diagnosis not present

## 2014-08-16 DIAGNOSIS — Z9981 Dependence on supplemental oxygen: Secondary | ICD-10-CM | POA: Diagnosis not present

## 2014-08-16 DIAGNOSIS — G8929 Other chronic pain: Secondary | ICD-10-CM | POA: Diagnosis not present

## 2014-08-16 DIAGNOSIS — Z7901 Long term (current) use of anticoagulants: Secondary | ICD-10-CM | POA: Diagnosis not present

## 2014-08-16 DIAGNOSIS — E1140 Type 2 diabetes mellitus with diabetic neuropathy, unspecified: Secondary | ICD-10-CM | POA: Diagnosis not present

## 2014-08-16 DIAGNOSIS — M549 Dorsalgia, unspecified: Secondary | ICD-10-CM | POA: Diagnosis not present

## 2014-08-16 DIAGNOSIS — I10 Essential (primary) hypertension: Secondary | ICD-10-CM | POA: Diagnosis not present

## 2014-08-16 DIAGNOSIS — I509 Heart failure, unspecified: Secondary | ICD-10-CM | POA: Diagnosis not present

## 2014-08-16 DIAGNOSIS — I480 Paroxysmal atrial fibrillation: Secondary | ICD-10-CM | POA: Diagnosis not present

## 2014-08-16 DIAGNOSIS — Z794 Long term (current) use of insulin: Secondary | ICD-10-CM | POA: Diagnosis not present

## 2014-08-16 DIAGNOSIS — Z5181 Encounter for therapeutic drug level monitoring: Secondary | ICD-10-CM | POA: Diagnosis not present

## 2014-08-16 DIAGNOSIS — E785 Hyperlipidemia, unspecified: Secondary | ICD-10-CM | POA: Diagnosis not present

## 2014-08-17 DIAGNOSIS — Z794 Long term (current) use of insulin: Secondary | ICD-10-CM | POA: Diagnosis not present

## 2014-08-17 DIAGNOSIS — G8929 Other chronic pain: Secondary | ICD-10-CM | POA: Diagnosis not present

## 2014-08-17 DIAGNOSIS — I10 Essential (primary) hypertension: Secondary | ICD-10-CM | POA: Diagnosis not present

## 2014-08-17 DIAGNOSIS — Z5181 Encounter for therapeutic drug level monitoring: Secondary | ICD-10-CM | POA: Diagnosis not present

## 2014-08-17 DIAGNOSIS — Z7901 Long term (current) use of anticoagulants: Secondary | ICD-10-CM | POA: Diagnosis not present

## 2014-08-17 DIAGNOSIS — E1140 Type 2 diabetes mellitus with diabetic neuropathy, unspecified: Secondary | ICD-10-CM | POA: Diagnosis not present

## 2014-08-17 DIAGNOSIS — E785 Hyperlipidemia, unspecified: Secondary | ICD-10-CM | POA: Diagnosis not present

## 2014-08-17 DIAGNOSIS — I509 Heart failure, unspecified: Secondary | ICD-10-CM | POA: Diagnosis not present

## 2014-08-17 DIAGNOSIS — Z9981 Dependence on supplemental oxygen: Secondary | ICD-10-CM | POA: Diagnosis not present

## 2014-08-17 DIAGNOSIS — M549 Dorsalgia, unspecified: Secondary | ICD-10-CM | POA: Diagnosis not present

## 2014-08-17 DIAGNOSIS — I480 Paroxysmal atrial fibrillation: Secondary | ICD-10-CM | POA: Diagnosis not present

## 2014-08-22 ENCOUNTER — Telehealth: Payer: Self-pay | Admitting: Family Medicine

## 2014-08-22 DIAGNOSIS — Z794 Long term (current) use of insulin: Secondary | ICD-10-CM | POA: Diagnosis not present

## 2014-08-22 DIAGNOSIS — I509 Heart failure, unspecified: Secondary | ICD-10-CM | POA: Diagnosis not present

## 2014-08-22 DIAGNOSIS — I480 Paroxysmal atrial fibrillation: Secondary | ICD-10-CM | POA: Diagnosis not present

## 2014-08-22 DIAGNOSIS — Z9981 Dependence on supplemental oxygen: Secondary | ICD-10-CM | POA: Diagnosis not present

## 2014-08-22 DIAGNOSIS — E785 Hyperlipidemia, unspecified: Secondary | ICD-10-CM | POA: Diagnosis not present

## 2014-08-22 DIAGNOSIS — Z5181 Encounter for therapeutic drug level monitoring: Secondary | ICD-10-CM | POA: Diagnosis not present

## 2014-08-22 DIAGNOSIS — Z7901 Long term (current) use of anticoagulants: Secondary | ICD-10-CM | POA: Diagnosis not present

## 2014-08-22 DIAGNOSIS — I10 Essential (primary) hypertension: Secondary | ICD-10-CM | POA: Diagnosis not present

## 2014-08-22 DIAGNOSIS — E1140 Type 2 diabetes mellitus with diabetic neuropathy, unspecified: Secondary | ICD-10-CM | POA: Diagnosis not present

## 2014-08-22 DIAGNOSIS — G8929 Other chronic pain: Secondary | ICD-10-CM | POA: Diagnosis not present

## 2014-08-22 DIAGNOSIS — M549 Dorsalgia, unspecified: Secondary | ICD-10-CM | POA: Diagnosis not present

## 2014-08-23 MED ORDER — RIVAROXABAN 20 MG PO TABS: 20.0000 mg | ORAL_TABLET | Freq: Every day | ORAL | Status: DC

## 2014-08-23 NOTE — Telephone Encounter (Signed)
Samples available for pickup  Patient aware 

## 2014-08-24 ENCOUNTER — Other Ambulatory Visit: Payer: Self-pay | Admitting: Family Medicine

## 2014-08-27 ENCOUNTER — Other Ambulatory Visit: Payer: Self-pay | Admitting: Nurse Practitioner

## 2014-08-27 MED ORDER — RIVAROXABAN 20 MG PO TABS: 20.0000 mg | ORAL_TABLET | Freq: Every day | ORAL | Status: DC

## 2014-08-29 ENCOUNTER — Telehealth: Payer: Self-pay | Admitting: Family Medicine

## 2014-08-29 DIAGNOSIS — R300 Dysuria: Secondary | ICD-10-CM

## 2014-08-29 MED ORDER — FLUCONAZOLE 150 MG PO TABS: ORAL_TABLET | ORAL | Status: DC

## 2014-08-29 NOTE — Telephone Encounter (Signed)
Discussed with Dr Christell ConstantMoore. Will send Diflucan for yeast and continue to use Nystatin. She should send in a clean catch urine before treatment.  Patient aware. Orders placed.

## 2014-08-29 NOTE — Telephone Encounter (Signed)
Please advise 

## 2014-08-29 NOTE — Telephone Encounter (Signed)
Please see if the patient can get us a clean catch midstream urine specimen for urinalysis Also speak with her drugstore and call in a different antifungal powder for yeast infection beneath the breast

## 2014-08-30 ENCOUNTER — Telehealth: Payer: Self-pay | Admitting: Family Medicine

## 2014-08-30 ENCOUNTER — Telehealth: Payer: Self-pay | Admitting: Pharmacist

## 2014-08-30 ENCOUNTER — Other Ambulatory Visit: Payer: Self-pay | Admitting: Family Medicine

## 2014-08-30 ENCOUNTER — Other Ambulatory Visit (INDEPENDENT_AMBULATORY_CARE_PROVIDER_SITE_OTHER): Payer: Medicare Other

## 2014-08-30 DIAGNOSIS — R300 Dysuria: Secondary | ICD-10-CM | POA: Diagnosis not present

## 2014-08-30 LAB — POCT UA - MICROSCOPIC ONLY
CRYSTALS, UR, HPF, POC: NEGATIVE
Casts, Ur, LPF, POC: NEGATIVE
Mucus, UA: NEGATIVE
RBC, urine, microscopic: NEGATIVE
YEAST UA: NEGATIVE

## 2014-08-30 NOTE — Telephone Encounter (Signed)
Last seen 08/01/14 DWM  This is not on EPI C med list

## 2014-08-30 NOTE — Progress Notes (Signed)
Lab only 

## 2014-08-30 NOTE — Telephone Encounter (Signed)
Patient states that last night she ate potatoes and black beans.  Her BG this am was 218.  At lunch was 262.   Recommended she increase Levemir to 42 units each morning and 34 units each evening.

## 2014-08-30 NOTE — Telephone Encounter (Signed)
Pt aware we are waiting on urine culture to come back before rx'ing new med

## 2014-08-31 MED ORDER — CIPROFLOXACIN HCL 500 MG PO TABS: 500.0000 mg | ORAL_TABLET | Freq: Two times a day (BID) | ORAL | Status: DC

## 2014-08-31 NOTE — Addendum Note (Signed)
Addended by: Tamera PuntWRAY, WENDY S on: 08/31/2014 09:35 AM   Modules accepted: Orders

## 2014-09-01 DIAGNOSIS — G894 Chronic pain syndrome: Secondary | ICD-10-CM | POA: Diagnosis not present

## 2014-09-01 DIAGNOSIS — Z79891 Long term (current) use of opiate analgesic: Secondary | ICD-10-CM | POA: Diagnosis not present

## 2014-09-01 DIAGNOSIS — M5136 Other intervertebral disc degeneration, lumbar region: Secondary | ICD-10-CM | POA: Diagnosis not present

## 2014-09-01 LAB — URINE CULTURE

## 2014-09-03 ENCOUNTER — Telehealth: Payer: Self-pay | Admitting: Pharmacist

## 2014-09-03 NOTE — Telephone Encounter (Signed)
Tried to call number busy 

## 2014-09-06 ENCOUNTER — Other Ambulatory Visit: Payer: Self-pay | Admitting: Family Medicine

## 2014-09-06 DIAGNOSIS — I4891 Unspecified atrial fibrillation: Secondary | ICD-10-CM | POA: Diagnosis not present

## 2014-09-06 DIAGNOSIS — J9612 Chronic respiratory failure with hypercapnia: Secondary | ICD-10-CM | POA: Diagnosis not present

## 2014-09-09 ENCOUNTER — Other Ambulatory Visit: Payer: Self-pay | Admitting: Family Medicine

## 2014-09-10 DIAGNOSIS — E1140 Type 2 diabetes mellitus with diabetic neuropathy, unspecified: Secondary | ICD-10-CM | POA: Diagnosis not present

## 2014-09-10 DIAGNOSIS — I10 Essential (primary) hypertension: Secondary | ICD-10-CM | POA: Diagnosis not present

## 2014-09-10 DIAGNOSIS — I509 Heart failure, unspecified: Secondary | ICD-10-CM | POA: Diagnosis not present

## 2014-09-10 DIAGNOSIS — I480 Paroxysmal atrial fibrillation: Secondary | ICD-10-CM | POA: Diagnosis not present

## 2014-09-11 DIAGNOSIS — E1165 Type 2 diabetes mellitus with hyperglycemia: Secondary | ICD-10-CM | POA: Diagnosis not present

## 2014-09-11 DIAGNOSIS — L84 Corns and callosities: Secondary | ICD-10-CM | POA: Diagnosis not present

## 2014-09-12 NOTE — Telephone Encounter (Signed)
Multiple attempts to call patient

## 2014-09-20 ENCOUNTER — Telehealth: Payer: Self-pay | Admitting: Family Medicine

## 2014-09-20 MED ORDER — RIVAROXABAN 20 MG PO TABS: 20.0000 mg | ORAL_TABLET | Freq: Every day | ORAL | Status: DC

## 2014-09-20 NOTE — Telephone Encounter (Signed)
done

## 2014-09-21 ENCOUNTER — Telehealth: Payer: Self-pay | Admitting: Family Medicine

## 2014-09-21 MED ORDER — RIVAROXABAN 20 MG PO TABS: 20.0000 mg | ORAL_TABLET | Freq: Every day | ORAL | Status: DC

## 2014-09-21 NOTE — Telephone Encounter (Signed)
Patient states that the xarelto prescription is $400 and she need a prior authorization. i advised patient that i will see if we have any samples that we can give her until the authorization can be completed. Samples left up front for patient

## 2014-09-24 ENCOUNTER — Other Ambulatory Visit: Payer: Self-pay | Admitting: Family Medicine

## 2014-09-26 ENCOUNTER — Encounter: Payer: Self-pay | Admitting: Cardiology

## 2014-09-26 ENCOUNTER — Ambulatory Visit (INDEPENDENT_AMBULATORY_CARE_PROVIDER_SITE_OTHER): Payer: Medicare Other | Admitting: Cardiology

## 2014-09-26 ENCOUNTER — Other Ambulatory Visit (INDEPENDENT_AMBULATORY_CARE_PROVIDER_SITE_OTHER): Payer: Medicare Other

## 2014-09-26 VITALS — BP 122/66 | HR 62 | Ht 64.0 in | Wt 283.0 lb

## 2014-09-26 DIAGNOSIS — N390 Urinary tract infection, site not specified: Secondary | ICD-10-CM | POA: Diagnosis not present

## 2014-09-26 DIAGNOSIS — I10 Essential (primary) hypertension: Secondary | ICD-10-CM | POA: Diagnosis not present

## 2014-09-26 LAB — POCT URINALYSIS DIPSTICK
BILIRUBIN UA: NEGATIVE
Glucose, UA: NEGATIVE
Ketones, UA: NEGATIVE
Leukocytes, UA: NEGATIVE
Nitrite, UA: NEGATIVE
PH UA: 5
Protein, UA: NEGATIVE
RBC UA: NEGATIVE
SPEC GRAV UA: 1.025
Urobilinogen, UA: NEGATIVE

## 2014-09-26 LAB — POCT UA - MICROSCOPIC ONLY
CASTS, UR, LPF, POC: NEGATIVE
Crystals, Ur, HPF, POC: NEGATIVE
Mucus, UA: NEGATIVE
RBC, urine, microscopic: NEGATIVE
YEAST UA: NEGATIVE

## 2014-09-26 NOTE — Patient Instructions (Signed)
The current medical regimen is effective;  continue present plan and medications.  Follow up in 1 year with Dr. Hochrein.  You will receive a letter in the mail 2 months before you are due.  Please call us when you receive this letter to schedule your follow up appointment.  Thank you for choosing [facility]!!     

## 2014-09-26 NOTE — Progress Notes (Signed)
Cardiology Office Note   Date:  09/26/2014   ID:  [name], DOB Sep 26, 1940, MRN [id number]  PCP:  Rudi HeapMOORE, DONALD, MD  Cardiologist:   Rollene RotundaJames Taysha Majewski, MD   Chief Complaint  Patient presents with  . SVT      History of Present Illness: [name] is a 74 y.o. female who presents for atrial fibrillation.  I had seen her greater than 3 years ago for evaluation of SVT. Since that time she has been [facility] and [facility] [address]  I reviewed these notes from both centers. I do see that since I saw her she was [facility]. She has also had diastolic dysfunction. She is reestablishing her care here. She is morbidly obese and gets around in a wheelchair predominantly though she can transfer from chair to bed. She has chronic oxygen. She has sleep apnea she doesn't use it. She is on chronic anticoagulation. She does not feel any palpitations, presyncope or syncope. She does not have any acute shortness of breath though she's chronically dyspneic. She does have any PND or orthopnea. She has no edema.    Past Medical History  Diagnosis Date  . Sleep apnea   . Morbid obesity   . Allergic rhinitis   . Diabetes mellitus     type 2  . Back pain   . Hypothyroidism   . CAD (coronary artery disease)   . Hyperlipemia   . Fibromyalgia   . Narcolepsy   . SVT (supraventricular tachycardia)     afib  . 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 Total abdominal hysterectomy    . Tubal ligation    . Appendectomy       Current Outpatient Prescriptions  Medication Sig Dispense Refill  . clonazePAM (KLONOPIN) 1 MG tablet TAKE 1 TABLET 3 TIMES A DAY AS NEEDED 90 tablet 1  . DULoxetine (CYMBALTA) 30 MG capsule Take 1 capsule (30 mg total) by mouth every evening. 90 capsule 0  . fluticasone (FLONASE) 50 MCG/ACT nasal spray USE 2 SPRAYS INTO THE NOSE DAILY. 16 g 2  . insulin aspart (NOVOLOG) 100  UNIT/ML injection Inject 4-16 Units into the skin 3 (three) times daily before meals. Use per sliding scale 1 vial 0  . insulin detemir (LEVEMIR) 100 UNIT/ML injection Inject SQ 42 units QAM and 34 units QHS 30 mL 1  . levothyroxine (SYNTHROID, LEVOTHROID) 75 MCG tablet TAKE 1 TABLET (75 MCG TOTAL) BY MOUTH DAILY. 90 tablet 2  . NITROSTAT 0.4 MG SL tablet USE AS DIRECTED 25 tablet 0  . nystatin (MYCOSTATIN/NYSTOP) 100000 UNIT/GM POWD USE AS DIRECTED 60 g 2  . oxyCODONE-acetaminophen (PERCOCET) 10-325 MG per tablet Take 1 tablet by mouth 4 (four) times daily as needed for pain.     . potassium chloride (MICRO-K) 10 MEQ CR capsule TAKE 1 CAPSULE (10 MEQ TOTAL) BY MOUTH DAILY. 30 capsule 5  . pravastatin (PRAVACHOL) 80 MG tablet TAKE 1 TABLET (80 MG TOTAL) BY MOUTH DAILY. 90 tablet 1  . rivaroxaban (XARELTO) 20 MG TABS tablet Take 1 tablet (20 mg total) by mouth daily with supper. 35 tablet 0  . sotalol (BETAPACE) 80 MG tablet TAKE 1 AND 1/2 TABLET TWICE A DAY BY MOUTH 135 tablet 0  . Vitamin D, Ergocalciferol, (DRISDOL) 50000 UNITS CAPS capsule TAKE 1 CAPSULE (50,000 UNITS TOTAL) BY MOUTH EVERY 7 (SEVEN) DAYS. 12  capsule 0  . furosemide (LASIX) 40 MG tablet TAKE 1 TABLET BY MOUTH EVERY DAY (Patient not taking: Reported on 09/26/2014) 30 tablet 5  . glucose blood (ONE TOUCH ULTRA TEST) test strip Test 4X a day and prn  Dx 250.02 100 each 12   No current facility-administered medications for this visit.    Allergies:   Amitriptyline; Codeine; Crestor; Detrol; Lipitor; Lisinopril; Penicillins; Seroquel; and Adhesive     Social History:  The patient  reports that she quit smoking about 46 years ago. She does not have any smokeless tobacco history on file. She reports that she does not drink alcohol or use illicit drugs.   Family History:  The patient's family history includes Heart attack in her father.    ROS:  Please see the history of present illness.   Otherwise, review of systems are positive  for none.   All other systems are reviewed and negative.    PHYSICAL EXAM: VS:  BP 122/66 mmHg  Pulse 62  Ht  (1.626 m)  Wt 283 lb (128.368 kg)  BMI 48.55 kg/m2 , BMI Body mass index is 48.55 kg/(m^2). PHYSICAL EXAM GEN:  No distress NECK:  No jugular venous distention at 90 degrees, waveform within normal limits, carotid upstroke brisk and symmetric, no bruits, no thyromegaly LYMPHATICS:  No cervical adenopathy LUNGS:  Clear to auscultation bilaterally BACK:  No CVA tenderness CHEST:  Unremarkable HEART:  S1 and S2 within normal limits, no S3, no S4, no clicks, no rubs, no murmurs ABD:  Positive bowel sounds normal in frequency in pitch, no bruits, no rebound, no guarding, unable to assess midline mass or bruit with the patient seated. EXT:  2 plus pulses throughout, no edema, no cyanosis no clubbing SKIN:  No rashes no nodules NEURO:  Cranial nerves II through XII grossly intact, motor grossly intact throughout PSYCH:  Cognitively intact, oriented to person place and time     EKG:  EKG is ordered today. The ekg ordered today demonstrates Sinus rhythm, rate 62, axis within normal limits, intervals within normal limits, no acute ST-T wave changes.   Recent Labs: 08/01/2014: ALT 15; BUN 13; Creatinine 0.66; Hemoglobin 13.1; Platelets 194; Potassium 5.1; Sodium 144; TSH 1.240     Wt Readings from Last 3 Encounters:  09/26/14 283 lb (128.368 kg)  04/03/14 302 lb (136.986 kg)  03/02/14 304 lb (137.893 kg)      Other studies Reviewed: Additional studies/ records that were reviewed today include: Extensive outside records from [facility] and [address]. Review of the above records demonstrates:  Please see elsewhere in the note.     ASSESSMENT AND PLAN:  ATRIAL FIB:  She hasn't had any symptomatic paroxysms recently. She seems to tolerate anticoagulation. No change in therapy is indicated.  DIASTOLIC DYSFUNCTION:  She surprisingly lives by herself and does some of her  cooking. We discussed salt and fluid restriction. She actually doesn't seem to be volume overloaded. At this point no change in therapy is indicated.  I did review her recent labs as above.   Current medicines are reviewed at length with the patient today.  The patient does not have concerns regarding medicines.  The following changes have been made:  no change  Labs/ tests ordered today include: none  No orders of the defined types were placed in this encounter.     Disposition:   FU with six months with me.     Signed, Rollene Rotunda, MD  09/26/2014 4:47 PM  [facility]

## 2014-09-27 ENCOUNTER — Telehealth: Payer: Self-pay

## 2014-09-27 NOTE — Telephone Encounter (Signed)
[facility] approved Xarelto Reference # [id number]  Approved through 09/27/15

## 2014-09-27 NOTE — Addendum Note (Signed)
Addended by: Georgann HousekeeperBOLES, Adisa Vigeant R on: 09/27/2014 10:59 AM   Modules accepted: Medications

## 2014-09-28 LAB — URINE CULTURE

## 2014-10-03 ENCOUNTER — Other Ambulatory Visit: Payer: Self-pay | Admitting: Nurse Practitioner

## 2014-10-03 NOTE — Telephone Encounter (Signed)
Please call in clonazepam with 1 refills 

## 2014-10-03 NOTE — Telephone Encounter (Signed)
Dr Buel ReamMoores pt, last seen 08/01/14, last filled 08/14/14. [facility]

## 2014-10-04 ENCOUNTER — Other Ambulatory Visit: Payer: Self-pay | Admitting: Nurse Practitioner

## 2014-10-04 ENCOUNTER — Telehealth: Payer: Self-pay | Admitting: *Deleted

## 2014-10-04 NOTE — Telephone Encounter (Signed)
-----   Message from Ernestina Pennaonald W Moore, MD sent at 09/29/2014 12:05 PM EDT ----- The urine culture grew out bacteria that was sensitive to Cipro. Please start cipro, 100 mg twice daily for 7 days and recheck urinalysis after the antibiotic is completed. According to the current record she is not taking any antibiotics at this time please confirm that with the patient

## 2014-10-04 NOTE — Telephone Encounter (Signed)
rx called into pharmacy

## 2014-10-04 NOTE — Telephone Encounter (Signed)
No answer.  [facility] needs to [facility] for [facility]TI.

## 2014-10-05 DIAGNOSIS — J9612 Chronic respiratory failure with hypercapnia: Secondary | ICD-10-CM | POA: Diagnosis not present

## 2014-10-05 DIAGNOSIS — I4891 Unspecified atrial fibrillation: Secondary | ICD-10-CM | POA: Diagnosis not present

## 2014-10-16 NOTE — Progress Notes (Signed)
NA to see if new Rx had been picked up and taken

## 2014-10-24 DIAGNOSIS — G894 Chronic pain syndrome: Secondary | ICD-10-CM | POA: Diagnosis not present

## 2014-10-24 DIAGNOSIS — M5134 Other intervertebral disc degeneration, thoracic region: Secondary | ICD-10-CM | POA: Diagnosis not present

## 2014-10-24 DIAGNOSIS — Z79891 Long term (current) use of opiate analgesic: Secondary | ICD-10-CM | POA: Diagnosis not present

## 2014-10-31 ENCOUNTER — Other Ambulatory Visit: Payer: Self-pay | Admitting: Family Medicine

## 2014-11-01 NOTE — Telephone Encounter (Signed)
Last seen 08/01/14 last Vit D 1/20  42.1

## 2014-11-05 ENCOUNTER — Other Ambulatory Visit: Payer: Self-pay | Admitting: Family Medicine

## 2014-11-05 DIAGNOSIS — J9612 Chronic respiratory failure with hypercapnia: Secondary | ICD-10-CM | POA: Diagnosis not present

## 2014-11-05 DIAGNOSIS — I4891 Unspecified atrial fibrillation: Secondary | ICD-10-CM | POA: Diagnosis not present

## 2014-11-06 ENCOUNTER — Other Ambulatory Visit: Payer: Self-pay | Admitting: Family Medicine

## 2014-11-14 ENCOUNTER — Ambulatory Visit: Payer: Medicare Other | Admitting: Family Medicine

## 2014-11-19 ENCOUNTER — Other Ambulatory Visit: Payer: Self-pay | Admitting: Family Medicine

## 2014-11-20 ENCOUNTER — Other Ambulatory Visit: Payer: Self-pay

## 2014-11-20 MED ORDER — RIVAROXABAN 20 MG PO TABS: 20.0000 mg | ORAL_TABLET | Freq: Every day | ORAL | Status: DC

## 2014-11-21 DIAGNOSIS — M5134 Other intervertebral disc degeneration, thoracic region: Secondary | ICD-10-CM | POA: Diagnosis not present

## 2014-11-22 ENCOUNTER — Other Ambulatory Visit: Payer: Self-pay | Admitting: Family Medicine

## 2014-12-04 ENCOUNTER — Ambulatory Visit: Payer: Medicare Other | Admitting: Family Medicine

## 2014-12-05 DIAGNOSIS — J9612 Chronic respiratory failure with hypercapnia: Secondary | ICD-10-CM | POA: Diagnosis not present

## 2014-12-05 DIAGNOSIS — I4891 Unspecified atrial fibrillation: Secondary | ICD-10-CM | POA: Diagnosis not present

## 2014-12-14 ENCOUNTER — Other Ambulatory Visit: Payer: Self-pay | Admitting: Family Medicine

## 2014-12-17 ENCOUNTER — Other Ambulatory Visit: Payer: Self-pay | Admitting: Family Medicine

## 2014-12-18 ENCOUNTER — Other Ambulatory Visit: Payer: Self-pay | Admitting: Family Medicine

## 2014-12-19 NOTE — Telephone Encounter (Signed)
Last seen 08/01/14 [facility]

## 2014-12-24 ENCOUNTER — Other Ambulatory Visit: Payer: Self-pay | Admitting: Family Medicine

## 2014-12-28 ENCOUNTER — Other Ambulatory Visit: Payer: Self-pay | Admitting: Nurse Practitioner

## 2014-12-31 NOTE — Telephone Encounter (Signed)
Last seen 08/01/14  Dr Christell Constant   If approved route to nurse to [facility]

## 2015-01-01 ENCOUNTER — Encounter: Payer: Self-pay | Admitting: Family Medicine

## 2015-01-01 ENCOUNTER — Ambulatory Visit (INDEPENDENT_AMBULATORY_CARE_PROVIDER_SITE_OTHER): Payer: Medicare Other | Admitting: Family Medicine

## 2015-01-01 ENCOUNTER — Ambulatory Visit (INDEPENDENT_AMBULATORY_CARE_PROVIDER_SITE_OTHER): Payer: Medicare Other

## 2015-01-01 VITALS — BP 148/68 | HR 53 | Temp 97.7°F | Ht 64.0 in | Wt 272.0 lb

## 2015-01-01 DIAGNOSIS — F411 Generalized anxiety disorder: Secondary | ICD-10-CM

## 2015-01-01 DIAGNOSIS — I480 Paroxysmal atrial fibrillation: Secondary | ICD-10-CM

## 2015-01-01 DIAGNOSIS — Z794 Long term (current) use of insulin: Secondary | ICD-10-CM | POA: Diagnosis not present

## 2015-01-01 DIAGNOSIS — E559 Vitamin D deficiency, unspecified: Secondary | ICD-10-CM

## 2015-01-01 DIAGNOSIS — E119 Type 2 diabetes mellitus without complications: Secondary | ICD-10-CM | POA: Diagnosis not present

## 2015-01-01 DIAGNOSIS — E785 Hyperlipidemia, unspecified: Secondary | ICD-10-CM

## 2015-01-01 DIAGNOSIS — I10 Essential (primary) hypertension: Secondary | ICD-10-CM

## 2015-01-01 LAB — POCT CBC
GRANULOCYTE PERCENT: 61.4 % (ref 37–80)
HCT, POC: 38.7 % (ref 37.7–47.9)
Hemoglobin: 12.2 g/dL (ref 12.2–16.2)
Lymph, poc: 2.7 (ref 0.6–3.4)
MCH, POC: 29.4 pg (ref 27–31.2)
MCHC: 31.5 g/dL — AB (ref 31.8–35.4)
MCV: 93.2 fL (ref 80–97)
MPV: 7.8 fL (ref 0–99.8)
PLATELET COUNT, POC: 205 10*3/uL (ref 142–424)
POC Granulocyte: 5.1 (ref 2–6.9)
POC LYMPH PERCENT: 32.1 %L (ref 10–50)
RBC: 4.15 M/uL (ref 4.04–5.48)
RDW, POC: 13.6 %
WBC: 8.3 10*3/uL (ref 4.6–10.2)

## 2015-01-01 LAB — POCT GLYCOSYLATED HEMOGLOBIN (HGB A1C): HEMOGLOBIN A1C: 7.1

## 2015-01-01 MED ORDER — DULOXETINE HCL 30 MG PO CPEP: ORAL_CAPSULE | ORAL | Status: DC

## 2015-01-01 NOTE — Patient Instructions (Addendum)
Medicare Annual Wellness Visit  [facility] and the medical providers at [facility] strive to bring you the best medical care.  In doing so we not only want to address your current medical conditions and concerns but also to detect new conditions early and prevent illness, disease and health-related problems.    Medicare offers a yearly Wellness Visit which allows our clinical staff to assess your need for preventative services including immunizations, lifestyle education, counseling to decrease risk of preventable diseases and screening for fall risk and other medical concerns.    This visit is provided free of charge (no copay) for all Medicare recipients. The clinical pharmacists at [facility] have begun to conduct these Wellness Visits which will also include a thorough review of all your medications.    As you primary medical provider recommend that you make an appointment for your Annual Wellness Visit if you have not done so already this year.  You may set up this appointment before you leave today or you may call back ([phone]) and schedule an appointment.  Please make sure when you call that you mention that you are scheduling your Annual Wellness Visit with the clinical pharmacist so that the appointment may be made for the proper length of time.     Continue current medications. Continue good therapeutic lifestyle changes which include good diet and exercise. Fall precautions discussed with patient. If an FOBT was given today- please return it to our front desk. If you are over 10 years old - you may need Prevnar 13 or the adult Pneumonia vaccine.  Flu Shots are still available at our office. If you still haven't had one please call to set up a nurse visit to get one.   After your visit with [place] today you will receive a survey in the mail or online from [employer] regarding your care with [place]. Please take a moment to  fill this out. Your feedback is very important to [place] as you can help [place] better understand your patient needs as well as improve your experience and satisfaction. WE CARE ABOUT YOU!!!   The patient should stay as active as possible and should get out and be with people as much as possible Continue to check blood sugars regularly and bring these readings to the next visit Keep daily activities on a regular basis Drink plenty of water Follow-up with pain management as needed Please be thankful that you have a son who is taking good care of you Make appointment with podiatrist, Dr. Ulice Brilliant for follow-up of nail care Continue to use lotions on feet to keep the skin well hydrated

## 2015-01-01 NOTE — Progress Notes (Signed)
Subjective:    Patient ID: [name], female    DOB: Apr 18, 1941, 74 y.o.   MRN: [id number]  HPI Pt here for follow up and management of chronic medical problems which includes hypertension, hyperlipidemia, and diabetes. She is taking medications regularly. The patient comes in and a wheelchair but was able to stand to get her weight. She comes to the visit today with her son. She continues to have issues with her morbid obesity. She has a history of atrial fibrillation and takes one of the new or agents for this. Today she complains mostly of back pain. She also has diabetes mellitus.      Patient Active Problem List   Diagnosis Date Noted  . Memory impairment 10/10/2013  . High risk medication use 10/10/2013  . Type 2 diabetes mellitus 01/26/2013  . Hypothyroid 01/26/2013  . Hypertension 01/26/2013  . Hyperlipemia 01/26/2013  . Hypercapnia 01/08/2013  . CAP (community acquired pneumonia) 01/08/2013  . Chronic respiratory failure 01/08/2013  . A-fib 09/28/2012  . Congestive heart failure 07/22/2012  . Hypokalemia 07/22/2012  . GAD (generalized anxiety disorder) 07/22/2012  . Depression 07/22/2012  . Atrial fibrillation 07/22/2012  . Chronic low back pain 07/22/2012  . Diabetic neuropathy 07/22/2012  . Migraine 07/22/2012  . Dyspnea 11/26/2010  . Morbid obesity 06/04/2010  . SUPRAVENTRICULAR TACHYCARDIA 06/04/2010  . HYPOTHYROIDISM 04/22/2008  . DIABETES, TYPE 2 04/22/2008  . ALLERGIC RHINITIS 04/22/2008  . SLEEP APNEA 04/22/2008  . COUGH 04/22/2008  . RESTLESS LEGS SYNDROME 04/12/2008  . HYPERSOMNIA 04/12/2008   Outpatient Encounter Prescriptions as of 01/01/2015  Medication Sig  . clonazePAM (KLONOPIN) 1 MG tablet TAKE 1 TABLET BY MOUTH THREE TIMES A DAY AS NEEDED  . DULoxetine (CYMBALTA) 30 MG capsule TAKE 1 CAPSULE (30 MG TOTAL) BY MOUTH EVERY EVENING.  . fluticasone (FLONASE) 50 MCG/ACT nasal spray USE 2 SPRAYS INTO THE NOSE DAILY.  . furosemide (LASIX) 40  MG tablet TAKE 1 TABLET BY MOUTH EVERY DAY  . glucose blood (ONE TOUCH ULTRA TEST) test strip Test 4X a day and prn  Dx 250.02  . insulin aspart (NOVOLOG) 100 UNIT/ML injection Inject 4-16 Units into the skin 3 (three) times daily before meals. Use per sliding scale  . insulin detemir (LEVEMIR) 100 UNIT/ML injection Inject SQ 42 units QAM and 34 units QHS  . levothyroxine (SYNTHROID, LEVOTHROID) 75 MCG tablet TAKE 1 TABLET (75 MCG TOTAL) BY MOUTH DAILY.  Marland Kitchen NITROSTAT 0.4 MG SL tablet USE AS DIRECTED  . nystatin (MYCOSTATIN/NYSTOP) 100000 UNIT/GM POWD USE AS DIRECTED  . oxyCODONE-acetaminophen (PERCOCET) 10-325 MG per tablet Take 1 tablet by mouth 4 (four) times daily as needed for pain.   . potassium chloride (MICRO-K) 10 MEQ CR capsule TAKE 1 CAPSULE (10 MEQ TOTAL) BY MOUTH DAILY.  . pravastatin (PRAVACHOL) 80 MG tablet TAKE 1 TABLET (80 MG TOTAL) BY MOUTH DAILY.  . sotalol (BETAPACE) 80 MG tablet TAKE 1 AND 1/2 TABLET TWICE A DAY BY MOUTH  . Vitamin D, Ergocalciferol, (DRISDOL) 50000 UNITS CAPS capsule TAKE 1 CAPSULE (50,000 UNITS TOTAL) BY MOUTH EVERY 7 (SEVEN) DAYS.  Marland Kitchen XARELTO 20 MG TABS tablet TAKE 1 TABLET (20 MG TOTAL) BY MOUTH DAILY WITH SUPPER.  . [DISCONTINUED] rivaroxaban (XARELTO) 20 MG TABS tablet Take 1 tablet (20 mg total) by mouth daily with supper.   No facility-administered encounter medications on file as of 01/01/2015.      Review of Systems  Constitutional: Negative.   HENT: Negative.   Eyes:  Negative.   Respiratory: Negative.   Cardiovascular: Negative.   Gastrointestinal: Negative.   Endocrine: Negative.   Genitourinary: Negative.   Musculoskeletal: Positive for back pain.  Skin: Negative.   Allergic/Immunologic: Negative.   Neurological: Negative.   Hematological: Negative.   Psychiatric/Behavioral: Negative.        Objective:   Physical Exam  Constitutional: She is oriented to person, place, and time. She appears well-nourished. No distress.  The patient  comes to the visit today with her son and she seems to be in better spirits than usual.  HENT:  Head: Normocephalic and atraumatic.  Right Ear: External ear normal.  Left Ear: External ear normal.  Nose: Nose normal.  Mouth/Throat: Oropharynx is clear and moist.  Eyes: Conjunctivae and EOM are normal. Pupils are equal, round, and reactive to light. Right eye exhibits no discharge. Left eye exhibits no discharge. No scleral icterus.  Neck: Normal range of motion. Neck supple. No thyromegaly present.  Cardiovascular: Normal rate, regular rhythm, normal heart sounds and intact distal pulses.   No murmur heard. The heart rate was regular today at 60/m  Pulmonary/Chest: Effort normal and breath sounds normal. No respiratory distress. She has no wheezes. She has no rales. She exhibits no tenderness.  Clear anteriorly and posteriorly  Abdominal: Soft. Bowel sounds are normal. She exhibits no mass. There is no tenderness. There is no rebound and no guarding.  Morbidly obese and difficult to examine no specific tenderness.  Musculoskeletal: Normal range of motion. She exhibits no edema or tenderness.  Patient is in a wheelchair and was able to stand with assistance. She is able to stay at home by herself. She has a monitoring device on her wrist.  Lymphadenopathy:    She has no cervical adenopathy.  Neurological: She is alert and oriented to person, place, and time.  Skin: Skin is warm and dry.  Very dry skin in general is best on feet and nails are in need of care  Psychiatric: She has a normal mood and affect. Her behavior is normal. Judgment and thought content normal.  Nursing note and vitals reviewed.  BP 148/68 mmHg  Pulse 53  Temp(Src) 97.7 F (36.5 C) (Oral)  Ht _0  (1.626 m)  Wt 272 lb (123.378 kg)  BMI 46.67 kg/m2  Results for orders placed or performed in visit on 01/01/15  POCT CBC  Result Value Ref Range   WBC 8.3 4.6 - 10.2 K/uL   Lymph, poc 2.7 0.6 - 3.4   POC LYMPH  PERCENT 32.1 10 - 50 %L   POC Granulocyte 5.1 2 - 6.9   Granulocyte percent 61.4 37 - 80 %G   RBC 4.15 4.04 - 5.48 M/uL   Hemoglobin 12.2 12.2 - 16.2 g/dL   HCT, POC 38.7 37.7 - 47.9 %   MCV 93.2 80 - 97 fL   MCH, POC 29.4 27 - 31.2 pg   MCHC 31.5 (A) 31.8 - 35.4 g/dL   RDW, POC 13.6 %   Platelet Count, POC 205 142 - 424 K/uL   MPV 7.8 0 - 99.8 fL  POCT glycosylated hemoglobin (Hb A1C)  Result Value Ref Range   Hemoglobin A1C 7.1    The patient and her son were informed of these results before they left the office.       Assessment & Plan:  1. GAD (generalized anxiety disorder) -The patient seems to be in better spirits today and did smile occasionally during the visit. She is alert. She  should continue her current medications for anxiety and depression. - POCT CBC  2. Type 2 diabetes mellitus treated with insulin -SHe says that her blood sugars at home run between the 100s and 200s. Her A1c in the office today was 7.1% and this accident remains fairly good blood sugar control. She should continue with current treatment. - POCT CBC - BMP8+EGFR - POCT glycosylated hemoglobin (Hb A1C)  3. Paroxysmal atrial fibrillation -She continues to take Xarelto. Her rhythm was actually regular today at 60/m. - POCT CBC - DG Chest 2 View; Future  4. Essential hypertension -The systolic blood pressure was slightly elevated but there will be no change in treatment. - POCT CBC - BMP8+EGFR - Hepatic function panel - DG Chest 2 View; Future  5. Hyperlipemia -She should continue with pravastatin pending results of lab work - POCT CBC - Lipid panel - DG Chest 2 View; Future  6. Vitamin D deficiency -We will make any decisions about vitamin D replacement pending results of lab work - POCT CBC - Vit D  25 hydroxy (rtn osteoporosis monitoring)  7. Morbid obesity -She once again needs to be more active physically and around people that will help her be more positive emotionally and I  believe this would help her with her weight loss. She needs to continue to follow her diet as aggressively as possible as far as cholesterol and sugar.  Meds ordered this encounter  Medications  . DULoxetine (CYMBALTA) 30 MG capsule    Sig: TAKE 1 CAPSULE (30 MG TOTAL) BY MOUTH EVERY EVENING.    Dispense:  30 capsule    Refill:  11   Patient Instructions                       Medicare Annual Wellness Visit  [facility] and the medical providers at [facility] strive to bring you the best medical care.  In doing so we not only want to address your current medical conditions and concerns but also to detect new conditions early and prevent illness, disease and health-related problems.    Medicare offers a yearly Wellness Visit which allows our clinical staff to assess your need for preventative services including immunizations, lifestyle education, counseling to decrease risk of preventable diseases and screening for fall risk and other medical concerns.    This visit is provided free of charge (no copay) for all Medicare recipients. The clinical pharmacists at [facility] have begun to conduct these Wellness Visits which will also include a thorough review of all your medications.    As you primary medical provider recommend that you make an appointment for your Annual Wellness Visit if you have not done so already this year.  You may set up this appointment before you leave today or you may call back ([phone]) and schedule an appointment.  Please make sure when you call that you mention that you are scheduling your Annual Wellness Visit with the clinical pharmacist so that the appointment may be made for the proper length of time.     Continue current medications. Continue good therapeutic lifestyle changes which include good diet and exercise. Fall precautions discussed with patient. If an FOBT was given today- please return it to our front  desk. If you are over 25 years old - you may need Prevnar 75 or the adult Pneumonia vaccine.  Flu Shots are still available at our office. If you still haven't had one please call  to set up a nurse visit to get one.   After your visit with [place] today you will receive a survey in the mail or online from [employer] regarding your care with [place]. Please take a moment to fill this out. Your feedback is very important to [place] as you can help [place] better understand your patient needs as well as improve your experience and satisfaction. WE CARE ABOUT YOU!!!   The patient should stay as active as possible and should get out and be with people as much as possible Continue to check blood sugars regularly and bring these readings to the next visit Keep daily activities on a regular basis Drink plenty of water Follow-up with pain management as needed Please be thankful that you have a son who is taking good care of you Make appointment with podiatrist, Dr. Irving Shows for follow-up of nail care Continue to use lotions on feet to keep the skin well hydrated   Arrie Senate MD

## 2015-01-02 LAB — BMP8+EGFR
BUN / CREAT RATIO: 22 (ref 11–26)
BUN: 14 mg/dL (ref 8–27)
CO2: 30 mmol/L — AB (ref 18–29)
CREATININE: 0.65 mg/dL (ref 0.57–1.00)
Calcium: 9.2 mg/dL (ref 8.7–10.3)
Chloride: 100 mmol/L (ref 97–108)
GFR calc non Af Amer: 89 mL/min/{1.73_m2} (ref >59)
GFR, EST AFRICAN AMERICAN: 103 mL/min/{1.73_m2} (ref >59)
Glucose: 196 mg/dL — ABNORMAL HIGH (ref 65–99)
POTASSIUM: 4.6 mmol/L (ref 3.5–5.2)
SODIUM: 142 mmol/L (ref 134–144)

## 2015-01-02 LAB — HEPATIC FUNCTION PANEL
ALBUMIN: 3.2 g/dL — AB (ref 3.5–4.8)
ALT: 13 IU/L (ref 0–32)
AST: 18 IU/L (ref 0–40)
Alkaline Phosphatase: 96 IU/L (ref 39–117)
Bilirubin Total: 0.4 mg/dL (ref 0.0–1.2)
Bilirubin, Direct: 0.11 mg/dL (ref 0.00–0.40)
Total Protein: 6.2 g/dL (ref 6.0–8.5)

## 2015-01-02 LAB — LIPID PANEL
Chol/HDL Ratio: 4.9 ratio units — ABNORMAL HIGH (ref 0.0–4.4)
Cholesterol, Total: 143 mg/dL (ref 100–199)
HDL: 29 mg/dL — ABNORMAL LOW (ref >39)
LDL Calculated: 88 mg/dL (ref 0–99)
TRIGLYCERIDES: 130 mg/dL (ref 0–149)
VLDL CHOLESTEROL CAL: 26 mg/dL (ref 5–40)

## 2015-01-02 LAB — VITAMIN D 25 HYDROXY (VIT D DEFICIENCY, FRACTURES): VIT D 25 HYDROXY: 39.3 ng/mL (ref 30.0–100.0)

## 2015-01-05 DIAGNOSIS — J9612 Chronic respiratory failure with hypercapnia: Secondary | ICD-10-CM | POA: Diagnosis not present

## 2015-01-05 DIAGNOSIS — I4891 Unspecified atrial fibrillation: Secondary | ICD-10-CM | POA: Diagnosis not present

## 2015-01-18 ENCOUNTER — Other Ambulatory Visit: Payer: Self-pay | Admitting: Family Medicine

## 2015-01-21 DIAGNOSIS — M5136 Other intervertebral disc degeneration, lumbar region: Secondary | ICD-10-CM | POA: Diagnosis not present

## 2015-01-21 DIAGNOSIS — G894 Chronic pain syndrome: Secondary | ICD-10-CM | POA: Diagnosis not present

## 2015-01-21 DIAGNOSIS — M5134 Other intervertebral disc degeneration, thoracic region: Secondary | ICD-10-CM | POA: Diagnosis not present

## 2015-01-21 DIAGNOSIS — Z79891 Long term (current) use of opiate analgesic: Secondary | ICD-10-CM | POA: Diagnosis not present

## 2015-01-28 ENCOUNTER — Other Ambulatory Visit: Payer: Self-pay | Admitting: Family Medicine

## 2015-02-04 DIAGNOSIS — J9612 Chronic respiratory failure with hypercapnia: Secondary | ICD-10-CM | POA: Diagnosis not present

## 2015-02-04 DIAGNOSIS — I4891 Unspecified atrial fibrillation: Secondary | ICD-10-CM | POA: Diagnosis not present

## 2015-02-05 ENCOUNTER — Other Ambulatory Visit: Payer: Self-pay | Admitting: Nurse Practitioner

## 2015-02-05 NOTE — Telephone Encounter (Signed)
Last filled and seen on 01/01/15. [facility]  [facility]

## 2015-02-09 ENCOUNTER — Other Ambulatory Visit: Payer: Self-pay | Admitting: Family Medicine

## 2015-03-07 DIAGNOSIS — I4891 Unspecified atrial fibrillation: Secondary | ICD-10-CM | POA: Diagnosis not present

## 2015-03-07 DIAGNOSIS — J9612 Chronic respiratory failure with hypercapnia: Secondary | ICD-10-CM | POA: Diagnosis not present

## 2015-03-18 ENCOUNTER — Other Ambulatory Visit: Payer: Self-pay | Admitting: Family Medicine

## 2015-03-31 ENCOUNTER — Other Ambulatory Visit: Payer: Self-pay | Admitting: Family Medicine

## 2015-04-07 DIAGNOSIS — I4891 Unspecified atrial fibrillation: Secondary | ICD-10-CM | POA: Diagnosis not present

## 2015-04-07 DIAGNOSIS — J9612 Chronic respiratory failure with hypercapnia: Secondary | ICD-10-CM | POA: Diagnosis not present

## 2015-04-16 DIAGNOSIS — G894 Chronic pain syndrome: Secondary | ICD-10-CM | POA: Diagnosis not present

## 2015-04-16 DIAGNOSIS — Z79891 Long term (current) use of opiate analgesic: Secondary | ICD-10-CM | POA: Diagnosis not present

## 2015-04-16 DIAGNOSIS — M5134 Other intervertebral disc degeneration, thoracic region: Secondary | ICD-10-CM | POA: Diagnosis not present

## 2015-04-16 DIAGNOSIS — M5136 Other intervertebral disc degeneration, lumbar region: Secondary | ICD-10-CM | POA: Diagnosis not present

## 2015-04-29 ENCOUNTER — Other Ambulatory Visit: Payer: Self-pay | Admitting: Nurse Practitioner

## 2015-04-30 ENCOUNTER — Encounter: Payer: Self-pay | Admitting: Family Medicine

## 2015-04-30 NOTE — Telephone Encounter (Signed)
Last seen and last VIT D 01/01/15  [facility]  39.3

## 2015-05-04 ENCOUNTER — Other Ambulatory Visit: Payer: Self-pay | Admitting: Family Medicine

## 2015-05-06 NOTE — Telephone Encounter (Signed)
Last seen 01/01/15  [facility] 

## 2015-05-07 DIAGNOSIS — I4891 Unspecified atrial fibrillation: Secondary | ICD-10-CM | POA: Diagnosis not present

## 2015-05-07 DIAGNOSIS — J9612 Chronic respiratory failure with hypercapnia: Secondary | ICD-10-CM | POA: Diagnosis not present

## 2015-05-13 ENCOUNTER — Other Ambulatory Visit: Payer: Self-pay | Admitting: Family Medicine

## 2015-05-13 NOTE — Telephone Encounter (Signed)
This is okay to refill 

## 2015-05-13 NOTE — Telephone Encounter (Signed)
rx called into pharmacy

## 2015-05-13 NOTE — Telephone Encounter (Signed)
Last filled 03/15/15, last seen 01/01/15. [facility]

## 2015-05-17 ENCOUNTER — Ambulatory Visit: Payer: Medicare Other | Admitting: Family Medicine

## 2015-05-22 ENCOUNTER — Ambulatory Visit (INDEPENDENT_AMBULATORY_CARE_PROVIDER_SITE_OTHER): Payer: Medicare Other | Admitting: Family Medicine

## 2015-05-22 ENCOUNTER — Encounter: Payer: Self-pay | Admitting: Family Medicine

## 2015-05-22 VITALS — BP 123/60 | HR 60 | Temp 97.0°F | Ht 64.0 in | Wt 266.0 lb

## 2015-05-22 DIAGNOSIS — F32A Depression, unspecified: Secondary | ICD-10-CM

## 2015-05-22 DIAGNOSIS — R5383 Other fatigue: Secondary | ICD-10-CM | POA: Diagnosis not present

## 2015-05-22 DIAGNOSIS — I480 Paroxysmal atrial fibrillation: Secondary | ICD-10-CM | POA: Diagnosis not present

## 2015-05-22 DIAGNOSIS — E559 Vitamin D deficiency, unspecified: Secondary | ICD-10-CM

## 2015-05-22 DIAGNOSIS — E034 Atrophy of thyroid (acquired): Secondary | ICD-10-CM

## 2015-05-22 DIAGNOSIS — F329 Major depressive disorder, single episode, unspecified: Secondary | ICD-10-CM

## 2015-05-22 DIAGNOSIS — Z794 Long term (current) use of insulin: Secondary | ICD-10-CM | POA: Diagnosis not present

## 2015-05-22 DIAGNOSIS — I10 Essential (primary) hypertension: Secondary | ICD-10-CM | POA: Diagnosis not present

## 2015-05-22 DIAGNOSIS — F411 Generalized anxiety disorder: Secondary | ICD-10-CM

## 2015-05-22 DIAGNOSIS — E119 Type 2 diabetes mellitus without complications: Secondary | ICD-10-CM

## 2015-05-22 DIAGNOSIS — E785 Hyperlipidemia, unspecified: Secondary | ICD-10-CM | POA: Diagnosis not present

## 2015-05-22 DIAGNOSIS — E0842 Diabetes mellitus due to underlying condition with diabetic polyneuropathy: Secondary | ICD-10-CM | POA: Diagnosis not present

## 2015-05-22 DIAGNOSIS — E038 Other specified hypothyroidism: Secondary | ICD-10-CM | POA: Diagnosis not present

## 2015-05-22 DIAGNOSIS — Z23 Encounter for immunization: Secondary | ICD-10-CM | POA: Diagnosis not present

## 2015-05-22 LAB — POCT GLYCOSYLATED HEMOGLOBIN (HGB A1C): HEMOGLOBIN A1C: 7.5

## 2015-05-22 MED ORDER — NYSTATIN 100000 UNIT/GM EX POWD: CUTANEOUS | Status: DC

## 2015-05-22 NOTE — Patient Instructions (Addendum)
Medicare Annual Wellness Visit  Roxie and the medical providers at [facility] strive to bring you the best medical care.  In doing so we not only want to address your current medical conditions and concerns but also to detect new conditions early and prevent illness, disease and health-related problems.    Medicare offers a yearly Wellness Visit which allows our clinical staff to assess your need for preventative services including immunizations, lifestyle education, counseling to decrease risk of preventable diseases and screening for fall risk and other medical concerns.    This visit is provided free of charge (no copay) for all Medicare recipients. The clinical pharmacists at [facility] have begun to conduct these Wellness Visits which will also include a thorough review of all your medications.    As you primary medical provider recommend that you make an appointment for your Annual Wellness Visit if you have not done so already this year.  You may set up this appointment before you leave today or you may call back [phone] and schedule an appointment.  Please make sure when you call that you mention that you are scheduling your Annual Wellness Visit with the clinical pharmacist so that the appointment may be made for the proper length of time.     Continue current medications. Continue good therapeutic lifestyle changes which include good diet and exercise. Fall precautions discussed with patient. If an FOBT was given today- please return it to our front desk. If you are over 72 years old - you may need Prevnar 13 or the adult Pneumonia vaccine.  **Flu shots are available--- please call and schedule a FLU-CLINIC appointment**  After your visit with us today you will receive a survey in the mail or online from [employer] regarding your care with us. Please take a moment to fill this out. Your feedback is very  important to us as you can help us better understand your patient needs as well as improve your experience and satisfaction. WE CARE ABOUT YOU!!!   The patient should stay as active as possible She should read listen and interact with people assist keeps her mind stimulated and keeps her from developing Alzheimer's or dementia She should drink plenty of fluids and stay well hydrated She should check her blood sugars regularly She needs a visit to the podiatrist We will call with the lab work results as soon as they become available

## 2015-05-22 NOTE — Progress Notes (Signed)
Subjective:    Patient ID: [name], female    DOB: [birth year], 74 y.o.   MRN: [id number]  HPI Pt here for follow up and management of chronic medical problems which includes hypertension, hyperlipidemia ,and diabetes. She is taking medications regularly and she is accompanied today by her son. She thinks the Cymbalta may be helping a bit even though she remains fatigued. She has a history of morbid obesity hypertension hyperlipidemia atrial fibrillation and diabetic neuropathy. The patient comes to the visit today with her son Legrand Como. He lives about 7 miles from her home and is trying to get her to come and live the side of him in a trailer but she's not agree to do this at this point. Her other son has moved to [place]. The patient today denies chest pain or shortness of breath. She is seen Dr. Percival Spanish in the past the cardiologist for her atrial fibrillation and this visit is past due. We will work to get this appointment set up for her. She is swallowing her food without problems and has no nausea vomiting heartburn indigestion blood in the stool or black tarry bowel movements. She still takes pain medication from the orthopedic surgeon and takes this twice a day. She occasionally gets constipation. She understands that she should take as little of this as possible. She is passing her water without problems she does not have good control and has incontinence but there is no burning or pain with voiding. Her appearance is good today and she does smile and seems to be in a better mood. She is alert. She is in her wheelchair and she was examined in her wheelchair.       Patient Active Problem List   Diagnosis Date Noted  . Memory impairment 10/10/2013  . Hypertension 01/26/2013  . Hyperlipemia 01/26/2013  . Hypercapnia 01/08/2013  . CAP (community acquired pneumonia) 01/08/2013  . Chronic respiratory failure ([place]) 01/08/2013  . Congestive heart failure ([place]) 07/22/2012  .  Hypokalemia 07/22/2012  . GAD (generalized anxiety disorder) 07/22/2012  . Depression 07/22/2012  . Atrial fibrillation ([place]) 07/22/2012  . Chronic low back pain 07/22/2012  . Diabetic neuropathy ([place]) 07/22/2012  . Migraine 07/22/2012  . Dyspnea 11/26/2010  . Morbid obesity ([place]) 06/04/2010  . SUPRAVENTRICULAR TACHYCARDIA 06/04/2010  . HYPOTHYROIDISM 04/22/2008  . DIABETES, TYPE 2 04/22/2008  . ALLERGIC RHINITIS 04/22/2008  . SLEEP APNEA 04/22/2008  . COUGH 04/22/2008  . RESTLESS LEGS SYNDROME 04/12/2008  . HYPERSOMNIA 04/12/2008   Outpatient Encounter Prescriptions as of 05/22/2015  Medication Sig  . clonazePAM (KLONOPIN) 1 MG tablet TAKE ONE TABLET BY MOUTH 3 TIMES DAILY AS NEEDED  . DULoxetine (CYMBALTA) 30 MG capsule TAKE 1 CAPSULE (30 MG TOTAL) BY MOUTH EVERY EVENING.  . fluticasone (FLONASE) 50 MCG/ACT nasal spray USE 2 SPRAYS INTO THE NOSE DAILY.  . furosemide (LASIX) 40 MG tablet TAKE 1 TABLET BY MOUTH EVERY DAY  . glucose blood (ONE TOUCH ULTRA TEST) test strip Test 4X a day and prn  Dx 250.02  . insulin aspart (NOVOLOG) 100 UNIT/ML injection Inject 4-16 Units into the skin 3 (three) times daily before meals. Use per sliding scale  . insulin detemir (LEVEMIR) 100 UNIT/ML injection Inject SQ 42 units QAM and 34 units QHS  . levothyroxine (SYNTHROID, LEVOTHROID) 75 MCG tablet TAKE 1 TABLET (75 MCG TOTAL) BY MOUTH DAILY.  Marland Kitchen NITROSTAT 0.4 MG SL tablet USE AS DIRECTED  . nystatin (MYCOSTATIN/NYSTOP) 100000 UNIT/GM POWD USE AS DIRECTED  .  oxyCODONE-acetaminophen (PERCOCET) 10-325 MG per tablet Take 1 tablet by mouth 4 (four) times daily as needed for pain.   . potassium chloride (MICRO-K) 10 MEQ CR capsule TAKE 1 CAPSULE (10 MEQ TOTAL) BY MOUTH DAILY.  . pravastatin (PRAVACHOL) 80 MG tablet TAKE 1 TABLET (80 MG TOTAL) BY MOUTH DAILY.  . sotalol (BETAPACE) 80 MG tablet TAKE 1 AND 1/2 TABLET TWICE A DAY BY MOUTH  . Vitamin D, Ergocalciferol, (DRISDOL) 50000 UNITS CAPS capsule TAKE  1 CAPSULE (50,000 UNITS TOTAL) BY MOUTH EVERY 7 (SEVEN) DAYS.  Marland Kitchen XARELTO 20 MG TABS tablet TAKE 1 TABLET (20 MG TOTAL) BY MOUTH DAILY WITH SUPPER.   No facility-administered encounter medications on file as of 05/22/2015.     Review of Systems  Constitutional: Positive for fatigue (no energy).  HENT: Negative.   Eyes: Negative.   Respiratory: Negative.   Cardiovascular: Negative.   Gastrointestinal: Negative.   Endocrine: Negative.   Genitourinary: Negative.   Musculoskeletal: Negative.   Skin: Negative.   Allergic/Immunologic: Negative.   Neurological: Negative.   Hematological: Negative.   Psychiatric/Behavioral: Negative.        Objective:   Physical Exam  Constitutional: She is oriented to person, place, and time. She appears well-nourished. No distress.  Alert and responsive and morbidly obese more positive in demeanor  HENT:  Head: Normocephalic and atraumatic.  Right Ear: External ear normal.  Left Ear: External ear normal.  Nose: Nose normal.  Mouth/Throat: Oropharynx is clear and moist.  Well-hydrated  Eyes: Conjunctivae and EOM are normal. Pupils are equal, round, and reactive to light. Right eye exhibits no discharge. Left eye exhibits no discharge. No scleral icterus.  Neck: Normal range of motion. Neck supple. No thyromegaly present.  Without bruits thyromegaly or anterior cervical adenopathy  Cardiovascular: Normal rate, regular rhythm, normal heart sounds and intact distal pulses.   No murmur heard. At 60/m with a regular rate and rhythm  Pulmonary/Chest: Effort normal and breath sounds normal. No respiratory distress. She has no wheezes. She has no rales. She exhibits no tenderness.  Clear anteriorly and posteriorly  Abdominal: Soft. Bowel sounds are normal. She exhibits no mass. There is no tenderness. There is no rebound and no guarding.  Morbidly obese without liver or spleen enlargement or epigastric tenderness. No abdominal bruits auscultated.    Musculoskeletal: Normal range of motion. She exhibits no edema or tenderness.  Lymphadenopathy:    She has no cervical adenopathy.  Neurological: She is alert and oriented to person, place, and time.  Skin: Skin is warm and dry. No rash noted.  Generally dry skin but no signs of erythema or rash  Psychiatric: She has a normal mood and affect. Her behavior is normal. Judgment and thought content normal.  Alert and more positive affect  Nursing note and vitals reviewed.   BP 123/60 mmHg  Pulse 60  Temp(Src) 97 F (36.1 C) (Oral)  Ht _0  (1.626 m)  Wt 266 lb (120.657 kg)  BMI 45.64 kg/m2       Assessment & Plan:  1. GAD (generalized anxiety disorder) -The Cymbalta has helped she still is depressed but she is doing better. - CBC with Differential/Platelet  2. Type 2 diabetes mellitus treated with insulin (HCC) -Her previous A1c was 7.1%. The A1c for today is 7.4% and she was encouraged to try to do better with her blood sugar control. - BMP8+EGFR - CBC with Differential/Platelet - POCT glycosylated hemoglobin (Hb A1C)  3. Paroxysmal atrial fibrillation (HCC) -The  patient was in normal sinus rhythm today. She is to follow-up with the cardiologist and this appointment will be arranged soon. She is past due a visit with him. - CBC with Differential/Platelet  4. Essential hypertension The blood pressure is good today and she will continue with her current treatment regimen which is Betapace. - BMP8+EGFR - CBC with Differential/Platelet - Hepatic function panel  5. Hyperlipemia -Cholesterol numbers were good in June and we will recheck her cholesterol today and she should continue with her pravastatin pending the results - CBC with Differential/Platelet - Lipid panel  6. Vitamin D deficiency -Kidney with current vitamin D dosing pending results of lab work - CBC with Differential/Platelet - Vit D  25 hydroxy (rtn osteoporosis monitoring)  7. Other fatigue - Thyroid  Panel With TSH  8. Diabetic polyneuropathy associated with diabetes mellitus due to underlying condition ([place]) -Sensation was intact today  9. Hypothyroidism due to acquired atrophy of thyroid -the patient is having fatigue and we will check a thyroid profile. She will continue the current thyroid dosage pending results of the lab work  10. Depression -She will continue with Cymbalta 30 one daily as this has helped her tremendously with being depressed  11. Encounter for immunization -Flu shot given today.  Meds ordered this encounter  Medications  . OXYCODONE 10 MG 12 hr tablet    Sig: Take 1 tablet by mouth 2 (two) times daily as needed.  . nystatin (MYCOSTATIN/NYSTOP) 100000 UNIT/GM POWD    Sig: USE AS DIRECTED    Dispense:  60 g    Refill:  6   Arrie Senate MD

## 2015-05-23 LAB — CBC WITH DIFFERENTIAL/PLATELET
BASOS: 0 %
Basophils Absolute: 0 10*3/uL (ref 0.0–0.2)
EOS (ABSOLUTE): 0.1 10*3/uL (ref 0.0–0.4)
EOS: 2 %
HEMATOCRIT: 36.5 % (ref 34.0–46.6)
Hemoglobin: 11.6 g/dL (ref 11.1–15.9)
IMMATURE GRANS (ABS): 0 10*3/uL (ref 0.0–0.1)
IMMATURE GRANULOCYTES: 0 %
LYMPHS: 35 %
Lymphocytes Absolute: 2.5 10*3/uL (ref 0.7–3.1)
MCH: 30.9 pg (ref 26.6–33.0)
MCHC: 31.8 g/dL (ref 31.5–35.7)
MCV: 97 fL (ref 79–97)
Monocytes Absolute: 0.8 10*3/uL (ref 0.1–0.9)
Monocytes: 11 %
NEUTROS ABS: 3.6 10*3/uL (ref 1.4–7.0)
NEUTROS PCT: 52 %
PLATELETS: 214 10*3/uL (ref 150–379)
RBC: 3.76 x10E6/uL — ABNORMAL LOW (ref 3.77–5.28)
RDW: 13.4 % (ref 12.3–15.4)
WBC: 7 10*3/uL (ref 3.4–10.8)

## 2015-05-23 LAB — BMP8+EGFR
BUN/Creatinine Ratio: 22 (ref 11–26)
BUN: 14 mg/dL (ref 8–27)
CO2: 29 mmol/L (ref 18–29)
CREATININE: 0.64 mg/dL (ref 0.57–1.00)
Calcium: 8.6 mg/dL — ABNORMAL LOW (ref 8.7–10.3)
Chloride: 102 mmol/L (ref 97–106)
GFR, EST AFRICAN AMERICAN: 103 mL/min/{1.73_m2} (ref >59)
GFR, EST NON AFRICAN AMERICAN: 89 mL/min/{1.73_m2} (ref >59)
Glucose: 384 mg/dL — ABNORMAL HIGH (ref 65–99)
POTASSIUM: 4.4 mmol/L (ref 3.5–5.2)
SODIUM: 143 mmol/L (ref 136–144)

## 2015-05-23 LAB — LIPID PANEL
CHOL/HDL RATIO: 5.9 ratio — AB (ref 0.0–4.4)
Cholesterol, Total: 165 mg/dL (ref 100–199)
HDL: 28 mg/dL — ABNORMAL LOW (ref >39)
LDL Calculated: 101 mg/dL — ABNORMAL HIGH (ref 0–99)
Triglycerides: 179 mg/dL — ABNORMAL HIGH (ref 0–149)
VLDL Cholesterol Cal: 36 mg/dL (ref 5–40)

## 2015-05-23 LAB — THYROID PANEL WITH TSH
Free Thyroxine Index: 1.8 (ref 1.2–4.9)
T3 UPTAKE RATIO: 27 % (ref 24–39)
T4 TOTAL: 6.5 ug/dL (ref 4.5–12.0)
TSH: 2.16 u[IU]/mL (ref 0.450–4.500)

## 2015-05-23 LAB — HEPATIC FUNCTION PANEL
ALK PHOS: 105 IU/L (ref 39–117)
ALT: 14 IU/L (ref 0–32)
AST: 14 IU/L (ref 0–40)
Albumin: 3.3 g/dL — ABNORMAL LOW (ref 3.5–4.8)
BILIRUBIN TOTAL: 0.3 mg/dL (ref 0.0–1.2)
BILIRUBIN, DIRECT: 0.09 mg/dL (ref 0.00–0.40)
Total Protein: 6 g/dL (ref 6.0–8.5)

## 2015-05-23 LAB — VITAMIN D 25 HYDROXY (VIT D DEFICIENCY, FRACTURES): VIT D 25 HYDROXY: 34 ng/mL (ref 30.0–100.0)

## 2015-06-07 DIAGNOSIS — J9612 Chronic respiratory failure with hypercapnia: Secondary | ICD-10-CM | POA: Diagnosis not present

## 2015-06-07 DIAGNOSIS — I4891 Unspecified atrial fibrillation: Secondary | ICD-10-CM | POA: Diagnosis not present

## 2015-06-10 ENCOUNTER — Other Ambulatory Visit: Payer: Self-pay | Admitting: Family Medicine

## 2015-06-11 ENCOUNTER — Ambulatory Visit (INDEPENDENT_AMBULATORY_CARE_PROVIDER_SITE_OTHER): Payer: Medicare Other | Admitting: Family Medicine

## 2015-06-11 ENCOUNTER — Encounter: Payer: Self-pay | Admitting: Family Medicine

## 2015-06-11 VITALS — BP 126/53 | HR 57 | Temp 95.0°F

## 2015-06-11 DIAGNOSIS — L03031 Cellulitis of right toe: Secondary | ICD-10-CM

## 2015-06-11 MED ORDER — DOXYCYCLINE HYCLATE 100 MG PO CAPS: 100.0000 mg | ORAL_CAPSULE | Freq: Two times a day (BID) | ORAL | Status: DC

## 2015-06-11 NOTE — Patient Instructions (Signed)
Great to meet you!  I am treating you for cellulitis with doxycyline  Be sure to finish all of the antibiotics  Cellulitis Cellulitis is an infection of the skin and the tissue under the skin. The infected area is usually red and tender. This happens most often in the arms and lower legs. HOME CARE   Take your antibiotic medicine as told. Finish the medicine even if you start to feel better.  Keep the infected arm or leg raised (elevated).  Put a warm cloth on the area up to 4 times per day.  Only take medicines as told by your doctor.  Keep all doctor visits as told. GET HELP IF:  You see red streaks on the skin coming from the infected area.  Your red area gets bigger or turns a dark color.  Your bone or joint under the infected area is painful after the skin heals.  Your infection comes back in the same area or different area.  You have a puffy (swollen) bump in the infected area.  You have new symptoms.  You have a fever. GET HELP RIGHT AWAY IF:   You feel very sleepy.  You throw up (vomit) or have watery poop (diarrhea).  You feel sick and have muscle aches and pains.   This information is not intended to replace advice given to you by your health care provider. Make sure you discuss any questions you have with your health care provider.   Document Released: 12/16/2007 Document Revised: 03/20/2015 Document Reviewed: 09/14/2011 Elsevier Interactive Patient Education [employer]2016 Elsevier Inc.

## 2015-06-11 NOTE — Progress Notes (Signed)
   HPI  Patient presents today with concern for cellulitis.  Patient explains that she was cutting her right great toe now 2 days ago, following day she trimmed off some skin in the developed redness, pain, and warmth at that site. She's also had some clear drainage from that site. She states that the pain is not that bad but that she's concerned about cellulitis.  She denies any fevers, chills, sweats. She does have well-controlled diabetes and is taking all of her medications like usual. She has not scheduled with podiatry visit yet but plans to call for an appointment.    PMH: Smoking status noted ROS: Per HPI  Objective: BP 126/53 mmHg  Pulse 57  Temp(Src) 95 F (35 C) (Oral)  Ht   Wt  Gen: NAD, alert, cooperative with exam HEENT: NCAT CV: RRR, good S1/S2, no murmur Resp: CTABL, no wheezes, non-labored Ext: No edema, hemosiderin staining Neuro: Alert and oriented, No gross deficits  Skin: Right great toe with erythematous open lesion on the medial side, there is no toenail embedded into the skin, small, approximately 0.5 cm x 1 cm, draining a very small amount of clear fluid, there is erythema extending up to the DIP with slight warmth, no tenderness to palpation Tthickened nails throughout  Assessment and plan:  # Right great toe cellulitis Cellulitis after aggressive training of the toenail Treatment doxycycline, reviewed reasons to return Recommended calling podiatry for appointment considering her thickened toenails   Meds ordered this encounter  Medications  . DISCONTD: OXYCODONE 10 MG 12 hr tablet    Sig:   . doxycycline (VIBRAMYCIN) 100 MG capsule    Sig: Take 1 capsule (100 mg total) by mouth 2 (two) times daily.    Dispense:  20 capsule    Refill:  0    Murtis SinkSam Darienne Belleau, MD [facility] 06/11/2015, 2:56 PM

## 2015-06-18 ENCOUNTER — Other Ambulatory Visit: Payer: Self-pay | Admitting: Family Medicine

## 2015-06-20 ENCOUNTER — Telehealth: Payer: Self-pay | Admitting: Family Medicine

## 2015-06-20 NOTE — Telephone Encounter (Signed)
They will fax authorization today

## 2015-06-21 ENCOUNTER — Other Ambulatory Visit: Payer: Self-pay | Admitting: Family Medicine

## 2015-07-01 DIAGNOSIS — Z794 Long term (current) use of insulin: Secondary | ICD-10-CM | POA: Diagnosis not present

## 2015-07-01 DIAGNOSIS — E1165 Type 2 diabetes mellitus with hyperglycemia: Secondary | ICD-10-CM | POA: Diagnosis not present

## 2015-07-01 DIAGNOSIS — L84 Corns and callosities: Secondary | ICD-10-CM | POA: Diagnosis not present

## 2015-07-01 DIAGNOSIS — E119 Type 2 diabetes mellitus without complications: Secondary | ICD-10-CM | POA: Diagnosis not present

## 2015-07-05 ENCOUNTER — Telehealth: Payer: Self-pay | Admitting: Family Medicine

## 2015-07-05 NOTE — Telephone Encounter (Signed)
Stp and advised can use zyrtec and mucinex plain. Pt voiced understanding.

## 2015-07-05 NOTE — Telephone Encounter (Signed)
Stp and advised as long as their isn't any redness, swelling or purulent drainage it is fine to have slight bleeding.

## 2015-07-07 ENCOUNTER — Other Ambulatory Visit: Payer: Self-pay | Admitting: Family Medicine

## 2015-07-07 DIAGNOSIS — I4891 Unspecified atrial fibrillation: Secondary | ICD-10-CM | POA: Diagnosis not present

## 2015-07-07 DIAGNOSIS — J9612 Chronic respiratory failure with hypercapnia: Secondary | ICD-10-CM | POA: Diagnosis not present

## 2015-07-08 ENCOUNTER — Other Ambulatory Visit: Payer: Self-pay | Admitting: Family Medicine

## 2015-07-09 ENCOUNTER — Other Ambulatory Visit: Payer: Self-pay | Admitting: Family Medicine

## 2015-07-09 MED ORDER — CLONAZEPAM 1 MG PO TABS: ORAL_TABLET | ORAL | Status: DC

## 2015-07-09 NOTE — Telephone Encounter (Signed)
Last filled 05/13/15, last seen 05/22/15. Call in at [facility]

## 2015-07-16 DIAGNOSIS — Z79891 Long term (current) use of opiate analgesic: Secondary | ICD-10-CM | POA: Diagnosis not present

## 2015-07-16 DIAGNOSIS — G894 Chronic pain syndrome: Secondary | ICD-10-CM | POA: Diagnosis not present

## 2015-07-16 DIAGNOSIS — M5136 Other intervertebral disc degeneration, lumbar region: Secondary | ICD-10-CM | POA: Diagnosis not present

## 2015-07-16 DIAGNOSIS — M5134 Other intervertebral disc degeneration, thoracic region: Secondary | ICD-10-CM | POA: Diagnosis not present

## 2015-07-21 ENCOUNTER — Other Ambulatory Visit: Payer: Self-pay | Admitting: Family Medicine

## 2015-07-24 ENCOUNTER — Ambulatory Visit (INDEPENDENT_AMBULATORY_CARE_PROVIDER_SITE_OTHER): Payer: Medicare Other | Admitting: Cardiology

## 2015-07-24 ENCOUNTER — Encounter: Payer: Self-pay | Admitting: Cardiology

## 2015-07-24 VITALS — BP 134/70 | HR 72 | Ht 64.0 in | Wt 270.0 lb

## 2015-07-24 DIAGNOSIS — I480 Paroxysmal atrial fibrillation: Secondary | ICD-10-CM

## 2015-07-24 MED ORDER — NITROGLYCERIN 0.4 MG SL SUBL: SUBLINGUAL_TABLET | SUBLINGUAL | Status: DC

## 2015-07-24 MED ORDER — METOPROLOL TARTRATE 25 MG PO TABS: 25.0000 mg | ORAL_TABLET | Freq: Every day | ORAL | Status: DC | PRN

## 2015-07-24 NOTE — Progress Notes (Signed)
Cardiology Office Note   Date:  07/24/2015   ID:  Narya, Beavin 1941-04-19, MRN [id number]  PCP:  Rudi Heap, MD  Cardiologist:   Rollene Rotunda, MD   No chief complaint on file.     History of Present Illness: [name] is a 75 y.o. female who presents for atrial fibrillation and diastolic dysfunction. She is morbidly obese and gets around in a wheelchair predominantly though she can transfer from chair to bed. She has chronic oxygen. She has sleep apnea she doesn't use CPAP.   She is on chronic anticoagulation. Since I last saw her she has had some palpitations.   She does not have any acute shortness of breath though she's chronically dyspneic. She does have any PND or orthopnea. She has no edema.  She says that the doctor at [facility]  Told her to take an extra sotalol if she thought she was having fibrillation.   She thinks she's had to do this a couple of times since I saw her.   Past Medical History  Diagnosis Date  . Sleep apnea   . Morbid obesity (HCC)   . Allergic rhinitis   . Diabetes mellitus     type 2  . Back pain   . Hypothyroidism   . CAD (coronary artery disease)   . Hyperlipemia   . Fibromyalgia   . Narcolepsy   . SVT (supraventricular tachycardia) (HCC)     afib  . 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 Total abdominal hysterectomy    . Tubal ligation    . Appendectomy       Current Outpatient Prescriptions  Medication Sig Dispense Refill  . clonazePAM (KLONOPIN) 1 MG tablet TAKE ONE TABLET BY MOUTH 3 TIMES DAILY AS NEEDED 90 tablet 0  . DULoxetine (CYMBALTA) 30 MG capsule TAKE 1 CAPSULE (30 MG TOTAL) BY MOUTH EVERY EVENING. 30 capsule 11  . fluticasone (FLONASE) 50 MCG/ACT nasal spray USE 2 SPRAYS INTO THE NOSE DAILY. 16 g 2  . insulin aspart (NOVOLOG) 100 UNIT/ML injection Inject 4-16 Units into the skin 3 (three) times daily before meals. Use per sliding  scale 1 vial 0  . insulin detemir (LEVEMIR) 100 UNIT/ML injection Inject SQ 42 units QAM and 34 units QHS 30 mL 1  . levothyroxine (SYNTHROID, LEVOTHROID) 75 MCG tablet TAKE 1 TABLET (75 MCG TOTAL) BY MOUTH DAILY. 90 tablet 3  . NITROSTAT 0.4 MG SL tablet USE AS DIRECTED 25 tablet 0  . nystatin (MYCOSTATIN/NYSTOP) 100000 UNIT/GM POWD USE AS DIRECTED 60 g 6  . potassium chloride (MICRO-K) 10 MEQ CR capsule TAKE 1 CAPSULE (10 MEQ TOTAL) BY MOUTH DAILY. 30 capsule 4  . pravastatin (PRAVACHOL) 80 MG tablet TAKE 1 TABLET (80 MG TOTAL) BY MOUTH DAILY. 90 tablet 1  . sotalol (BETAPACE) 80 MG tablet TAKE 1 AND 1/2 TABLET TWICE A DAY BY MOUTH 135 tablet 0  . glucose blood (ONE TOUCH ULTRA TEST) test strip Test 4X a day and prn  Dx 250.02 100 each 12  . Vitamin D, Ergocalciferol, (DRISDOL) 50000 units CAPS capsule TAKE 1 CAPSULE (50,000 UNITS TOTAL) BY MOUTH EVERY 7 (SEVEN) DAYS. 12 capsule 0  . XARELTO 20 MG TABS tablet TAKE 1 TABLET (20 MG TOTAL) BY MOUTH DAILY WITH SUPPER. 30 tablet 5   No current facility-administered medications for this visit.    Allergies:   Amitriptyline;  Codeine; Crestor; Detrol; Lipitor; Lisinopril; Penicillins; Seroquel; and Adhesive      ROS:  Please see the history of present illness.   Otherwise, review of systems are positive for none.   All other systems are reviewed and negative.    PHYSICAL EXAM: VS:  BP 134/70 mmHg  Pulse 72  Ht 5\' 4"  (1.626 m)  Wt 270 lb (122.471 kg)  BMI 46.32 kg/m2 , BMI Body mass index is 46.32 kg/(m^2). PHYSICAL EXAM GEN:  No distress NECK:  No jugular venous distention at 90 degrees, waveform within normal limits, carotid upstroke brisk and symmetric, no bruits, no thyromegaly LYMPHATICS:  No cervical adenopathy LUNGS:  Clear to auscultation bilaterally BACK:  No CVA tenderness CHEST:  Unremarkable HEART:  S1 and S2 within normal limits, no S3, no S4, no clicks, no rubs, no murmurs ABD:  Positive bowel sounds normal in frequency in  pitch, no bruits, no rebound, no guarding, unable to assess midline mass or bruit with the patient seated. EXT:  2 plus pulses throughout, no edema, no cyanosis no clubbing SKIN:  No rashes no nodules NEURO:  Cranial nerves II through XII grossly intact, motor grossly intact throughout PSYCH:  Cognitively intact, oriented to person place and time     EKG:  EKG is ordered today. The ekg ordered today demonstrates Sinus rhythm, rate 72, axis within normal limits, intervals within normal limits, no acute ST-T wave changes.   Recent Labs: 01/01/2015: Hemoglobin 12.2 05/22/2015: ALT 14; BUN 14; Creatinine, Ser 0.64; Platelets 214; Potassium 4.4; Sodium 143; TSH 2.160     Wt Readings from Last 3 Encounters:  07/24/15 270 lb (122.471 kg)  05/22/15 266 lb (120.657 kg)  01/01/15 272 lb (123.378 kg)      Other studies Reviewed: Additional studies/ records that were reviewed today include: [facility] [address]. Review of the above records demonstrates:  Please see elsewhere in the note.     ASSESSMENT AND PLAN:  ATRIAL FIB:   She might have had some paroxysms. However, these are brief. I do not want her taking extra sotalol but will give her prescription for when necessary beta blocker should she have any palpitations. She will otherwise remain on the meds as listed including anticoagulation.  DIASTOLIC DYSFUNCTION:  She eats a lot of canned food.   We discussed the high salt content. She's not had any overt volume overload however. She will remain on the meds as listed.   Current medicines are reviewed at length with the patient today.  The patient does not have concerns regarding medicines.  The following changes have been made:  As above  Labs/ tests ordered today include: none  No orders of the defined types were placed in this encounter.     Disposition:   FU with 12 months with me.     Signed, Rollene RotundaJames Keyontae Huckeby, MD  07/24/2015 4:34 PM    [facility]

## 2015-07-24 NOTE — Patient Instructions (Signed)
Medication Instructions:  Start Metoprolol tartrate 25 mg a day as needed for palpitations. Continue all other medications as listed.  Follow-Up: Follow up in 6 months with Dr. Antoine PocheHochrein.  You will receive a letter in the mail 2 months before you are due.  Please call us when you receive this letter to schedule your follow up appointment.  If you need a refill on your cardiac medications before your next appointment, please call your pharmacy.  Thank you for choosing [facility]!!

## 2015-08-06 ENCOUNTER — Other Ambulatory Visit: Payer: Self-pay | Admitting: Family Medicine

## 2015-08-07 DIAGNOSIS — I4891 Unspecified atrial fibrillation: Secondary | ICD-10-CM | POA: Diagnosis not present

## 2015-08-07 DIAGNOSIS — J9612 Chronic respiratory failure with hypercapnia: Secondary | ICD-10-CM | POA: Diagnosis not present

## 2015-08-28 ENCOUNTER — Other Ambulatory Visit: Payer: Self-pay | Admitting: Family Medicine

## 2015-08-28 ENCOUNTER — Telehealth: Payer: Self-pay | Admitting: Family Medicine

## 2015-08-28 ENCOUNTER — Other Ambulatory Visit: Payer: Self-pay | Admitting: *Deleted

## 2015-08-28 MED ORDER — NITROGLYCERIN 0.4 MG SL SUBL: SUBLINGUAL_TABLET | SUBLINGUAL | Status: AC

## 2015-08-28 NOTE — Telephone Encounter (Signed)
RX sent to the pharmacy  

## 2015-08-29 MED ORDER — CLONAZEPAM 1 MG PO TABS: ORAL_TABLET | ORAL | Status: DC

## 2015-08-29 NOTE — Telephone Encounter (Signed)
Pt aware and pharm aware ok- per [facility]

## 2015-08-29 NOTE — Telephone Encounter (Signed)
Last seen 08/10/14   [facility]  PCP  If approved route to nurse [facility]

## 2015-08-29 NOTE — Telephone Encounter (Signed)
Closing encounter, taken care of in another encounter this morning

## 2015-09-07 DIAGNOSIS — I4891 Unspecified atrial fibrillation: Secondary | ICD-10-CM | POA: Diagnosis not present

## 2015-09-07 DIAGNOSIS — J9612 Chronic respiratory failure with hypercapnia: Secondary | ICD-10-CM | POA: Diagnosis not present

## 2015-09-20 ENCOUNTER — Telehealth: Payer: Self-pay | Admitting: Family Medicine

## 2015-09-20 ENCOUNTER — Ambulatory Visit: Payer: Medicare Other | Admitting: Family Medicine

## 2015-09-20 DIAGNOSIS — J9610 Chronic respiratory failure, unspecified whether with hypoxia or hypercapnia: Secondary | ICD-10-CM

## 2015-09-20 DIAGNOSIS — I10 Essential (primary) hypertension: Secondary | ICD-10-CM

## 2015-09-20 DIAGNOSIS — I4891 Unspecified atrial fibrillation: Secondary | ICD-10-CM

## 2015-09-20 DIAGNOSIS — I509 Heart failure, unspecified: Secondary | ICD-10-CM

## 2015-09-20 DIAGNOSIS — F411 Generalized anxiety disorder: Secondary | ICD-10-CM

## 2015-09-23 ENCOUNTER — Encounter: Payer: Self-pay | Admitting: Family Medicine

## 2015-09-23 NOTE — Telephone Encounter (Signed)
rx written - pt aware

## 2015-09-23 NOTE — Telephone Encounter (Signed)
Please do a wheelchair prescription for this patient

## 2015-09-26 ENCOUNTER — Other Ambulatory Visit: Payer: Self-pay | Admitting: Family Medicine

## 2015-09-27 DIAGNOSIS — I5020 Unspecified systolic (congestive) heart failure: Secondary | ICD-10-CM | POA: Diagnosis not present

## 2015-09-27 DIAGNOSIS — J449 Chronic obstructive pulmonary disease, unspecified: Secondary | ICD-10-CM | POA: Diagnosis not present

## 2015-09-27 DIAGNOSIS — J9612 Chronic respiratory failure with hypercapnia: Secondary | ICD-10-CM | POA: Diagnosis not present

## 2015-09-27 DIAGNOSIS — I259 Chronic ischemic heart disease, unspecified: Secondary | ICD-10-CM | POA: Diagnosis not present

## 2015-09-27 DIAGNOSIS — I5042 Chronic combined systolic (congestive) and diastolic (congestive) heart failure: Secondary | ICD-10-CM | POA: Diagnosis not present

## 2015-10-03 ENCOUNTER — Encounter: Payer: Self-pay | Admitting: Family Medicine

## 2015-10-03 ENCOUNTER — Ambulatory Visit (INDEPENDENT_AMBULATORY_CARE_PROVIDER_SITE_OTHER): Payer: Medicare Other | Admitting: Family Medicine

## 2015-10-03 DIAGNOSIS — E785 Hyperlipidemia, unspecified: Secondary | ICD-10-CM | POA: Diagnosis not present

## 2015-10-03 DIAGNOSIS — I480 Paroxysmal atrial fibrillation: Secondary | ICD-10-CM | POA: Diagnosis not present

## 2015-10-03 DIAGNOSIS — Z1211 Encounter for screening for malignant neoplasm of colon: Secondary | ICD-10-CM | POA: Diagnosis not present

## 2015-10-03 DIAGNOSIS — E034 Atrophy of thyroid (acquired): Secondary | ICD-10-CM

## 2015-10-03 DIAGNOSIS — E0842 Diabetes mellitus due to underlying condition with diabetic polyneuropathy: Secondary | ICD-10-CM

## 2015-10-03 DIAGNOSIS — I739 Peripheral vascular disease, unspecified: Secondary | ICD-10-CM

## 2015-10-03 DIAGNOSIS — E119 Type 2 diabetes mellitus without complications: Secondary | ICD-10-CM

## 2015-10-03 DIAGNOSIS — E038 Other specified hypothyroidism: Secondary | ICD-10-CM

## 2015-10-03 DIAGNOSIS — Z794 Long term (current) use of insulin: Secondary | ICD-10-CM

## 2015-10-03 DIAGNOSIS — G4730 Sleep apnea, unspecified: Secondary | ICD-10-CM

## 2015-10-03 DIAGNOSIS — F411 Generalized anxiety disorder: Secondary | ICD-10-CM

## 2015-10-03 DIAGNOSIS — E559 Vitamin D deficiency, unspecified: Secondary | ICD-10-CM | POA: Diagnosis not present

## 2015-10-03 DIAGNOSIS — I509 Heart failure, unspecified: Secondary | ICD-10-CM | POA: Diagnosis not present

## 2015-10-03 DIAGNOSIS — I10 Essential (primary) hypertension: Secondary | ICD-10-CM

## 2015-10-03 DIAGNOSIS — R350 Frequency of micturition: Secondary | ICD-10-CM

## 2015-10-03 LAB — MICROSCOPIC EXAMINATION
Epithelial Cells (non renal): >10 /hpf — AB (ref 0–10)
WBC UA: NONE SEEN /HPF (ref 0–?)

## 2015-10-03 LAB — URINALYSIS, COMPLETE
Bilirubin, UA: NEGATIVE
Ketones, UA: NEGATIVE
Leukocytes, UA: NEGATIVE
NITRITE UA: NEGATIVE
PH UA: 7 (ref 5.0–7.5)
PROTEIN UA: NEGATIVE
Specific Gravity, UA: 1.015 (ref 1.005–1.030)
UUROB: 0.2 mg/dL (ref 0.2–1.0)

## 2015-10-03 MED ORDER — DONEPEZIL HCL 5 MG PO TABS: 5.0000 mg | ORAL_TABLET | Freq: Every day | ORAL | Status: DC

## 2015-10-03 NOTE — Addendum Note (Signed)
Addended by: Magdalene RiverBULLINS, Cloria Ciresi H on: 10/03/2015 03:45 PM   Modules accepted: Orders

## 2015-10-03 NOTE — Progress Notes (Signed)
Subjective:    Patient ID: [name], female    DOB: 10/03/1940, 75 y.o.   MRN: [id number]  HPI Pt here for follow up and management of chronic medical problems which includes diabetes, hyperlipidemia, and hypertension. She is taking medications regularly. She is accompanied today by her son. The patient is calm and pleasant. Both the patient and her son indicate that she may have problems with her memory. We will plan to do an MMSE before she leaves. She had an appointment with the podiatrist but cancel this appointment. She has thickened toenails. She denies any chest pain other than occasional sharp pains that come and go. She has no shortness of breath anymore than usual. She is swallowing her food without problems and has no heartburn indigestion nausea vomiting diarrhea or blood in the stool. She has urinary frequency without burning. She checks her blood sugars and indicates that they run around 200 in the morning and maybe as high as 300 during the day. She says that she does not eat correctly and that is why the sugars are elevated. She does use Afrin nasal spray and we encouraged her to stop this and use nasal saline. We also discourage the use of Zyrtec.       Patient Active Problem List   Diagnosis Date Noted  . Memory impairment 10/10/2013  . Hypertension 01/26/2013  . Hyperlipemia 01/26/2013  . Hypercapnia 01/08/2013  . CAP (community acquired pneumonia) 01/08/2013  . Chronic respiratory failure ([place]) 01/08/2013  . Congestive heart failure ([place]) 07/22/2012  . Hypokalemia 07/22/2012  . GAD (generalized anxiety disorder) 07/22/2012  . Depression 07/22/2012  . Atrial fibrillation ([place]) 07/22/2012  . Chronic low back pain 07/22/2012  . Diabetic neuropathy ([place]) 07/22/2012  . Migraine 07/22/2012  . Dyspnea 11/26/2010  . Morbid obesity ([place]) 06/04/2010  . SUPRAVENTRICULAR TACHYCARDIA 06/04/2010  . Hypothyroidism 04/22/2008  . DIABETES, TYPE 2 04/22/2008  . ALLERGIC  RHINITIS 04/22/2008  . Sleep apnea 04/22/2008  . COUGH 04/22/2008  . RESTLESS LEGS SYNDROME 04/12/2008  . HYPERSOMNIA 04/12/2008   Outpatient Encounter Prescriptions as of 10/03/2015  Medication Sig  . cetirizine (ZYRTEC) 10 MG tablet Take 10 mg by mouth daily.  . clonazePAM (KLONOPIN) 1 MG tablet TAKE ONE TABLET BY MOUTH 3 TIMES DAILY AS NEEDED  . DULoxetine (CYMBALTA) 30 MG capsule TAKE 1 CAPSULE (30 MG TOTAL) BY MOUTH EVERY EVENING.  . fluticasone (FLONASE) 50 MCG/ACT nasal spray USE 2 SPRAYS INTO THE NOSE DAILY.  Marland Kitchen glucose blood (ONE TOUCH ULTRA TEST) test strip Test 4X a day and prn  Dx 250.02  . insulin aspart (NOVOLOG) 100 UNIT/ML injection Inject 4-16 Units into the skin 3 (three) times daily before meals. Use per sliding scale  . insulin detemir (LEVEMIR) 100 UNIT/ML injection Inject SQ 42 units QAM and 34 units QHS  . levothyroxine (SYNTHROID, LEVOTHROID) 75 MCG tablet TAKE 1 TABLET (75 MCG TOTAL) BY MOUTH DAILY.  . metoprolol tartrate (LOPRESSOR) 25 MG tablet Take 1 tablet (25 mg total) by mouth daily as needed.  . nitroGLYCERIN (NITROSTAT) 0.4 MG SL tablet USE AS DIRECTED  . nystatin (MYCOSTATIN/NYSTOP) 100000 UNIT/GM POWD USE AS DIRECTED  . potassium chloride (MICRO-K) 10 MEQ CR capsule TAKE 1 CAPSULE (10 MEQ TOTAL) BY MOUTH DAILY.  . pravastatin (PRAVACHOL) 80 MG tablet TAKE 1 TABLET (80 MG TOTAL) BY MOUTH DAILY.  . sotalol (BETAPACE) 80 MG tablet TAKE 1 AND 1/2 TABLET TWICE A DAY BY MOUTH  . Vitamin D, Ergocalciferol, (DRISDOL)  50000 units CAPS capsule TAKE 1 CAPSULE (50,000 UNITS TOTAL) BY MOUTH EVERY 7 (SEVEN) DAYS.  Marland Kitchen XARELTO 20 MG TABS tablet TAKE 1 TABLET (20 MG TOTAL) BY MOUTH DAILY WITH SUPPER.   No facility-administered encounter medications on file as of 10/03/2015.      Review of Systems  Constitutional: Negative.   HENT: Negative.   Eyes: Negative.   Respiratory: Negative.   Cardiovascular: Negative.   Gastrointestinal: Negative.   Endocrine: Negative.     Genitourinary: Positive for frequency.  Musculoskeletal: Negative.   Skin: Negative.   Allergic/Immunologic: Negative.   Neurological: Negative.   Hematological: Negative.   Psychiatric/Behavioral: Negative.        Decreases memory per son        Objective:   Physical Exam  Constitutional: She is oriented to person, place, and time. She appears well-developed and well-nourished. No distress.  HENT:  Head: Normocephalic and atraumatic.  Right Ear: External ear normal.  Left Ear: External ear normal.  Nose: Nose normal.  Mouth/Throat: Oropharynx is clear and moist.  Eyes: Conjunctivae and EOM are normal. Pupils are equal, round, and reactive to light. Right eye exhibits no discharge. Left eye exhibits no discharge. No scleral icterus.  Neck: Normal range of motion. Neck supple. No thyromegaly present.  Cardiovascular: Normal rate, regular rhythm and normal heart sounds.   No murmur heard. Distal pulses were difficult to palpate The heart has a regular rate and rhythm at 72/m  Pulmonary/Chest: Effort normal and breath sounds normal. No respiratory distress. She has no wheezes. She has no rales. She exhibits no tenderness.  Clear anteriorly and posteriorly  Abdominal: Soft. Bowel sounds are normal. She exhibits no mass. There is no tenderness. There is no rebound and no guarding.  Morbid obese without masses tenderness or any detectable organ enlargement. The patient was examined in the wheelchair.  Musculoskeletal: She exhibits no edema or tenderness.  Patient in wheelchair due to morbid obesity  Lymphadenopathy:    She has no cervical adenopathy.  Neurological: She is alert and oriented to person, place, and time. No cranial nerve deficit.  Skin: Skin is warm and dry. No rash noted.  Dry skin on the feet. Right great toenail slightly irritated.  Psychiatric: She has a normal mood and affect. Her behavior is normal. Judgment and thought content normal.  Somewhat depressed affect.   Nursing note and vitals reviewed.  BP 158/66 mmHg  Pulse 71  Temp(Src) 97.6 F (36.4 C) (Oral)  Ht '5\' 4"'$  (1.626 m)  Wt 276 lb (125.193 kg)  BMI 47.35 kg/m2        Assessment & Plan:  1. Congestive heart failure, unspecified congestive heart failure chronicity, unspecified congestive heart failure type ([place]) -This appears stable and the patient should continue to follow-up with the cardiologist - CBC with Differential/Platelet  2. Morbid obesity, unspecified obesity type ([place]) -She should continue to make every effort to reduce her caloric intake and lose weight and we've been attempting to encourage her to do this for years and we have not been successful with this. - CBC with Differential/Platelet  3. GAD (generalized anxiety disorder) -This appears stable - CBC with Differential/Platelet  4. Essential hypertension -The blood pressure is elevated today. We will continue current treatment. - BMP8+EGFR - CBC with Differential/Platelet - Hepatic function panel  5. Type 2 diabetes mellitus treated with insulin ([place]) -Continue current treatment pending results of lab work - CBC with Differential/Platelet - Microalbumin / creatinine urine ratio  6. Paroxysmal  atrial fibrillation (HCC) -Rhythm is regular today and she should continue with her medicines and follow-up with the cardiologist as planned - CBC with Differential/Platelet  7. Hyperlipemia -Continue pravastatin and aggressive therapeutic lifestyle changes - CBC with Differential/Platelet - Lipid panel  8. Vitamin D deficiency -Continue vitamin D replacement pending results of lab work - CBC with Differential/Platelet - VITAMIN D 25 Hydroxy (Vit-D Deficiency, Fractures)  9. Special screening for malignant neoplasms, colon - Fecal occult blood, imunochemical; Future  10. Sleep apnea  11. Hypothyroidism due to acquired atrophy of thyroid -Continue current treatment pending results of lab work  22. Diabetic  polyneuropathy associated with diabetes mellitus due to underlying condition ([place]) -Continue tighter blood sugar control with diet and checking blood sugars more frequently  13. Urine frequency - Urinalysis, Complete - Urine culture  14. Peripheral vascular insufficiency (HCC) -Diminished pulses in both feet. Make sure that a trained professional trims her toenails. Schedule visit with the podiatrist  Patient Instructions                       Medicare Annual Wellness Visit  [place] and the medical providers at [facility] strive to bring you the best medical care.  In doing so we not only want to address your current medical conditions and concerns but also to detect new conditions early and prevent illness, disease and health-related problems.    Medicare offers a yearly Wellness Visit which allows our clinical staff to assess your need for preventative services including immunizations, lifestyle education, counseling to decrease risk of preventable diseases and screening for fall risk and other medical concerns.    This visit is provided free of charge (no copay) for all Medicare recipients. The clinical pharmacists at [facility] have begun to conduct these Wellness Visits which will also include a thorough review of all your medications.    As you primary medical provider recommend that you make an appointment for your Annual Wellness Visit if you have not done so already this year.  You may set up this appointment before you leave today or you may call back ([phone]) and schedule an appointment.  Please make sure when you call that you mention that you are scheduling your Annual Wellness Visit with the clinical pharmacist so that the appointment may be made for the proper length of time.     Continue current medications. Continue good therapeutic lifestyle changes which include good diet and exercise. Fall precautions discussed with  patient. If an FOBT was given today- please return it to our front desk. If you are over 21 years old - you may need Prevnar 52 or the adult Pneumonia vaccine.  **Flu shots are available--- please call and schedule a FLU-CLINIC appointment**  After your visit with [place] today you will receive a survey in the mail or online from [employer] regarding your care with [place]. Please take a moment to fill this out. Your feedback is very important to [place] as you can help [place] better understand your patient needs as well as improve your experience and satisfaction. WE CARE ABOUT YOU!!!   Continue with current medicine Stay is active physically as possible Drink plenty of fluids especially water and stay well hydrated Exercise extremities several times daily Check blood sugars regularly Check feet regularly Use nasal saline frequently Avoid sedating antihistamines-----Claritin and Allegra okay do not use Zyrtec or Benadryl Keep the house as cool as possible and use a  cool mist humidifier Make an appointment with the podiatrist to trim toenails Check blood sugars regularly   Arrie Senate MD

## 2015-10-03 NOTE — Patient Instructions (Addendum)
Medicare Annual Wellness Visit  [place] and the medical providers at [facility] strive to bring you the best medical care.  In doing so we not only want to address your current medical conditions and concerns but also to detect new conditions early and prevent illness, disease and health-related problems.    Medicare offers a yearly Wellness Visit which allows our clinical staff to assess your need for preventative services including immunizations, lifestyle education, counseling to decrease risk of preventable diseases and screening for fall risk and other medical concerns.    This visit is provided free of charge (no copay) for all Medicare recipients. The clinical pharmacists at [facility] have begun to conduct these Wellness Visits which will also include a thorough review of all your medications.    As you primary medical provider recommend that you make an appointment for your Annual Wellness Visit if you have not done so already this year.  You may set up this appointment before you leave today or you may call back [phone] and schedule an appointment.  Please make sure when you call that you mention that you are scheduling your Annual Wellness Visit with the clinical pharmacist so that the appointment may be made for the proper length of time.     Continue current medications. Continue good therapeutic lifestyle changes which include good diet and exercise. Fall precautions discussed with patient. If an FOBT was given today- please return it to our front desk. If you are over 73 years old - you may need Prevnar 13 or the adult Pneumonia vaccine.  **Flu shots are available--- please call and schedule a FLU-CLINIC appointment**  After your visit with us today you will receive a survey in the mail or online from [employer] regarding your care with us. Please take a moment to fill this out. Your feedback is very  important to us as you can help us better understand your patient needs as well as improve your experience and satisfaction. WE CARE ABOUT YOU!!!   Continue with current medicine Stay is active physically as possible Drink plenty of fluids especially water and stay well hydrated Exercise extremities several times daily Check blood sugars regularly Check feet regularly Use nasal saline frequently Avoid sedating antihistamines-----Claritin and Allegra okay do not use Zyrtec or Benadryl Keep the house as cool as possible and use a cool mist humidifier Make an appointment with the podiatrist to trim toenails Check blood sugars regularly

## 2015-10-04 LAB — CBC WITH DIFFERENTIAL/PLATELET
BASOS: 0 %
Basophils Absolute: 0 10*3/uL (ref 0.0–0.2)
EOS (ABSOLUTE): 0.1 10*3/uL (ref 0.0–0.4)
EOS: 2 %
HEMATOCRIT: 39 % (ref 34.0–46.6)
HEMOGLOBIN: 12.7 g/dL (ref 11.1–15.9)
IMMATURE GRANS (ABS): 0 10*3/uL (ref 0.0–0.1)
IMMATURE GRANULOCYTES: 0 %
LYMPHS: 29 %
Lymphocytes Absolute: 2.2 10*3/uL (ref 0.7–3.1)
MCH: 31.1 pg (ref 26.6–33.0)
MCHC: 32.6 g/dL (ref 31.5–35.7)
MCV: 95 fL (ref 79–97)
MONOCYTES: 9 %
Monocytes Absolute: 0.6 10*3/uL (ref 0.1–0.9)
NEUTROS ABS: 4.5 10*3/uL (ref 1.4–7.0)
NEUTROS PCT: 60 %
PLATELETS: 239 10*3/uL (ref 150–379)
RBC: 4.09 x10E6/uL (ref 3.77–5.28)
RDW: 13.7 % (ref 12.3–15.4)
WBC: 7.5 10*3/uL (ref 3.4–10.8)

## 2015-10-04 LAB — LIPID PANEL
CHOLESTEROL TOTAL: 179 mg/dL (ref 100–199)
Chol/HDL Ratio: 5.4 ratio units — ABNORMAL HIGH (ref 0.0–4.4)
HDL: 33 mg/dL — AB (ref >39)
LDL Calculated: 107 mg/dL — ABNORMAL HIGH (ref 0–99)
Triglycerides: 193 mg/dL — ABNORMAL HIGH (ref 0–149)
VLDL Cholesterol Cal: 39 mg/dL (ref 5–40)

## 2015-10-04 LAB — BMP8+EGFR
BUN/Creatinine Ratio: 14 (ref 11–26)
BUN: 9 mg/dL (ref 8–27)
CO2: 27 mmol/L (ref 18–29)
CREATININE: 0.63 mg/dL (ref 0.57–1.00)
Calcium: 9.2 mg/dL (ref 8.7–10.3)
Chloride: 96 mmol/L (ref 96–106)
GFR, EST AFRICAN AMERICAN: 103 mL/min/{1.73_m2} (ref >59)
GFR, EST NON AFRICAN AMERICAN: 89 mL/min/{1.73_m2} (ref >59)
Glucose: 449 mg/dL (ref 65–99)
Potassium: 4 mmol/L (ref 3.5–5.2)
Sodium: 140 mmol/L (ref 134–144)

## 2015-10-04 LAB — MICROALBUMIN / CREATININE URINE RATIO
CREATININE, UR: 25.9 mg/dL
MICROALB/CREAT RATIO: 13.1 mg/g{creat} (ref 0.0–30.0)
Microalbumin, Urine: 3.4 ug/mL

## 2015-10-04 LAB — HEPATIC FUNCTION PANEL
ALT: 16 IU/L (ref 0–32)
AST: 24 IU/L (ref 0–40)
Albumin: 3.6 g/dL (ref 3.5–4.8)
Alkaline Phosphatase: 128 IU/L — ABNORMAL HIGH (ref 39–117)
BILIRUBIN TOTAL: 0.3 mg/dL (ref 0.0–1.2)
BILIRUBIN, DIRECT: 0.1 mg/dL (ref 0.00–0.40)
Total Protein: 6.7 g/dL (ref 6.0–8.5)

## 2015-10-04 LAB — VITAMIN D 25 HYDROXY (VIT D DEFICIENCY, FRACTURES): VIT D 25 HYDROXY: 31.1 ng/mL (ref 30.0–100.0)

## 2015-10-05 DIAGNOSIS — I4891 Unspecified atrial fibrillation: Secondary | ICD-10-CM | POA: Diagnosis not present

## 2015-10-05 DIAGNOSIS — J9612 Chronic respiratory failure with hypercapnia: Secondary | ICD-10-CM | POA: Diagnosis not present

## 2015-10-05 LAB — URINE CULTURE

## 2015-10-07 ENCOUNTER — Ambulatory Visit: Payer: Medicare Other | Admitting: Family Medicine

## 2015-10-08 ENCOUNTER — Other Ambulatory Visit: Payer: Self-pay | Admitting: Family Medicine

## 2015-10-08 DIAGNOSIS — Z79891 Long term (current) use of opiate analgesic: Secondary | ICD-10-CM | POA: Diagnosis not present

## 2015-10-08 DIAGNOSIS — G894 Chronic pain syndrome: Secondary | ICD-10-CM | POA: Diagnosis not present

## 2015-10-08 DIAGNOSIS — M5136 Other intervertebral disc degeneration, lumbar region: Secondary | ICD-10-CM | POA: Diagnosis not present

## 2015-10-08 DIAGNOSIS — M5134 Other intervertebral disc degeneration, thoracic region: Secondary | ICD-10-CM | POA: Diagnosis not present

## 2015-10-09 NOTE — Telephone Encounter (Signed)
Last seen and last VIT D 10/03/15 DWM   31.1

## 2015-10-10 ENCOUNTER — Encounter: Payer: Self-pay | Admitting: *Deleted

## 2015-10-10 ENCOUNTER — Telehealth: Payer: Self-pay

## 2015-10-10 NOTE — Telephone Encounter (Signed)
Insurance prior authorized Xarelto through 07/12/16 

## 2015-10-21 DIAGNOSIS — I110 Hypertensive heart disease with heart failure: Secondary | ICD-10-CM | POA: Diagnosis not present

## 2015-10-21 DIAGNOSIS — E119 Type 2 diabetes mellitus without complications: Secondary | ICD-10-CM | POA: Diagnosis not present

## 2015-10-21 DIAGNOSIS — I498 Other specified cardiac arrhythmias: Secondary | ICD-10-CM | POA: Diagnosis not present

## 2015-10-21 DIAGNOSIS — I25119 Atherosclerotic heart disease of native coronary artery with unspecified angina pectoris: Secondary | ICD-10-CM | POA: Diagnosis not present

## 2015-10-21 DIAGNOSIS — Z87891 Personal history of nicotine dependence: Secondary | ICD-10-CM | POA: Diagnosis not present

## 2015-10-21 DIAGNOSIS — I472 Ventricular tachycardia: Secondary | ICD-10-CM | POA: Diagnosis not present

## 2015-10-21 DIAGNOSIS — E039 Hypothyroidism, unspecified: Secondary | ICD-10-CM | POA: Diagnosis not present

## 2015-10-21 DIAGNOSIS — I4589 Other specified conduction disorders: Secondary | ICD-10-CM | POA: Diagnosis not present

## 2015-10-21 DIAGNOSIS — Z7951 Long term (current) use of inhaled steroids: Secondary | ICD-10-CM | POA: Diagnosis not present

## 2015-10-21 DIAGNOSIS — I071 Rheumatic tricuspid insufficiency: Secondary | ICD-10-CM | POA: Diagnosis not present

## 2015-10-21 DIAGNOSIS — Z7901 Long term (current) use of anticoagulants: Secondary | ICD-10-CM | POA: Diagnosis not present

## 2015-10-21 DIAGNOSIS — I471 Supraventricular tachycardia: Secondary | ICD-10-CM | POA: Diagnosis not present

## 2015-10-21 DIAGNOSIS — I4581 Long QT syndrome: Secondary | ICD-10-CM | POA: Diagnosis not present

## 2015-10-21 DIAGNOSIS — Z8249 Family history of ischemic heart disease and other diseases of the circulatory system: Secondary | ICD-10-CM | POA: Diagnosis not present

## 2015-10-21 DIAGNOSIS — R000 Tachycardia, unspecified: Secondary | ICD-10-CM | POA: Diagnosis not present

## 2015-10-21 DIAGNOSIS — Z993 Dependence on wheelchair: Secondary | ICD-10-CM | POA: Diagnosis not present

## 2015-10-21 DIAGNOSIS — R9431 Abnormal electrocardiogram [ECG] [EKG]: Secondary | ICD-10-CM | POA: Diagnosis not present

## 2015-10-21 DIAGNOSIS — I2510 Atherosclerotic heart disease of native coronary artery without angina pectoris: Secondary | ICD-10-CM | POA: Diagnosis not present

## 2015-10-21 DIAGNOSIS — I272 Other secondary pulmonary hypertension: Secondary | ICD-10-CM | POA: Diagnosis not present

## 2015-10-21 DIAGNOSIS — Z79899 Other long term (current) drug therapy: Secondary | ICD-10-CM | POA: Diagnosis not present

## 2015-10-21 DIAGNOSIS — I509 Heart failure, unspecified: Secondary | ICD-10-CM | POA: Diagnosis not present

## 2015-10-21 DIAGNOSIS — I493 Ventricular premature depolarization: Secondary | ICD-10-CM | POA: Diagnosis not present

## 2015-10-21 DIAGNOSIS — I4891 Unspecified atrial fibrillation: Secondary | ICD-10-CM | POA: Diagnosis not present

## 2015-10-21 DIAGNOSIS — R7989 Other specified abnormal findings of blood chemistry: Secondary | ICD-10-CM | POA: Diagnosis not present

## 2015-10-21 DIAGNOSIS — Z794 Long term (current) use of insulin: Secondary | ICD-10-CM | POA: Diagnosis not present

## 2015-10-21 DIAGNOSIS — R0789 Other chest pain: Secondary | ICD-10-CM | POA: Diagnosis not present

## 2015-10-21 DIAGNOSIS — G4733 Obstructive sleep apnea (adult) (pediatric): Secondary | ICD-10-CM | POA: Diagnosis not present

## 2015-10-23 DIAGNOSIS — I4589 Other specified conduction disorders: Secondary | ICD-10-CM | POA: Diagnosis not present

## 2015-10-28 DIAGNOSIS — I259 Chronic ischemic heart disease, unspecified: Secondary | ICD-10-CM | POA: Diagnosis not present

## 2015-10-28 DIAGNOSIS — I5042 Chronic combined systolic (congestive) and diastolic (congestive) heart failure: Secondary | ICD-10-CM | POA: Diagnosis not present

## 2015-10-30 ENCOUNTER — Ambulatory Visit: Payer: Medicare Other | Admitting: Family Medicine

## 2015-10-31 ENCOUNTER — Encounter: Payer: Self-pay | Admitting: Family Medicine

## 2015-10-31 ENCOUNTER — Ambulatory Visit (INDEPENDENT_AMBULATORY_CARE_PROVIDER_SITE_OTHER): Payer: Medicare Other | Admitting: Family Medicine

## 2015-10-31 VITALS — BP 144/59 | HR 68 | Temp 97.5°F | Ht 64.0 in | Wt 275.0 lb

## 2015-10-31 DIAGNOSIS — E1169 Type 2 diabetes mellitus with other specified complication: Secondary | ICD-10-CM | POA: Diagnosis not present

## 2015-10-31 DIAGNOSIS — I471 Supraventricular tachycardia: Secondary | ICD-10-CM

## 2015-10-31 DIAGNOSIS — E785 Hyperlipidemia, unspecified: Secondary | ICD-10-CM | POA: Diagnosis not present

## 2015-10-31 DIAGNOSIS — I10 Essential (primary) hypertension: Secondary | ICD-10-CM | POA: Diagnosis not present

## 2015-10-31 NOTE — Patient Instructions (Signed)
The patient's son will call our office when his mother is moving closer to him and we will set up physical therapy and home health to come in and visit her and work with him to help him with her diet and also to help with her gait strengthening so that she can walk more. The patient's son seems very receptive and willing to help his mother and the patient seems more willing to receive this helps She will continue to follow-[facility] She will continue to wear the monitor that she is wearing so that he can help get her rhythm regulator and completely She will continue with the treatment medication changes as recommended at the hospital on her recent hospital stay

## 2015-10-31 NOTE — Progress Notes (Signed)
Subjective:    Patient ID: [name], female    DOB: 1941-03-15, 75 y.o.   MRN: [id number]  HPI  Patient here today for hospital follow up from [facility]. She was admitted with Diagnosis of SVT, AIVR and Coronary Artery disease. She is accompanied today by her son. She was hospitalized at [facility] on April 12. She is back in for a follow-up from that hospitalization when she was admitted with supraventricular tachycardia and shortness of breath. She had a cardiac cath troponins and echocardiogram and they did an A1c that was 8.6%. She has an appointment for follow-up with the cardiologist in May on May 15. On her discharge she was started on Xarelto daily Plavix daily and an 81 mg aspirin daily. Her sotalol was discontinued and she was started on metoprolol 50 mg twice daily. The recent hospital summary at discharge was reviewed before going into the room. The patient has had only one brief episode of her heart being irregular. It was near the time when she took a metoprolol and this quickly corrected the problem. She otherwise denies any chest pain or shortness of breath. She expresses a desire to want to walk more but has not walked but said very little that she is too weak to do this. There is no gastrointestinal complaints or voiding complaints today.     Patient Active Problem List   Diagnosis Date Noted  . Memory impairment 10/10/2013  . Hypertension 01/26/2013  . Hyperlipemia 01/26/2013  . Hypercapnia 01/08/2013  . CAP (community acquired pneumonia) 01/08/2013  . Chronic respiratory failure ([place]) 01/08/2013  . Congestive heart failure ([place]) 07/22/2012  . Hypokalemia 07/22/2012  . GAD (generalized anxiety disorder) 07/22/2012  . Depression 07/22/2012  . Atrial fibrillation ([place]) 07/22/2012  . Chronic low back pain 07/22/2012  . Diabetic neuropathy ([place]) 07/22/2012  . Migraine 07/22/2012  . Dyspnea 11/26/2010  . Morbid obesity ([place]) 06/04/2010  . SUPRAVENTRICULAR  TACHYCARDIA 06/04/2010  . Hypothyroidism 04/22/2008  . DIABETES, TYPE 2 04/22/2008  . ALLERGIC RHINITIS 04/22/2008  . Sleep apnea 04/22/2008  . COUGH 04/22/2008  . RESTLESS LEGS SYNDROME 04/12/2008  . HYPERSOMNIA 04/12/2008   Outpatient Encounter Prescriptions as of 10/31/2015  Medication Sig  . aspirin EC 81 MG tablet Take 81 mg by mouth.  . clonazePAM (KLONOPIN) 1 MG tablet TAKE ONE TABLET BY MOUTH 3 TIMES DAILY AS NEEDED  . donepezil (ARICEPT) 5 MG tablet Take 1 tablet (5 mg total) by mouth at bedtime.  . DULoxetine (CYMBALTA) 30 MG capsule TAKE 1 CAPSULE (30 MG TOTAL) BY MOUTH EVERY EVENING.  Marland Kitchen glucose blood (ONE TOUCH ULTRA TEST) test strip Test 4X a day and prn  Dx 250.02  . insulin aspart (NOVOLOG) 100 UNIT/ML injection Inject 4-16 Units into the skin 3 (three) times daily before meals. Use per sliding scale  . insulin detemir (LEVEMIR) 100 UNIT/ML injection Inject SQ 42 units QAM and 34 units QHS  . levothyroxine (SYNTHROID, LEVOTHROID) 75 MCG tablet TAKE 1 TABLET (75 MCG TOTAL) BY MOUTH DAILY.  Marland Kitchen Loratadine 10 MG CAPS Take by mouth.  . metoprolol (LOPRESSOR) 50 MG tablet Take 50 mg by mouth 2 (two) times daily.  Marland Kitchen nystatin (MYCOSTATIN/NYSTOP) 100000 UNIT/GM POWD USE AS DIRECTED  . potassium chloride (MICRO-K) 10 MEQ CR capsule TAKE 1 CAPSULE (10 MEQ TOTAL) BY MOUTH DAILY.  . pravastatin (PRAVACHOL) 80 MG tablet TAKE 1 TABLET (80 MG TOTAL) BY MOUTH DAILY.  Marland Kitchen Vitamin D, Ergocalciferol, (DRISDOL) 50000 units CAPS capsule  TAKE 1 CAPSULE (50,000 UNITS TOTAL) BY MOUTH EVERY 7 (SEVEN) DAYS.  Marland Kitchen XARELTO 20 MG TABS tablet TAKE 1 TABLET (20 MG TOTAL) BY MOUTH DAILY WITH SUPPER.  . [DISCONTINUED] metoprolol tartrate (LOPRESSOR) 25 MG tablet Take 1 tablet (25 mg total) by mouth daily as needed.  . nitroGLYCERIN (NITROSTAT) 0.4 MG SL tablet USE AS DIRECTED (Patient not taking: Reported on 10/31/2015)  . [DISCONTINUED] cetirizine (ZYRTEC) 10 MG tablet Take 10 mg by mouth daily.  . [DISCONTINUED]  fluticasone (FLONASE) 50 MCG/ACT nasal spray USE 2 SPRAYS INTO THE NOSE DAILY.  . [DISCONTINUED] sotalol (BETAPACE) 80 MG tablet TAKE 1 AND 1/2 TABLET TWICE A DAY BY MOUTH   No facility-administered encounter medications on file as of 10/31/2015.     Review of Systems  Constitutional: Positive for fatigue (sometimes).  HENT: Negative.   Eyes: Negative.   Respiratory: Negative.   Cardiovascular: Negative.   Gastrointestinal: Negative.   Endocrine: Negative.   Genitourinary: Negative.   Musculoskeletal: Negative.   Skin: Negative.   Allergic/Immunologic: Negative.   Neurological: Negative.   Hematological: Negative.   Psychiatric/Behavioral: Negative.        Objective:   Physical Exam  Constitutional: She is oriented to person, place, and time. She appears well-developed and well-nourished. No distress.  HENT:  Head: Normocephalic and atraumatic.  Eyes: Conjunctivae and EOM are normal. Pupils are equal, round, and reactive to light. Right eye exhibits no discharge. Left eye exhibits no discharge. No scleral icterus.  Neck: Normal range of motion. Neck supple.  Cardiovascular: Normal rate, regular rhythm and normal heart sounds.   No murmur heard. The rhythm is regular at 60/m  Pulmonary/Chest: Effort normal and breath sounds normal. No respiratory distress. She has no wheezes. She has no rales. She exhibits no tenderness.  Abdominal: Soft.  Morbid obesity  Musculoskeletal: She exhibits no edema or tenderness.  The patient is in a wheelchair and was examined in the wheelchair.  Neurological: She is alert and oriented to person, place, and time. No cranial nerve deficit.  Skin: Skin is warm and dry. No rash noted.  Psychiatric: She has a normal mood and affect. Her behavior is normal. Judgment and thought content normal.  The patient's mood is always somewhat depressed. She did have a smile when she came in today.  Nursing note and vitals reviewed.  BP 144/59 mmHg  Pulse 68   Temp(Src) 97.5 F (36.4 C) (Oral)  Ht '5\' 4"'$  (1.626 m)  Wt 275 lb (124.739 kg)  BMI 47.18 kg/m2        Assessment & Plan:  1. SVT (supraventricular tachycardia) ([place]) -The patient appears to be doing better since her hospitalization. She is wearing a monitor and has a follow-up appointment scheduled with her cardiologist in May.  2. Morbid obesity due to excess calories ([place]) -We continue to emphasize to her the importance of watching her diet and getting on a better schedule. The son seems very willing to help her with this and has plans to move her closer by for him in the next couple of months. He will get in touch with [place] at that time and we will arrange for home health to visit her and work with him to help her with her diet and medications and more positive way.  3. Hyperlipemia -Continue current treatment and aggressive therapeutic lifestyle changes  4. Essential hypertension -The blood pressure is slightly up today but no changes in treatment are recommended - BMP8+EGFR  5. Type 2 diabetes  mellitus with hyperlipidemia ([place]) -The most recent A1c was 8.6% and the patient knows she needs to do better with her eating habits and kinds of foods that she is eating. The son seems intent on helping her with this. - BMP8+EGFR  Meds ordered this encounter  Medications  . Loratadine 10 MG CAPS    Sig: Take by mouth.  . metoprolol (LOPRESSOR) 50 MG tablet    Sig: Take 50 mg by mouth 2 (two) times daily.  Marland Kitchen aspirin EC 81 MG tablet    Sig: Take 81 mg by mouth.   Patient Instructions  The patient's son will call our office when his mother is moving closer to him and we will set up physical therapy and home health to come in and visit her and work with him to help him with her diet and also to help with her gait strengthening so that she can walk more. The patient's son seems very receptive and willing to help his mother and the patient seems more willing to receive this helps She will  continue to follow-up with the cardiologist at [facility] She will continue to wear the monitor that she is wearing so that he can help get her rhythm regulator and completely She will continue with the treatment medication changes as recommended at the hospital on her recent hospital stay   Arrie Senate MD

## 2015-11-01 LAB — BMP8+EGFR
BUN/Creatinine Ratio: 20 (ref 12–28)
BUN: 12 mg/dL (ref 8–27)
CALCIUM: 8.9 mg/dL (ref 8.7–10.3)
CHLORIDE: 98 mmol/L (ref 96–106)
CO2: 26 mmol/L (ref 18–29)
Creatinine, Ser: 0.6 mg/dL (ref 0.57–1.00)
GFR, EST AFRICAN AMERICAN: 105 mL/min/{1.73_m2} (ref >59)
GFR, EST NON AFRICAN AMERICAN: 91 mL/min/{1.73_m2} (ref >59)
Glucose: 286 mg/dL — ABNORMAL HIGH (ref 65–99)
POTASSIUM: 4.4 mmol/L (ref 3.5–5.2)
SODIUM: 140 mmol/L (ref 134–144)

## 2015-11-01 NOTE — Progress Notes (Signed)
Disregard per Berton LanJamie Bullins

## 2015-11-04 ENCOUNTER — Telehealth: Payer: Self-pay | Admitting: Family Medicine

## 2015-11-05 DIAGNOSIS — I4891 Unspecified atrial fibrillation: Secondary | ICD-10-CM | POA: Diagnosis not present

## 2015-11-05 DIAGNOSIS — J9612 Chronic respiratory failure with hypercapnia: Secondary | ICD-10-CM | POA: Diagnosis not present

## 2015-11-08 NOTE — Telephone Encounter (Signed)
Several attempts have been made to contact patient. This encounter will be closed.  

## 2015-11-20 ENCOUNTER — Other Ambulatory Visit: Payer: Self-pay | Admitting: Family Medicine

## 2015-11-25 DIAGNOSIS — R000 Tachycardia, unspecified: Secondary | ICD-10-CM | POA: Diagnosis not present

## 2015-11-27 ENCOUNTER — Other Ambulatory Visit: Payer: Self-pay | Admitting: Family Medicine

## 2015-11-27 ENCOUNTER — Telehealth: Payer: Self-pay | Admitting: Family Medicine

## 2015-11-27 DIAGNOSIS — I5042 Chronic combined systolic (congestive) and diastolic (congestive) heart failure: Secondary | ICD-10-CM | POA: Diagnosis not present

## 2015-11-27 DIAGNOSIS — I259 Chronic ischemic heart disease, unspecified: Secondary | ICD-10-CM | POA: Diagnosis not present

## 2015-11-27 NOTE — Telephone Encounter (Signed)
What medicine? Monia SabalLisa S said it was lasix. Lasix was filled this morning

## 2015-12-05 DIAGNOSIS — I4891 Unspecified atrial fibrillation: Secondary | ICD-10-CM | POA: Diagnosis not present

## 2015-12-05 DIAGNOSIS — J9612 Chronic respiratory failure with hypercapnia: Secondary | ICD-10-CM | POA: Diagnosis not present

## 2015-12-13 ENCOUNTER — Other Ambulatory Visit: Payer: Self-pay | Admitting: Family Medicine

## 2015-12-16 ENCOUNTER — Telehealth: Payer: Self-pay | Admitting: Family Medicine

## 2015-12-16 DIAGNOSIS — R32 Unspecified urinary incontinence: Secondary | ICD-10-CM

## 2015-12-16 NOTE — Telephone Encounter (Signed)
Patient called and states that she has incontinence and has soaked 4 pads in 1 hour today 6/5 and can not get any sleep.  Is there anything that patient can take or do?

## 2015-12-16 NOTE — Telephone Encounter (Signed)
(  Patient aware of plan)

## 2015-12-16 NOTE — Telephone Encounter (Signed)
I noted that this patient has seen a urologist in the past. We should start with checking a clean catch mid stream urine specimen for infection and get a culture and sensitivity on this. This is to make sure there is no urinary tract infection. If this is negative she needs to be rescheduled to see the urologist.

## 2015-12-17 IMAGING — CR DG CHEST 2V
3 series · 3 of 3 positions shown · non-contrast
Comparison: Chest x-ray of 01/07/2013

CLINICAL DATA: Hypertension

EXAM:
CHEST  2 VIEW

[view not recorded (1 of 3)]
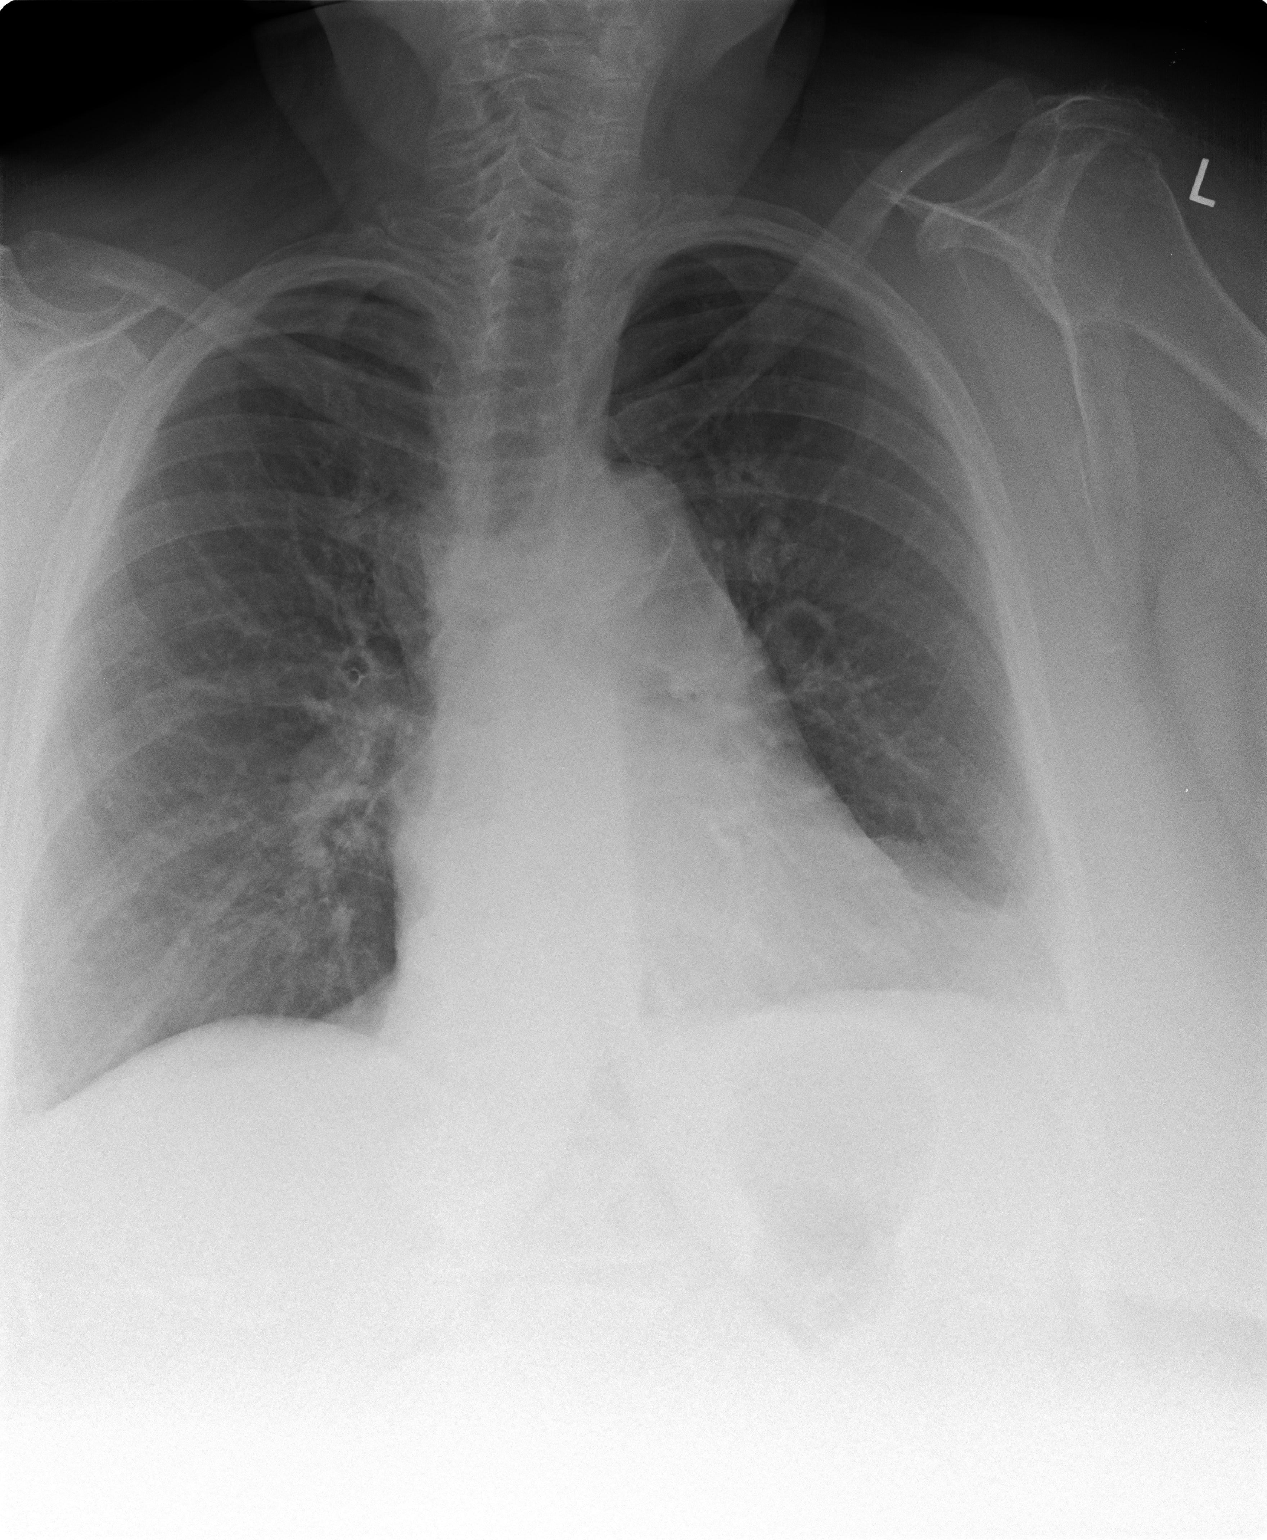

[view not recorded (2 of 3)]
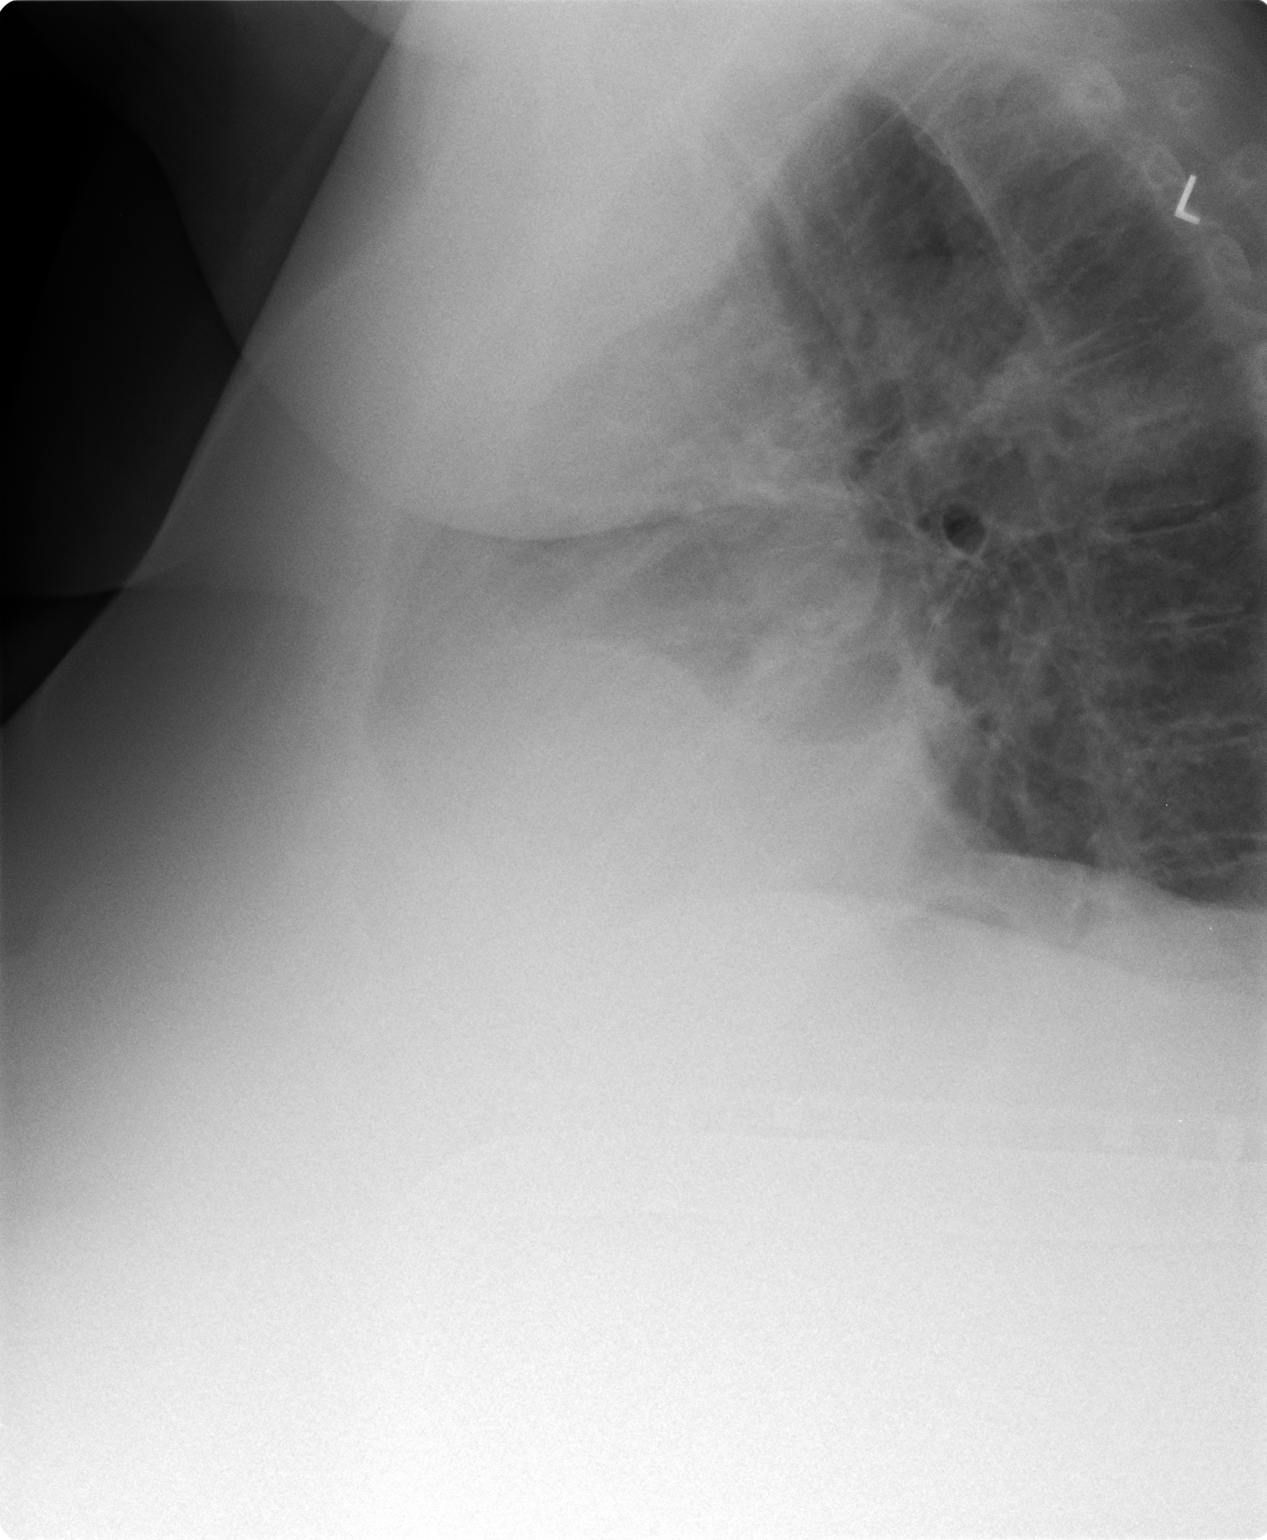

[view not recorded (3 of 3)]
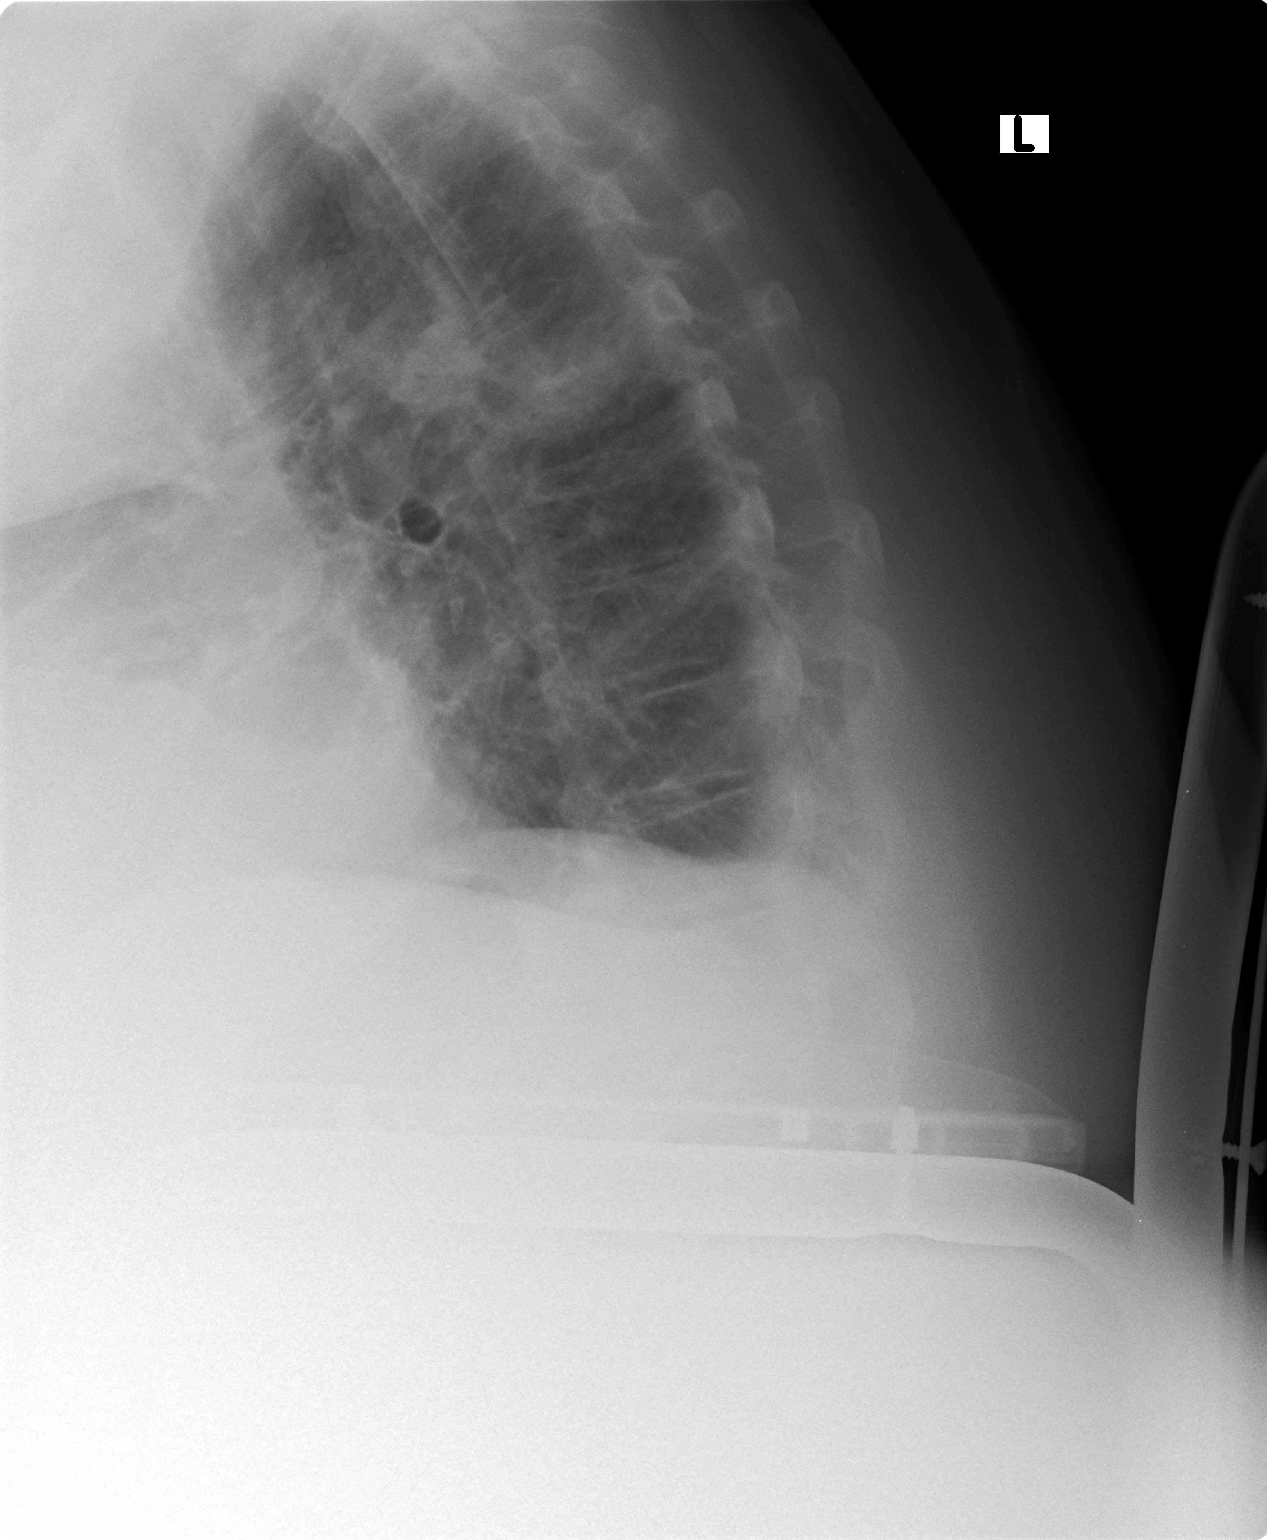

[3 of 3 positions shown; findings below may reference images not displayed]

FINDINGS: No active infiltrate or effusion is seen. Some of the density
questioned previously at the left lung base probably represented
epicardial fat. There is mild peribronchial thickening noted. The
heart is borderline enlarged. No acute bony abnormality is seen.
IMPRESSION: No definite active process. Peribronchial thickening may indicate
bronchitis.

## 2015-12-24 ENCOUNTER — Other Ambulatory Visit: Payer: Self-pay | Admitting: Family Medicine

## 2015-12-25 NOTE — Telephone Encounter (Signed)
Patient last seen in office on 10-31-15. Rx last filled on 08-29-15 for #90 with 1RF. Please advise. If approved please route to Pool A so nurse can call in to pharmacy

## 2015-12-28 DIAGNOSIS — I5042 Chronic combined systolic (congestive) and diastolic (congestive) heart failure: Secondary | ICD-10-CM | POA: Diagnosis not present

## 2015-12-28 DIAGNOSIS — I259 Chronic ischemic heart disease, unspecified: Secondary | ICD-10-CM | POA: Diagnosis not present

## 2015-12-30 ENCOUNTER — Other Ambulatory Visit: Payer: Self-pay | Admitting: Family Medicine

## 2015-12-31 NOTE — Telephone Encounter (Signed)
rx called into pharmacy

## 2015-12-31 NOTE — Telephone Encounter (Signed)
Last filled 10/26/2015

## 2015-12-31 NOTE — Telephone Encounter (Signed)
Last seen 10/31/15 [facility]  If approved route to nurse [facility]

## 2016-01-01 ENCOUNTER — Other Ambulatory Visit: Payer: Self-pay | Admitting: Family Medicine

## 2016-01-01 NOTE — Telephone Encounter (Signed)
Last Vit D 10/03/15  31.3

## 2016-01-05 DIAGNOSIS — I4891 Unspecified atrial fibrillation: Secondary | ICD-10-CM | POA: Diagnosis not present

## 2016-01-05 DIAGNOSIS — J9612 Chronic respiratory failure with hypercapnia: Secondary | ICD-10-CM | POA: Diagnosis not present

## 2016-01-10 DIAGNOSIS — E119 Type 2 diabetes mellitus without complications: Secondary | ICD-10-CM | POA: Diagnosis not present

## 2016-01-10 DIAGNOSIS — Z794 Long term (current) use of insulin: Secondary | ICD-10-CM | POA: Diagnosis not present

## 2016-01-10 DIAGNOSIS — L84 Corns and callosities: Secondary | ICD-10-CM | POA: Diagnosis not present

## 2016-01-10 DIAGNOSIS — E1165 Type 2 diabetes mellitus with hyperglycemia: Secondary | ICD-10-CM | POA: Diagnosis not present

## 2016-01-19 ENCOUNTER — Other Ambulatory Visit: Payer: Self-pay | Admitting: Family Medicine

## 2016-01-20 ENCOUNTER — Other Ambulatory Visit: Payer: Self-pay | Admitting: Family Medicine

## 2016-01-20 MED ORDER — DULOXETINE HCL 30 MG PO CPEP: ORAL_CAPSULE | ORAL | Status: DC

## 2016-01-20 NOTE — Telephone Encounter (Signed)
done

## 2016-01-24 ENCOUNTER — Other Ambulatory Visit: Payer: Self-pay

## 2016-01-24 MED ORDER — DONEPEZIL HCL 5 MG PO TABS: 5.0000 mg | ORAL_TABLET | Freq: Every day | ORAL | Status: DC

## 2016-01-27 DIAGNOSIS — I5042 Chronic combined systolic (congestive) and diastolic (congestive) heart failure: Secondary | ICD-10-CM | POA: Diagnosis not present

## 2016-01-27 DIAGNOSIS — I259 Chronic ischemic heart disease, unspecified: Secondary | ICD-10-CM | POA: Diagnosis not present

## 2016-02-04 DIAGNOSIS — J9612 Chronic respiratory failure with hypercapnia: Secondary | ICD-10-CM | POA: Diagnosis not present

## 2016-02-04 DIAGNOSIS — I4891 Unspecified atrial fibrillation: Secondary | ICD-10-CM | POA: Diagnosis not present

## 2016-02-10 ENCOUNTER — Ambulatory Visit: Payer: Medicare Other | Admitting: Family Medicine

## 2016-02-10 DIAGNOSIS — M5134 Other intervertebral disc degeneration, thoracic region: Secondary | ICD-10-CM | POA: Diagnosis not present

## 2016-02-10 DIAGNOSIS — Z79891 Long term (current) use of opiate analgesic: Secondary | ICD-10-CM | POA: Diagnosis not present

## 2016-02-10 DIAGNOSIS — G894 Chronic pain syndrome: Secondary | ICD-10-CM | POA: Diagnosis not present

## 2016-02-10 DIAGNOSIS — M5136 Other intervertebral disc degeneration, lumbar region: Secondary | ICD-10-CM | POA: Diagnosis not present

## 2016-02-24 ENCOUNTER — Ambulatory Visit: Payer: Medicare Other | Admitting: Family Medicine

## 2016-02-25 ENCOUNTER — Encounter: Payer: Self-pay | Admitting: Family Medicine

## 2016-02-27 ENCOUNTER — Other Ambulatory Visit: Payer: Self-pay | Admitting: Family Medicine

## 2016-02-27 DIAGNOSIS — I259 Chronic ischemic heart disease, unspecified: Secondary | ICD-10-CM | POA: Diagnosis not present

## 2016-02-27 DIAGNOSIS — I5042 Chronic combined systolic (congestive) and diastolic (congestive) heart failure: Secondary | ICD-10-CM | POA: Diagnosis not present

## 2016-02-27 NOTE — Telephone Encounter (Signed)
[facility] pt. Last filled 12/31/15, last seen 10/31/15. Call in

## 2016-02-28 NOTE — Telephone Encounter (Signed)
[facility] 

## 2016-03-06 DIAGNOSIS — J9612 Chronic respiratory failure with hypercapnia: Secondary | ICD-10-CM | POA: Diagnosis not present

## 2016-03-06 DIAGNOSIS — I4891 Unspecified atrial fibrillation: Secondary | ICD-10-CM | POA: Diagnosis not present

## 2016-03-20 ENCOUNTER — Other Ambulatory Visit: Payer: Self-pay | Admitting: Family Medicine

## 2016-03-23 DIAGNOSIS — W19XXXA Unspecified fall, initial encounter: Secondary | ICD-10-CM | POA: Diagnosis not present

## 2016-03-23 DIAGNOSIS — E785 Hyperlipidemia, unspecified: Secondary | ICD-10-CM | POA: Diagnosis not present

## 2016-03-23 DIAGNOSIS — M7731 Calcaneal spur, right foot: Secondary | ICD-10-CM | POA: Diagnosis not present

## 2016-03-23 DIAGNOSIS — R2689 Other abnormalities of gait and mobility: Secondary | ICD-10-CM | POA: Diagnosis not present

## 2016-03-23 DIAGNOSIS — Z91048 Other nonmedicinal substance allergy status: Secondary | ICD-10-CM | POA: Diagnosis not present

## 2016-03-23 DIAGNOSIS — M25461 Effusion, right knee: Secondary | ICD-10-CM | POA: Diagnosis not present

## 2016-03-23 DIAGNOSIS — I480 Paroxysmal atrial fibrillation: Secondary | ICD-10-CM | POA: Diagnosis not present

## 2016-03-23 DIAGNOSIS — G4733 Obstructive sleep apnea (adult) (pediatric): Secondary | ICD-10-CM | POA: Diagnosis not present

## 2016-03-23 DIAGNOSIS — E119 Type 2 diabetes mellitus without complications: Secondary | ICD-10-CM | POA: Diagnosis not present

## 2016-03-23 DIAGNOSIS — R5381 Other malaise: Secondary | ICD-10-CM | POA: Diagnosis not present

## 2016-03-23 DIAGNOSIS — G8929 Other chronic pain: Secondary | ICD-10-CM | POA: Diagnosis not present

## 2016-03-23 DIAGNOSIS — I110 Hypertensive heart disease with heart failure: Secondary | ICD-10-CM | POA: Diagnosis not present

## 2016-03-23 DIAGNOSIS — E039 Hypothyroidism, unspecified: Secondary | ICD-10-CM | POA: Diagnosis not present

## 2016-03-23 DIAGNOSIS — I509 Heart failure, unspecified: Secondary | ICD-10-CM | POA: Diagnosis not present

## 2016-03-23 DIAGNOSIS — Z87891 Personal history of nicotine dependence: Secondary | ICD-10-CM | POA: Diagnosis not present

## 2016-03-23 DIAGNOSIS — M85861 Other specified disorders of bone density and structure, right lower leg: Secondary | ICD-10-CM | POA: Diagnosis not present

## 2016-03-23 DIAGNOSIS — M25462 Effusion, left knee: Secondary | ICD-10-CM | POA: Diagnosis not present

## 2016-03-23 DIAGNOSIS — M25561 Pain in right knee: Secondary | ICD-10-CM | POA: Diagnosis not present

## 2016-03-23 DIAGNOSIS — R262 Difficulty in walking, not elsewhere classified: Secondary | ICD-10-CM | POA: Diagnosis not present

## 2016-03-23 DIAGNOSIS — W1830XA Fall on same level, unspecified, initial encounter: Secondary | ICD-10-CM | POA: Diagnosis not present

## 2016-03-23 DIAGNOSIS — Z79899 Other long term (current) drug therapy: Secondary | ICD-10-CM | POA: Diagnosis not present

## 2016-03-23 DIAGNOSIS — Z7982 Long term (current) use of aspirin: Secondary | ICD-10-CM | POA: Diagnosis not present

## 2016-03-23 DIAGNOSIS — Z7901 Long term (current) use of anticoagulants: Secondary | ICD-10-CM | POA: Diagnosis not present

## 2016-03-23 DIAGNOSIS — Z880 Allergy status to penicillin: Secondary | ICD-10-CM | POA: Diagnosis not present

## 2016-03-23 DIAGNOSIS — M85862 Other specified disorders of bone density and structure, left lower leg: Secondary | ICD-10-CM | POA: Diagnosis not present

## 2016-03-23 DIAGNOSIS — Z7902 Long term (current) use of antithrombotics/antiplatelets: Secondary | ICD-10-CM | POA: Diagnosis not present

## 2016-03-24 ENCOUNTER — Telehealth: Payer: Self-pay | Admitting: Family Medicine

## 2016-03-24 DIAGNOSIS — Z9114 Patient's other noncompliance with medication regimen: Secondary | ICD-10-CM | POA: Diagnosis not present

## 2016-03-24 DIAGNOSIS — M25462 Effusion, left knee: Secondary | ICD-10-CM | POA: Diagnosis not present

## 2016-03-24 DIAGNOSIS — W19XXXA Unspecified fall, initial encounter: Secondary | ICD-10-CM | POA: Diagnosis not present

## 2016-03-24 DIAGNOSIS — R5381 Other malaise: Secondary | ICD-10-CM | POA: Diagnosis not present

## 2016-03-24 DIAGNOSIS — M25561 Pain in right knee: Secondary | ICD-10-CM | POA: Diagnosis not present

## 2016-03-24 DIAGNOSIS — M25461 Effusion, right knee: Secondary | ICD-10-CM | POA: Diagnosis not present

## 2016-03-24 NOTE — Telephone Encounter (Signed)
Please find out who is giving this patient OxyContin?????? and how much as she been taking?????? and for how long??????

## 2016-03-24 NOTE — Telephone Encounter (Signed)
Spoke with pt's son Pt is in hospital from fall Son is concerned about pt's use of Oxycontin Son would like to speak to Dr Christell ConstantMoore regarding this Please advise

## 2016-03-25 DIAGNOSIS — M25561 Pain in right knee: Secondary | ICD-10-CM | POA: Diagnosis not present

## 2016-03-25 DIAGNOSIS — Z794 Long term (current) use of insulin: Secondary | ICD-10-CM | POA: Diagnosis not present

## 2016-03-25 DIAGNOSIS — E119 Type 2 diabetes mellitus without complications: Secondary | ICD-10-CM | POA: Diagnosis not present

## 2016-03-25 DIAGNOSIS — W19XXXA Unspecified fall, initial encounter: Secondary | ICD-10-CM | POA: Diagnosis not present

## 2016-03-25 DIAGNOSIS — E785 Hyperlipidemia, unspecified: Secondary | ICD-10-CM | POA: Diagnosis not present

## 2016-03-25 NOTE — Telephone Encounter (Signed)
Spoke with pt's son regarding medication He will contact prescribing MD

## 2016-03-26 DIAGNOSIS — I5042 Chronic combined systolic (congestive) and diastolic (congestive) heart failure: Secondary | ICD-10-CM | POA: Diagnosis not present

## 2016-03-26 DIAGNOSIS — I10 Essential (primary) hypertension: Secondary | ICD-10-CM | POA: Diagnosis not present

## 2016-03-26 DIAGNOSIS — J9612 Chronic respiratory failure with hypercapnia: Secondary | ICD-10-CM | POA: Diagnosis not present

## 2016-03-26 DIAGNOSIS — M85861 Other specified disorders of bone density and structure, right lower leg: Secondary | ICD-10-CM | POA: Diagnosis not present

## 2016-03-26 DIAGNOSIS — I110 Hypertensive heart disease with heart failure: Secondary | ICD-10-CM | POA: Diagnosis not present

## 2016-03-26 DIAGNOSIS — Z794 Long term (current) use of insulin: Secondary | ICD-10-CM | POA: Diagnosis not present

## 2016-03-26 DIAGNOSIS — E559 Vitamin D deficiency, unspecified: Secondary | ICD-10-CM | POA: Diagnosis not present

## 2016-03-26 DIAGNOSIS — W19XXXS Unspecified fall, sequela: Secondary | ICD-10-CM | POA: Diagnosis not present

## 2016-03-26 DIAGNOSIS — R5381 Other malaise: Secondary | ICD-10-CM | POA: Diagnosis not present

## 2016-03-26 DIAGNOSIS — E119 Type 2 diabetes mellitus without complications: Secondary | ICD-10-CM | POA: Diagnosis not present

## 2016-03-26 DIAGNOSIS — M85862 Other specified disorders of bone density and structure, left lower leg: Secondary | ICD-10-CM | POA: Diagnosis not present

## 2016-03-26 DIAGNOSIS — Z880 Allergy status to penicillin: Secondary | ICD-10-CM | POA: Diagnosis not present

## 2016-03-26 DIAGNOSIS — I509 Heart failure, unspecified: Secondary | ICD-10-CM | POA: Diagnosis not present

## 2016-03-26 DIAGNOSIS — I259 Chronic ischemic heart disease, unspecified: Secondary | ICD-10-CM | POA: Diagnosis not present

## 2016-03-26 DIAGNOSIS — I4891 Unspecified atrial fibrillation: Secondary | ICD-10-CM | POA: Diagnosis not present

## 2016-03-26 DIAGNOSIS — M25561 Pain in right knee: Secondary | ICD-10-CM | POA: Diagnosis not present

## 2016-03-26 DIAGNOSIS — W19XXXA Unspecified fall, initial encounter: Secondary | ICD-10-CM | POA: Diagnosis not present

## 2016-03-26 DIAGNOSIS — E785 Hyperlipidemia, unspecified: Secondary | ICD-10-CM | POA: Diagnosis not present

## 2016-03-26 DIAGNOSIS — M25462 Effusion, left knee: Secondary | ICD-10-CM | POA: Diagnosis not present

## 2016-03-26 DIAGNOSIS — G894 Chronic pain syndrome: Secondary | ICD-10-CM | POA: Diagnosis not present

## 2016-03-26 DIAGNOSIS — I480 Paroxysmal atrial fibrillation: Secondary | ICD-10-CM | POA: Diagnosis not present

## 2016-03-26 DIAGNOSIS — Z7902 Long term (current) use of antithrombotics/antiplatelets: Secondary | ICD-10-CM | POA: Diagnosis not present

## 2016-03-26 DIAGNOSIS — E039 Hypothyroidism, unspecified: Secondary | ICD-10-CM | POA: Diagnosis not present

## 2016-03-26 DIAGNOSIS — Z91048 Other nonmedicinal substance allergy status: Secondary | ICD-10-CM | POA: Diagnosis not present

## 2016-03-26 DIAGNOSIS — Z87891 Personal history of nicotine dependence: Secondary | ICD-10-CM | POA: Diagnosis not present

## 2016-03-26 DIAGNOSIS — J9610 Chronic respiratory failure, unspecified whether with hypoxia or hypercapnia: Secondary | ICD-10-CM | POA: Diagnosis not present

## 2016-03-26 DIAGNOSIS — Z7982 Long term (current) use of aspirin: Secondary | ICD-10-CM | POA: Diagnosis not present

## 2016-03-26 DIAGNOSIS — R262 Difficulty in walking, not elsewhere classified: Secondary | ICD-10-CM | POA: Diagnosis not present

## 2016-03-26 DIAGNOSIS — Z7901 Long term (current) use of anticoagulants: Secondary | ICD-10-CM | POA: Diagnosis not present

## 2016-03-26 DIAGNOSIS — Z7409 Other reduced mobility: Secondary | ICD-10-CM | POA: Diagnosis not present

## 2016-03-26 DIAGNOSIS — G4733 Obstructive sleep apnea (adult) (pediatric): Secondary | ICD-10-CM | POA: Diagnosis not present

## 2016-03-26 DIAGNOSIS — Z9981 Dependence on supplemental oxygen: Secondary | ICD-10-CM | POA: Diagnosis not present

## 2016-03-26 DIAGNOSIS — Z79899 Other long term (current) drug therapy: Secondary | ICD-10-CM | POA: Diagnosis not present

## 2016-03-26 DIAGNOSIS — M6281 Muscle weakness (generalized): Secondary | ICD-10-CM | POA: Diagnosis not present

## 2016-03-26 DIAGNOSIS — R269 Unspecified abnormalities of gait and mobility: Secondary | ICD-10-CM | POA: Diagnosis not present

## 2016-03-26 DIAGNOSIS — D519 Vitamin B12 deficiency anemia, unspecified: Secondary | ICD-10-CM | POA: Diagnosis not present

## 2016-03-26 DIAGNOSIS — M7731 Calcaneal spur, right foot: Secondary | ICD-10-CM | POA: Diagnosis not present

## 2016-03-26 DIAGNOSIS — G8929 Other chronic pain: Secondary | ICD-10-CM | POA: Diagnosis not present

## 2016-03-30 DIAGNOSIS — E039 Hypothyroidism, unspecified: Secondary | ICD-10-CM | POA: Diagnosis not present

## 2016-03-30 DIAGNOSIS — I10 Essential (primary) hypertension: Secondary | ICD-10-CM | POA: Diagnosis not present

## 2016-03-30 DIAGNOSIS — E119 Type 2 diabetes mellitus without complications: Secondary | ICD-10-CM | POA: Diagnosis not present

## 2016-03-30 DIAGNOSIS — I4891 Unspecified atrial fibrillation: Secondary | ICD-10-CM | POA: Diagnosis not present

## 2016-03-30 DIAGNOSIS — E785 Hyperlipidemia, unspecified: Secondary | ICD-10-CM | POA: Diagnosis not present

## 2016-03-31 DIAGNOSIS — I10 Essential (primary) hypertension: Secondary | ICD-10-CM | POA: Diagnosis not present

## 2016-03-31 DIAGNOSIS — W19XXXA Unspecified fall, initial encounter: Secondary | ICD-10-CM | POA: Diagnosis not present

## 2016-03-31 DIAGNOSIS — E785 Hyperlipidemia, unspecified: Secondary | ICD-10-CM | POA: Diagnosis not present

## 2016-03-31 DIAGNOSIS — E119 Type 2 diabetes mellitus without complications: Secondary | ICD-10-CM | POA: Diagnosis not present

## 2016-03-31 DIAGNOSIS — I4891 Unspecified atrial fibrillation: Secondary | ICD-10-CM | POA: Diagnosis not present

## 2016-03-31 NOTE — Progress Notes (Deleted)
Cardiology Office Note   Date:  03/31/2016   ID:  [name], DOB 1940-11-26, MRN [id number]  PCP:  [name], DONALD, MD  Cardiologist:   Rollene RotundaJames Kmarion Rawl, MD   No chief complaint on file.     History of Present Illness: [name] is a 75 y.o. female who presents for atrial fibrillation and diastolic dysfunction. She is morbidly obese and gets around in a wheelchair predominantly though she can transfer from chair to bed. She has chronic oxygen. She has sleep apnea she doesn't use CPAP.   She is on chronic anticoagulation. When I last saw her she had palpitations and I gave her PRN beta blocker. Since I last saw her she ***   has had some palpitations.   She does not have any acute shortness of breath though she's chronically dyspneic. She does have any PND or orthopnea. She has no edema.  [facility]  Told her to take an extra sotalol if she thought she was having fibrillation.   She thinks she's had to do this a couple of times since I saw her.   Past Medical History:  Diagnosis Date  . Allergic rhinitis   . Back pain   . CAD (coronary artery disease)   . Diabetes mellitus    type 2  . Fibromyalgia   . Hyperlipemia   . Hypothyroidism   . Morbid obesity (HCC)   . 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 Narcolepsy   . Sleep apnea   . SVT (supraventricular tachycardia) (HCC)    afib    Past Surgical History:  Procedure Laterality Date  . APPENDECTOMY    . TOTAL ABDOMINAL HYSTERECTOMY    . TUBAL LIGATION       Current Outpatient Prescriptions  Medication Sig Dispense Refill  . aspirin EC 81 MG tablet Take 81 mg by mouth.    . clonazePAM (KLONOPIN) 1 MG tablet TAKE 1 TABLET BY MOUTH 3 TIMES A DAY AS NEEDED 90 tablet 0  . donepezil (ARICEPT) 5 MG tablet TAKE 1 TABLET (5 MG TOTAL) BY MOUTH AT BEDTIME. 30 tablet 1  . DULoxetine (CYMBALTA) 30 MG capsule TAKE 1 CAPSULE (30 MG TOTAL) BY MOUTH EVERY EVENING. 30 capsule 2   . furosemide (LASIX) 40 MG tablet TAKE 1 TABLET BY MOUTH EVERY DAY 30 tablet 5  . glucose blood (ONE TOUCH ULTRA TEST) test strip Test 4X a day and prn  Dx 250.02 100 each 12  . insulin aspart (NOVOLOG) 100 UNIT/ML injection Inject 4-16 Units into the skin 3 (three) times daily before meals. Use per sliding scale 1 vial 0  . insulin detemir (LEVEMIR) 100 UNIT/ML injection Inject SQ 42 units QAM and 34 units QHS 30 mL 1  . levothyroxine (SYNTHROID, LEVOTHROID) 75 MCG tablet TAKE 1 TABLET (75 MCG TOTAL) BY MOUTH DAILY. 90 tablet 3  . Loratadine 10 MG CAPS Take by mouth.    . metoprolol (LOPRESSOR) 50 MG tablet Take 50 mg by mouth 2 (two) times daily.    . nitroGLYCERIN (NITROSTAT) 0.4 MG SL tablet USE AS DIRECTED (Patient not taking: Reported on 10/31/2015) 25 tablet 4  . nystatin (MYCOSTATIN/NYSTOP) 100000 UNIT/GM POWD USE AS DIRECTED 60 g 6  . potassium chloride (MICRO-K) 10 MEQ CR capsule TAKE 1 CAPSULE (10 MEQ TOTAL) BY MOUTH DAILY. 30 capsule 4  . pravastatin (PRAVACHOL) 80 MG tablet TAKE 1 TABLET (80 MG TOTAL) BY MOUTH DAILY.  90 tablet 1  . Vitamin D, Ergocalciferol, (DRISDOL) 50000 units CAPS capsule TAKE 1 CAPSULE (50,000 UNITS TOTAL) BY MOUTH EVERY 7 (SEVEN) DAYS. 12 capsule 0  . XARELTO 20 MG TABS tablet TAKE 1 TABLET (20 MG TOTAL) BY MOUTH DAILY WITH SUPPER. 30 tablet 2   No current facility-administered medications for this visit.     Allergies:   Amitriptyline; Codeine; Crestor [rosuvastatin]; Detrol [tolterodine]; Lipitor [atorvastatin]; Lisinopril; Penicillins; Seroquel [quetiapine fumarate]; and Adhesive  [tape]     ROS:  Please see the history of present illness.   Otherwise, review of systems are positive for ***.   All other systems are reviewed and negative.    PHYSICAL EXAM: VS:  There were no vitals taken for this visit. , BMI There is no height or weight on file to calculate BMI. PHYSICAL EXAM GEN:  No distress NECK:  No jugular venous distention at 90 degrees,  waveform within normal limits, carotid upstroke brisk and symmetric, no bruits, no thyromegaly LYMPHATICS:  No cervical adenopathy LUNGS:  Clear to auscultation bilaterally BACK:  No CVA tenderness CHEST:  Unremarkable HEART:  S1 and S2 within normal limits, no S3, no S4, no clicks, no rubs, no murmurs ABD:  Positive bowel sounds normal in frequency in pitch, no bruits, no rebound, no guarding, unable to assess midline mass or bruit with the patient seated. EXT:  2 plus pulses throughout, no edema, no cyanosis no clubbing SKIN:  No rashes no nodules   EKG:  EKG is *** ordered today. The ekg ordered today demonstrates Sinus rhythm, rate ***, axis within normal limits, intervals within normal limits, no acute ST-T wave changes.   Recent Labs: 05/22/2015: TSH 2.160 10/03/2015: ALT 16; Platelets 239 10/31/2015: BUN 12; Creatinine, Ser 0.60; Potassium 4.4; Sodium 140     Wt Readings from Last 3 Encounters:  10/31/15 275 lb (124.7 kg)  10/03/15 276 lb (125.2 kg)  07/24/15 270 lb (122.5 kg)      Other studies Reviewed: Additional studies/ records that were reviewed today include: *** Review of the above records demonstrates:  ***   ASSESSMENT AND PLAN:  ATRIAL FIB:   She might have had some paroxysms. However, these are brief. I do not want her taking extra sotalol but will give her prescription for when necessary beta blocker should she have any palpitations. She will otherwise remain on the meds as listed including anticoagulation.  DIASTOLIC DYSFUNCTION:  She eats a lot of canned food.   We discussed the high salt content. She's not had any overt volume overload however. She will remain on the meds as listed.   Current medicines are reviewed at length with the patient today.  The patient does not have concerns regarding medicines.  The following changes have been made:  ***  Labs/ tests ordered today include: *** No orders of the defined types were placed in this  encounter.    Disposition:   FU with *** months with me.     Signed, Rollene Rotunda, MD  03/31/2016 9:45 PM    [facility]

## 2016-04-01 ENCOUNTER — Ambulatory Visit: Payer: Medicare Other | Admitting: Cardiology

## 2016-04-07 ENCOUNTER — Ambulatory Visit: Payer: Medicare Other | Admitting: Family Medicine

## 2016-04-13 ENCOUNTER — Other Ambulatory Visit: Payer: Self-pay | Admitting: Family Medicine

## 2016-04-13 DIAGNOSIS — M6281 Muscle weakness (generalized): Secondary | ICD-10-CM | POA: Diagnosis not present

## 2016-04-13 DIAGNOSIS — I10 Essential (primary) hypertension: Secondary | ICD-10-CM | POA: Diagnosis not present

## 2016-04-13 DIAGNOSIS — W19XXXS Unspecified fall, sequela: Secondary | ICD-10-CM | POA: Diagnosis not present

## 2016-04-13 DIAGNOSIS — I4891 Unspecified atrial fibrillation: Secondary | ICD-10-CM | POA: Diagnosis not present

## 2016-04-13 DIAGNOSIS — E119 Type 2 diabetes mellitus without complications: Secondary | ICD-10-CM | POA: Diagnosis not present

## 2016-04-16 DIAGNOSIS — I10 Essential (primary) hypertension: Secondary | ICD-10-CM | POA: Diagnosis not present

## 2016-04-16 DIAGNOSIS — G894 Chronic pain syndrome: Secondary | ICD-10-CM | POA: Diagnosis not present

## 2016-04-16 DIAGNOSIS — R262 Difficulty in walking, not elsewhere classified: Secondary | ICD-10-CM | POA: Diagnosis not present

## 2016-04-16 DIAGNOSIS — I4891 Unspecified atrial fibrillation: Secondary | ICD-10-CM | POA: Diagnosis not present

## 2016-04-16 DIAGNOSIS — M6281 Muscle weakness (generalized): Secondary | ICD-10-CM | POA: Diagnosis not present

## 2016-04-16 DIAGNOSIS — E785 Hyperlipidemia, unspecified: Secondary | ICD-10-CM | POA: Diagnosis not present

## 2016-04-16 DIAGNOSIS — Z7901 Long term (current) use of anticoagulants: Secondary | ICD-10-CM | POA: Diagnosis not present

## 2016-04-16 DIAGNOSIS — Z794 Long term (current) use of insulin: Secondary | ICD-10-CM | POA: Diagnosis not present

## 2016-04-16 DIAGNOSIS — Z7982 Long term (current) use of aspirin: Secondary | ICD-10-CM | POA: Diagnosis not present

## 2016-04-16 DIAGNOSIS — E119 Type 2 diabetes mellitus without complications: Secondary | ICD-10-CM | POA: Diagnosis not present

## 2016-04-17 ENCOUNTER — Telehealth: Payer: Self-pay | Admitting: Family Medicine

## 2016-04-17 DIAGNOSIS — I509 Heart failure, unspecified: Secondary | ICD-10-CM | POA: Diagnosis not present

## 2016-04-17 DIAGNOSIS — I252 Old myocardial infarction: Secondary | ICD-10-CM | POA: Diagnosis not present

## 2016-04-17 DIAGNOSIS — M79605 Pain in left leg: Secondary | ICD-10-CM | POA: Diagnosis not present

## 2016-04-17 DIAGNOSIS — I471 Supraventricular tachycardia: Secondary | ICD-10-CM | POA: Diagnosis not present

## 2016-04-17 DIAGNOSIS — M79604 Pain in right leg: Secondary | ICD-10-CM | POA: Diagnosis not present

## 2016-04-17 DIAGNOSIS — Z880 Allergy status to penicillin: Secondary | ICD-10-CM | POA: Diagnosis not present

## 2016-04-17 DIAGNOSIS — Z794 Long term (current) use of insulin: Secondary | ICD-10-CM | POA: Diagnosis not present

## 2016-04-17 DIAGNOSIS — R5383 Other fatigue: Secondary | ICD-10-CM | POA: Diagnosis not present

## 2016-04-17 DIAGNOSIS — Z9109 Other allergy status, other than to drugs and biological substances: Secondary | ICD-10-CM | POA: Diagnosis not present

## 2016-04-17 DIAGNOSIS — Z87891 Personal history of nicotine dependence: Secondary | ICD-10-CM | POA: Diagnosis not present

## 2016-04-17 DIAGNOSIS — R627 Adult failure to thrive: Secondary | ICD-10-CM | POA: Diagnosis not present

## 2016-04-17 DIAGNOSIS — R9431 Abnormal electrocardiogram [ECG] [EKG]: Secondary | ICD-10-CM | POA: Diagnosis not present

## 2016-04-17 DIAGNOSIS — R269 Unspecified abnormalities of gait and mobility: Secondary | ICD-10-CM | POA: Diagnosis not present

## 2016-04-17 DIAGNOSIS — E119 Type 2 diabetes mellitus without complications: Secondary | ICD-10-CM | POA: Diagnosis not present

## 2016-04-17 DIAGNOSIS — Z9049 Acquired absence of other specified parts of digestive tract: Secondary | ICD-10-CM | POA: Diagnosis not present

## 2016-04-17 DIAGNOSIS — E785 Hyperlipidemia, unspecified: Secondary | ICD-10-CM | POA: Diagnosis not present

## 2016-04-17 DIAGNOSIS — I10 Essential (primary) hypertension: Secondary | ICD-10-CM | POA: Diagnosis not present

## 2016-04-17 DIAGNOSIS — Z885 Allergy status to narcotic agent status: Secondary | ICD-10-CM | POA: Diagnosis not present

## 2016-04-17 DIAGNOSIS — I2510 Atherosclerotic heart disease of native coronary artery without angina pectoris: Secondary | ICD-10-CM | POA: Diagnosis not present

## 2016-04-17 DIAGNOSIS — Z7982 Long term (current) use of aspirin: Secondary | ICD-10-CM | POA: Diagnosis not present

## 2016-04-17 DIAGNOSIS — R531 Weakness: Secondary | ICD-10-CM | POA: Diagnosis not present

## 2016-04-17 DIAGNOSIS — I4891 Unspecified atrial fibrillation: Secondary | ICD-10-CM | POA: Diagnosis not present

## 2016-04-17 DIAGNOSIS — D509 Iron deficiency anemia, unspecified: Secondary | ICD-10-CM | POA: Diagnosis not present

## 2016-04-17 DIAGNOSIS — J9611 Chronic respiratory failure with hypoxia: Secondary | ICD-10-CM | POA: Diagnosis not present

## 2016-04-17 DIAGNOSIS — I480 Paroxysmal atrial fibrillation: Secondary | ICD-10-CM | POA: Diagnosis not present

## 2016-04-17 DIAGNOSIS — I110 Hypertensive heart disease with heart failure: Secondary | ICD-10-CM | POA: Diagnosis not present

## 2016-04-17 DIAGNOSIS — E039 Hypothyroidism, unspecified: Secondary | ICD-10-CM | POA: Diagnosis not present

## 2016-04-17 DIAGNOSIS — I498 Other specified cardiac arrhythmias: Secondary | ICD-10-CM | POA: Diagnosis not present

## 2016-04-17 NOTE — Telephone Encounter (Signed)
Son is wanting to see about getting mother placed in long term care facility can you help please.

## 2016-04-18 DIAGNOSIS — M79605 Pain in left leg: Secondary | ICD-10-CM | POA: Diagnosis not present

## 2016-04-18 DIAGNOSIS — M79604 Pain in right leg: Secondary | ICD-10-CM | POA: Diagnosis not present

## 2016-04-18 DIAGNOSIS — J9611 Chronic respiratory failure with hypoxia: Secondary | ICD-10-CM | POA: Diagnosis not present

## 2016-04-19 DIAGNOSIS — J9611 Chronic respiratory failure with hypoxia: Secondary | ICD-10-CM | POA: Diagnosis not present

## 2016-04-19 DIAGNOSIS — M79605 Pain in left leg: Secondary | ICD-10-CM | POA: Diagnosis not present

## 2016-04-19 DIAGNOSIS — M79604 Pain in right leg: Secondary | ICD-10-CM | POA: Diagnosis not present

## 2016-04-20 DIAGNOSIS — D509 Iron deficiency anemia, unspecified: Secondary | ICD-10-CM | POA: Diagnosis not present

## 2016-04-20 DIAGNOSIS — R079 Chest pain, unspecified: Secondary | ICD-10-CM | POA: Diagnosis not present

## 2016-04-20 DIAGNOSIS — Z7982 Long term (current) use of aspirin: Secondary | ICD-10-CM | POA: Diagnosis not present

## 2016-04-20 DIAGNOSIS — R279 Unspecified lack of coordination: Secondary | ICD-10-CM | POA: Diagnosis not present

## 2016-04-20 DIAGNOSIS — I4891 Unspecified atrial fibrillation: Secondary | ICD-10-CM | POA: Diagnosis not present

## 2016-04-20 DIAGNOSIS — Z885 Allergy status to narcotic agent status: Secondary | ICD-10-CM | POA: Diagnosis not present

## 2016-04-20 DIAGNOSIS — I10 Essential (primary) hypertension: Secondary | ICD-10-CM | POA: Diagnosis not present

## 2016-04-20 DIAGNOSIS — E039 Hypothyroidism, unspecified: Secondary | ICD-10-CM | POA: Diagnosis not present

## 2016-04-20 DIAGNOSIS — Z9119 Patient's noncompliance with other medical treatment and regimen: Secondary | ICD-10-CM | POA: Diagnosis not present

## 2016-04-20 DIAGNOSIS — I259 Chronic ischemic heart disease, unspecified: Secondary | ICD-10-CM | POA: Diagnosis not present

## 2016-04-20 DIAGNOSIS — R0789 Other chest pain: Secondary | ICD-10-CM | POA: Diagnosis not present

## 2016-04-20 DIAGNOSIS — R627 Adult failure to thrive: Secondary | ICD-10-CM | POA: Diagnosis not present

## 2016-04-20 DIAGNOSIS — K5900 Constipation, unspecified: Secondary | ICD-10-CM | POA: Diagnosis not present

## 2016-04-20 DIAGNOSIS — J9611 Chronic respiratory failure with hypoxia: Secondary | ICD-10-CM | POA: Diagnosis not present

## 2016-04-20 DIAGNOSIS — R2689 Other abnormalities of gait and mobility: Secondary | ICD-10-CM | POA: Diagnosis not present

## 2016-04-20 DIAGNOSIS — J9612 Chronic respiratory failure with hypercapnia: Secondary | ICD-10-CM | POA: Diagnosis not present

## 2016-04-20 DIAGNOSIS — Z7902 Long term (current) use of antithrombotics/antiplatelets: Secondary | ICD-10-CM | POA: Diagnosis not present

## 2016-04-20 DIAGNOSIS — I2510 Atherosclerotic heart disease of native coronary artery without angina pectoris: Secondary | ICD-10-CM | POA: Diagnosis not present

## 2016-04-20 DIAGNOSIS — I480 Paroxysmal atrial fibrillation: Secondary | ICD-10-CM | POA: Diagnosis not present

## 2016-04-20 DIAGNOSIS — I110 Hypertensive heart disease with heart failure: Secondary | ICD-10-CM | POA: Diagnosis not present

## 2016-04-20 DIAGNOSIS — Z880 Allergy status to penicillin: Secondary | ICD-10-CM | POA: Diagnosis not present

## 2016-04-20 DIAGNOSIS — Z7901 Long term (current) use of anticoagulants: Secondary | ICD-10-CM | POA: Diagnosis not present

## 2016-04-20 DIAGNOSIS — I471 Supraventricular tachycardia: Secondary | ICD-10-CM | POA: Diagnosis not present

## 2016-04-20 DIAGNOSIS — M6281 Muscle weakness (generalized): Secondary | ICD-10-CM | POA: Diagnosis not present

## 2016-04-20 DIAGNOSIS — N390 Urinary tract infection, site not specified: Secondary | ICD-10-CM | POA: Diagnosis not present

## 2016-04-20 DIAGNOSIS — Z9109 Other allergy status, other than to drugs and biological substances: Secondary | ICD-10-CM | POA: Diagnosis not present

## 2016-04-20 DIAGNOSIS — R262 Difficulty in walking, not elsewhere classified: Secondary | ICD-10-CM | POA: Diagnosis not present

## 2016-04-20 DIAGNOSIS — E119 Type 2 diabetes mellitus without complications: Secondary | ICD-10-CM | POA: Diagnosis not present

## 2016-04-20 DIAGNOSIS — E118 Type 2 diabetes mellitus with unspecified complications: Secondary | ICD-10-CM | POA: Diagnosis not present

## 2016-04-20 DIAGNOSIS — Z87891 Personal history of nicotine dependence: Secondary | ICD-10-CM | POA: Diagnosis not present

## 2016-04-20 DIAGNOSIS — E785 Hyperlipidemia, unspecified: Secondary | ICD-10-CM | POA: Diagnosis not present

## 2016-04-20 DIAGNOSIS — Z9181 History of falling: Secondary | ICD-10-CM | POA: Diagnosis not present

## 2016-04-20 DIAGNOSIS — G4733 Obstructive sleep apnea (adult) (pediatric): Secondary | ICD-10-CM | POA: Diagnosis not present

## 2016-04-20 DIAGNOSIS — I5042 Chronic combined systolic (congestive) and diastolic (congestive) heart failure: Secondary | ICD-10-CM | POA: Diagnosis not present

## 2016-04-20 DIAGNOSIS — I509 Heart failure, unspecified: Secondary | ICD-10-CM | POA: Diagnosis not present

## 2016-04-20 DIAGNOSIS — M170 Bilateral primary osteoarthritis of knee: Secondary | ICD-10-CM | POA: Diagnosis not present

## 2016-04-20 DIAGNOSIS — J9610 Chronic respiratory failure, unspecified whether with hypoxia or hypercapnia: Secondary | ICD-10-CM | POA: Diagnosis not present

## 2016-04-20 DIAGNOSIS — R269 Unspecified abnormalities of gait and mobility: Secondary | ICD-10-CM | POA: Diagnosis not present

## 2016-04-20 DIAGNOSIS — R1084 Generalized abdominal pain: Secondary | ICD-10-CM | POA: Diagnosis not present

## 2016-04-20 DIAGNOSIS — E559 Vitamin D deficiency, unspecified: Secondary | ICD-10-CM | POA: Diagnosis not present

## 2016-04-20 DIAGNOSIS — G894 Chronic pain syndrome: Secondary | ICD-10-CM | POA: Diagnosis not present

## 2016-04-20 DIAGNOSIS — Z794 Long term (current) use of insulin: Secondary | ICD-10-CM | POA: Diagnosis not present

## 2016-04-20 DIAGNOSIS — I252 Old myocardial infarction: Secondary | ICD-10-CM | POA: Diagnosis not present

## 2016-04-20 DIAGNOSIS — Z9049 Acquired absence of other specified parts of digestive tract: Secondary | ICD-10-CM | POA: Diagnosis not present

## 2016-04-20 DIAGNOSIS — M79605 Pain in left leg: Secondary | ICD-10-CM | POA: Diagnosis not present

## 2016-04-20 DIAGNOSIS — M79604 Pain in right leg: Secondary | ICD-10-CM | POA: Diagnosis not present

## 2016-04-20 DIAGNOSIS — B9620 Unspecified Escherichia coli [E. coli] as the cause of diseases classified elsewhere: Secondary | ICD-10-CM | POA: Diagnosis not present

## 2016-04-21 ENCOUNTER — Telehealth: Payer: Self-pay | Admitting: Family Medicine

## 2016-04-21 DIAGNOSIS — G894 Chronic pain syndrome: Secondary | ICD-10-CM | POA: Diagnosis not present

## 2016-04-21 DIAGNOSIS — I2510 Atherosclerotic heart disease of native coronary artery without angina pectoris: Secondary | ICD-10-CM | POA: Diagnosis not present

## 2016-04-21 DIAGNOSIS — M6281 Muscle weakness (generalized): Secondary | ICD-10-CM | POA: Diagnosis not present

## 2016-04-21 DIAGNOSIS — I10 Essential (primary) hypertension: Secondary | ICD-10-CM | POA: Diagnosis not present

## 2016-04-21 DIAGNOSIS — E119 Type 2 diabetes mellitus without complications: Secondary | ICD-10-CM | POA: Diagnosis not present

## 2016-04-21 NOTE — Telephone Encounter (Signed)
Called to inform that pt has appt 05/06/2016 with [facility] If they need appt sooner, they will call back to schedule

## 2016-04-21 NOTE — Telephone Encounter (Signed)
ALREADY IN A LONG TERM CARE FACILITY AS OF 010/06/17

## 2016-04-23 DIAGNOSIS — N390 Urinary tract infection, site not specified: Secondary | ICD-10-CM | POA: Diagnosis not present

## 2016-04-26 ENCOUNTER — Other Ambulatory Visit: Payer: Self-pay | Admitting: Family Medicine

## 2016-04-28 DIAGNOSIS — E119 Type 2 diabetes mellitus without complications: Secondary | ICD-10-CM | POA: Diagnosis not present

## 2016-04-28 DIAGNOSIS — I10 Essential (primary) hypertension: Secondary | ICD-10-CM | POA: Diagnosis not present

## 2016-04-28 DIAGNOSIS — I2510 Atherosclerotic heart disease of native coronary artery without angina pectoris: Secondary | ICD-10-CM | POA: Diagnosis not present

## 2016-04-28 DIAGNOSIS — G894 Chronic pain syndrome: Secondary | ICD-10-CM | POA: Diagnosis not present

## 2016-04-28 DIAGNOSIS — M6281 Muscle weakness (generalized): Secondary | ICD-10-CM | POA: Diagnosis not present

## 2016-04-30 DIAGNOSIS — N390 Urinary tract infection, site not specified: Secondary | ICD-10-CM | POA: Diagnosis not present

## 2016-04-30 DIAGNOSIS — Z9119 Patient's noncompliance with other medical treatment and regimen: Secondary | ICD-10-CM | POA: Diagnosis not present

## 2016-05-05 DIAGNOSIS — M6281 Muscle weakness (generalized): Secondary | ICD-10-CM | POA: Diagnosis not present

## 2016-05-05 DIAGNOSIS — E119 Type 2 diabetes mellitus without complications: Secondary | ICD-10-CM | POA: Diagnosis not present

## 2016-05-05 DIAGNOSIS — I2510 Atherosclerotic heart disease of native coronary artery without angina pectoris: Secondary | ICD-10-CM | POA: Diagnosis not present

## 2016-05-05 DIAGNOSIS — G894 Chronic pain syndrome: Secondary | ICD-10-CM | POA: Diagnosis not present

## 2016-05-05 DIAGNOSIS — I10 Essential (primary) hypertension: Secondary | ICD-10-CM | POA: Diagnosis not present

## 2016-05-06 ENCOUNTER — Ambulatory Visit: Payer: Medicare Other | Admitting: Family Medicine

## 2016-05-11 DIAGNOSIS — G894 Chronic pain syndrome: Secondary | ICD-10-CM | POA: Diagnosis not present

## 2016-05-13 ENCOUNTER — Other Ambulatory Visit: Payer: Self-pay

## 2016-05-13 MED ORDER — POTASSIUM CHLORIDE ER 10 MEQ PO CPCR: ORAL_CAPSULE | ORAL | 0 refills | Status: DC

## 2016-05-14 DIAGNOSIS — E118 Type 2 diabetes mellitus with unspecified complications: Secondary | ICD-10-CM | POA: Diagnosis not present

## 2016-05-14 DIAGNOSIS — R1084 Generalized abdominal pain: Secondary | ICD-10-CM | POA: Diagnosis not present

## 2016-05-15 ENCOUNTER — Other Ambulatory Visit: Payer: Self-pay

## 2016-05-15 ENCOUNTER — Telehealth: Payer: Self-pay

## 2016-05-15 MED ORDER — DULOXETINE HCL 30 MG PO CPEP: ORAL_CAPSULE | ORAL | 0 refills | Status: DC

## 2016-05-15 NOTE — Telephone Encounter (Signed)
Patient needs refill of Cymbalta 30 mg, last seen July 2017, please advise.

## 2016-05-15 NOTE — Telephone Encounter (Signed)
This is okay to refill 1 

## 2016-05-15 NOTE — Telephone Encounter (Signed)
[facility]

## 2016-05-18 DIAGNOSIS — I10 Essential (primary) hypertension: Secondary | ICD-10-CM | POA: Diagnosis not present

## 2016-05-18 DIAGNOSIS — E119 Type 2 diabetes mellitus without complications: Secondary | ICD-10-CM | POA: Diagnosis not present

## 2016-05-18 DIAGNOSIS — M6281 Muscle weakness (generalized): Secondary | ICD-10-CM | POA: Diagnosis not present

## 2016-05-18 DIAGNOSIS — G894 Chronic pain syndrome: Secondary | ICD-10-CM | POA: Diagnosis not present

## 2016-05-18 DIAGNOSIS — I2510 Atherosclerotic heart disease of native coronary artery without angina pectoris: Secondary | ICD-10-CM | POA: Diagnosis not present

## 2016-05-21 DIAGNOSIS — I480 Paroxysmal atrial fibrillation: Secondary | ICD-10-CM | POA: Diagnosis not present

## 2016-05-21 DIAGNOSIS — E119 Type 2 diabetes mellitus without complications: Secondary | ICD-10-CM | POA: Diagnosis not present

## 2016-05-21 DIAGNOSIS — Z9181 History of falling: Secondary | ICD-10-CM | POA: Diagnosis not present

## 2016-05-21 DIAGNOSIS — I2510 Atherosclerotic heart disease of native coronary artery without angina pectoris: Secondary | ICD-10-CM | POA: Diagnosis not present

## 2016-05-21 DIAGNOSIS — M6281 Muscle weakness (generalized): Secondary | ICD-10-CM | POA: Diagnosis not present

## 2016-05-21 DIAGNOSIS — R0789 Other chest pain: Secondary | ICD-10-CM | POA: Diagnosis not present

## 2016-05-21 DIAGNOSIS — R079 Chest pain, unspecified: Secondary | ICD-10-CM | POA: Diagnosis not present

## 2016-05-21 DIAGNOSIS — I110 Hypertensive heart disease with heart failure: Secondary | ICD-10-CM | POA: Diagnosis not present

## 2016-05-21 DIAGNOSIS — J9611 Chronic respiratory failure with hypoxia: Secondary | ICD-10-CM | POA: Diagnosis not present

## 2016-05-21 DIAGNOSIS — G4733 Obstructive sleep apnea (adult) (pediatric): Secondary | ICD-10-CM | POA: Diagnosis not present

## 2016-05-21 DIAGNOSIS — I509 Heart failure, unspecified: Secondary | ICD-10-CM | POA: Diagnosis not present

## 2016-05-21 DIAGNOSIS — E785 Hyperlipidemia, unspecified: Secondary | ICD-10-CM | POA: Diagnosis not present

## 2016-05-21 DIAGNOSIS — Z885 Allergy status to narcotic agent status: Secondary | ICD-10-CM | POA: Diagnosis not present

## 2016-05-21 DIAGNOSIS — Z880 Allergy status to penicillin: Secondary | ICD-10-CM | POA: Diagnosis not present

## 2016-05-21 DIAGNOSIS — Z7982 Long term (current) use of aspirin: Secondary | ICD-10-CM | POA: Diagnosis not present

## 2016-05-21 DIAGNOSIS — Z87891 Personal history of nicotine dependence: Secondary | ICD-10-CM | POA: Diagnosis not present

## 2016-05-21 DIAGNOSIS — Z7901 Long term (current) use of anticoagulants: Secondary | ICD-10-CM | POA: Diagnosis not present

## 2016-05-21 DIAGNOSIS — B9620 Unspecified Escherichia coli [E. coli] as the cause of diseases classified elsewhere: Secondary | ICD-10-CM | POA: Diagnosis not present

## 2016-05-21 DIAGNOSIS — G894 Chronic pain syndrome: Secondary | ICD-10-CM | POA: Diagnosis not present

## 2016-05-21 DIAGNOSIS — I10 Essential (primary) hypertension: Secondary | ICD-10-CM | POA: Diagnosis not present

## 2016-05-21 DIAGNOSIS — E039 Hypothyroidism, unspecified: Secondary | ICD-10-CM | POA: Diagnosis not present

## 2016-05-21 DIAGNOSIS — Z7902 Long term (current) use of antithrombotics/antiplatelets: Secondary | ICD-10-CM | POA: Diagnosis not present

## 2016-05-21 DIAGNOSIS — N390 Urinary tract infection, site not specified: Secondary | ICD-10-CM | POA: Diagnosis not present

## 2016-05-21 DIAGNOSIS — K5900 Constipation, unspecified: Secondary | ICD-10-CM | POA: Diagnosis not present

## 2016-05-21 DIAGNOSIS — Z9049 Acquired absence of other specified parts of digestive tract: Secondary | ICD-10-CM | POA: Diagnosis not present

## 2016-05-22 DIAGNOSIS — I2510 Atherosclerotic heart disease of native coronary artery without angina pectoris: Secondary | ICD-10-CM | POA: Diagnosis not present

## 2016-05-22 DIAGNOSIS — R079 Chest pain, unspecified: Secondary | ICD-10-CM | POA: Diagnosis not present

## 2016-05-22 DIAGNOSIS — Z7982 Long term (current) use of aspirin: Secondary | ICD-10-CM | POA: Diagnosis not present

## 2016-05-22 DIAGNOSIS — I480 Paroxysmal atrial fibrillation: Secondary | ICD-10-CM | POA: Diagnosis not present

## 2016-05-22 DIAGNOSIS — I471 Supraventricular tachycardia: Secondary | ICD-10-CM | POA: Diagnosis not present

## 2016-05-22 DIAGNOSIS — R0789 Other chest pain: Secondary | ICD-10-CM | POA: Diagnosis not present

## 2016-05-22 DIAGNOSIS — R9431 Abnormal electrocardiogram [ECG] [EKG]: Secondary | ICD-10-CM | POA: Diagnosis not present

## 2016-05-23 DIAGNOSIS — I480 Paroxysmal atrial fibrillation: Secondary | ICD-10-CM | POA: Diagnosis not present

## 2016-05-23 DIAGNOSIS — J9612 Chronic respiratory failure with hypercapnia: Secondary | ICD-10-CM | POA: Diagnosis not present

## 2016-05-23 DIAGNOSIS — E559 Vitamin D deficiency, unspecified: Secondary | ICD-10-CM | POA: Diagnosis not present

## 2016-05-23 DIAGNOSIS — G4733 Obstructive sleep apnea (adult) (pediatric): Secondary | ICD-10-CM | POA: Diagnosis not present

## 2016-05-23 DIAGNOSIS — I259 Chronic ischemic heart disease, unspecified: Secondary | ICD-10-CM | POA: Diagnosis not present

## 2016-05-23 DIAGNOSIS — R079 Chest pain, unspecified: Secondary | ICD-10-CM | POA: Diagnosis not present

## 2016-05-23 DIAGNOSIS — M170 Bilateral primary osteoarthritis of knee: Secondary | ICD-10-CM | POA: Diagnosis not present

## 2016-05-23 DIAGNOSIS — R269 Unspecified abnormalities of gait and mobility: Secondary | ICD-10-CM | POA: Diagnosis not present

## 2016-05-23 DIAGNOSIS — Z9181 History of falling: Secondary | ICD-10-CM | POA: Diagnosis not present

## 2016-05-23 DIAGNOSIS — R262 Difficulty in walking, not elsewhere classified: Secondary | ICD-10-CM | POA: Diagnosis not present

## 2016-05-23 DIAGNOSIS — Z87891 Personal history of nicotine dependence: Secondary | ICD-10-CM | POA: Diagnosis not present

## 2016-05-23 DIAGNOSIS — J9611 Chronic respiratory failure with hypoxia: Secondary | ICD-10-CM | POA: Diagnosis not present

## 2016-05-23 DIAGNOSIS — Z7901 Long term (current) use of anticoagulants: Secondary | ICD-10-CM | POA: Diagnosis not present

## 2016-05-23 DIAGNOSIS — Z794 Long term (current) use of insulin: Secondary | ICD-10-CM | POA: Diagnosis not present

## 2016-05-23 DIAGNOSIS — Z9049 Acquired absence of other specified parts of digestive tract: Secondary | ICD-10-CM | POA: Diagnosis not present

## 2016-05-23 DIAGNOSIS — Z7982 Long term (current) use of aspirin: Secondary | ICD-10-CM | POA: Diagnosis not present

## 2016-05-23 DIAGNOSIS — G894 Chronic pain syndrome: Secondary | ICD-10-CM | POA: Diagnosis not present

## 2016-05-23 DIAGNOSIS — Z885 Allergy status to narcotic agent status: Secondary | ICD-10-CM | POA: Diagnosis not present

## 2016-05-23 DIAGNOSIS — D509 Iron deficiency anemia, unspecified: Secondary | ICD-10-CM | POA: Diagnosis not present

## 2016-05-23 DIAGNOSIS — B9620 Unspecified Escherichia coli [E. coli] as the cause of diseases classified elsewhere: Secondary | ICD-10-CM | POA: Diagnosis not present

## 2016-05-23 DIAGNOSIS — R0789 Other chest pain: Secondary | ICD-10-CM | POA: Diagnosis not present

## 2016-05-23 DIAGNOSIS — Z880 Allergy status to penicillin: Secondary | ICD-10-CM | POA: Diagnosis not present

## 2016-05-23 DIAGNOSIS — J9610 Chronic respiratory failure, unspecified whether with hypoxia or hypercapnia: Secondary | ICD-10-CM | POA: Diagnosis not present

## 2016-05-23 DIAGNOSIS — I4891 Unspecified atrial fibrillation: Secondary | ICD-10-CM | POA: Diagnosis not present

## 2016-05-23 DIAGNOSIS — E113293 Type 2 diabetes mellitus with mild nonproliferative diabetic retinopathy without macular edema, bilateral: Secondary | ICD-10-CM | POA: Diagnosis not present

## 2016-05-23 DIAGNOSIS — E119 Type 2 diabetes mellitus without complications: Secondary | ICD-10-CM | POA: Diagnosis not present

## 2016-05-23 DIAGNOSIS — I509 Heart failure, unspecified: Secondary | ICD-10-CM | POA: Diagnosis not present

## 2016-05-23 DIAGNOSIS — Z961 Presence of intraocular lens: Secondary | ICD-10-CM | POA: Diagnosis not present

## 2016-05-23 DIAGNOSIS — G4761 Periodic limb movement disorder: Secondary | ICD-10-CM | POA: Diagnosis not present

## 2016-05-23 DIAGNOSIS — M6281 Muscle weakness (generalized): Secondary | ICD-10-CM | POA: Diagnosis not present

## 2016-05-23 DIAGNOSIS — I2510 Atherosclerotic heart disease of native coronary artery without angina pectoris: Secondary | ICD-10-CM | POA: Diagnosis not present

## 2016-05-23 DIAGNOSIS — I10 Essential (primary) hypertension: Secondary | ICD-10-CM | POA: Diagnosis not present

## 2016-05-23 DIAGNOSIS — R21 Rash and other nonspecific skin eruption: Secondary | ICD-10-CM | POA: Diagnosis not present

## 2016-05-23 DIAGNOSIS — N390 Urinary tract infection, site not specified: Secondary | ICD-10-CM | POA: Diagnosis not present

## 2016-05-23 DIAGNOSIS — E039 Hypothyroidism, unspecified: Secondary | ICD-10-CM | POA: Diagnosis not present

## 2016-05-23 DIAGNOSIS — R9431 Abnormal electrocardiogram [ECG] [EKG]: Secondary | ICD-10-CM | POA: Diagnosis not present

## 2016-05-23 DIAGNOSIS — E785 Hyperlipidemia, unspecified: Secondary | ICD-10-CM | POA: Diagnosis not present

## 2016-05-23 DIAGNOSIS — R278 Other lack of coordination: Secondary | ICD-10-CM | POA: Diagnosis not present

## 2016-05-23 DIAGNOSIS — R05 Cough: Secondary | ICD-10-CM | POA: Diagnosis not present

## 2016-05-23 DIAGNOSIS — I5042 Chronic combined systolic (congestive) and diastolic (congestive) heart failure: Secondary | ICD-10-CM | POA: Diagnosis not present

## 2016-05-23 DIAGNOSIS — R279 Unspecified lack of coordination: Secondary | ICD-10-CM | POA: Diagnosis not present

## 2016-05-23 DIAGNOSIS — K5900 Constipation, unspecified: Secondary | ICD-10-CM | POA: Diagnosis not present

## 2016-05-23 DIAGNOSIS — I110 Hypertensive heart disease with heart failure: Secondary | ICD-10-CM | POA: Diagnosis not present

## 2016-05-23 DIAGNOSIS — I471 Supraventricular tachycardia: Secondary | ICD-10-CM | POA: Diagnosis not present

## 2016-05-25 DIAGNOSIS — M6281 Muscle weakness (generalized): Secondary | ICD-10-CM | POA: Diagnosis not present

## 2016-05-25 DIAGNOSIS — I10 Essential (primary) hypertension: Secondary | ICD-10-CM | POA: Diagnosis not present

## 2016-05-25 DIAGNOSIS — E119 Type 2 diabetes mellitus without complications: Secondary | ICD-10-CM | POA: Diagnosis not present

## 2016-05-25 DIAGNOSIS — I2510 Atherosclerotic heart disease of native coronary artery without angina pectoris: Secondary | ICD-10-CM | POA: Diagnosis not present

## 2016-05-25 DIAGNOSIS — G894 Chronic pain syndrome: Secondary | ICD-10-CM | POA: Diagnosis not present

## 2016-05-26 DIAGNOSIS — E119 Type 2 diabetes mellitus without complications: Secondary | ICD-10-CM | POA: Diagnosis not present

## 2016-05-26 DIAGNOSIS — M6281 Muscle weakness (generalized): Secondary | ICD-10-CM | POA: Diagnosis not present

## 2016-05-26 DIAGNOSIS — G4761 Periodic limb movement disorder: Secondary | ICD-10-CM | POA: Diagnosis not present

## 2016-05-26 DIAGNOSIS — G894 Chronic pain syndrome: Secondary | ICD-10-CM | POA: Diagnosis not present

## 2016-06-01 DIAGNOSIS — E119 Type 2 diabetes mellitus without complications: Secondary | ICD-10-CM | POA: Diagnosis not present

## 2016-06-01 DIAGNOSIS — I10 Essential (primary) hypertension: Secondary | ICD-10-CM | POA: Diagnosis not present

## 2016-06-08 DIAGNOSIS — N390 Urinary tract infection, site not specified: Secondary | ICD-10-CM | POA: Diagnosis not present

## 2016-06-09 ENCOUNTER — Other Ambulatory Visit: Payer: Self-pay | Admitting: Family Medicine

## 2016-06-15 DIAGNOSIS — E113293 Type 2 diabetes mellitus with mild nonproliferative diabetic retinopathy without macular edema, bilateral: Secondary | ICD-10-CM | POA: Diagnosis not present

## 2016-06-15 DIAGNOSIS — Z7901 Long term (current) use of anticoagulants: Secondary | ICD-10-CM | POA: Diagnosis not present

## 2016-06-15 DIAGNOSIS — Z794 Long term (current) use of insulin: Secondary | ICD-10-CM | POA: Diagnosis not present

## 2016-06-15 DIAGNOSIS — Z961 Presence of intraocular lens: Secondary | ICD-10-CM | POA: Diagnosis not present

## 2016-06-22 DIAGNOSIS — R05 Cough: Secondary | ICD-10-CM | POA: Diagnosis not present

## 2016-06-29 DIAGNOSIS — M6281 Muscle weakness (generalized): Secondary | ICD-10-CM | POA: Diagnosis not present

## 2016-07-02 DIAGNOSIS — R21 Rash and other nonspecific skin eruption: Secondary | ICD-10-CM | POA: Diagnosis not present

## 2016-07-08 DIAGNOSIS — G4761 Periodic limb movement disorder: Secondary | ICD-10-CM | POA: Diagnosis not present

## 2016-07-08 DIAGNOSIS — G894 Chronic pain syndrome: Secondary | ICD-10-CM | POA: Diagnosis not present

## 2016-07-08 DIAGNOSIS — M6281 Muscle weakness (generalized): Secondary | ICD-10-CM | POA: Diagnosis not present

## 2016-07-08 DIAGNOSIS — E119 Type 2 diabetes mellitus without complications: Secondary | ICD-10-CM | POA: Diagnosis not present

## 2016-07-17 ENCOUNTER — Other Ambulatory Visit: Payer: Self-pay | Admitting: *Deleted

## 2016-07-17 MED ORDER — DONEPEZIL HCL 5 MG PO TABS: 5.0000 mg | ORAL_TABLET | Freq: Every day | ORAL | 1 refills | Status: DC

## 2016-07-18 DIAGNOSIS — N390 Urinary tract infection, site not specified: Secondary | ICD-10-CM | POA: Diagnosis not present

## 2016-07-20 DIAGNOSIS — R05 Cough: Secondary | ICD-10-CM | POA: Diagnosis not present

## 2016-07-21 DIAGNOSIS — I10 Essential (primary) hypertension: Secondary | ICD-10-CM | POA: Diagnosis not present

## 2016-08-04 DIAGNOSIS — G4761 Periodic limb movement disorder: Secondary | ICD-10-CM | POA: Diagnosis not present

## 2016-08-04 DIAGNOSIS — R54 Age-related physical debility: Secondary | ICD-10-CM | POA: Diagnosis not present

## 2016-08-04 DIAGNOSIS — R05 Cough: Secondary | ICD-10-CM | POA: Diagnosis not present

## 2016-08-04 DIAGNOSIS — G894 Chronic pain syndrome: Secondary | ICD-10-CM | POA: Diagnosis not present

## 2016-08-06 DIAGNOSIS — I4891 Unspecified atrial fibrillation: Secondary | ICD-10-CM | POA: Diagnosis not present

## 2016-08-06 DIAGNOSIS — J9612 Chronic respiratory failure with hypercapnia: Secondary | ICD-10-CM | POA: Diagnosis not present

## 2016-08-11 DIAGNOSIS — E119 Type 2 diabetes mellitus without complications: Secondary | ICD-10-CM | POA: Diagnosis not present

## 2016-08-11 DIAGNOSIS — L02419 Cutaneous abscess of limb, unspecified: Secondary | ICD-10-CM | POA: Diagnosis not present

## 2016-08-17 DIAGNOSIS — G894 Chronic pain syndrome: Secondary | ICD-10-CM | POA: Diagnosis not present

## 2016-08-19 ENCOUNTER — Other Ambulatory Visit: Payer: Self-pay | Admitting: Family Medicine

## 2016-08-23 DIAGNOSIS — R300 Dysuria: Secondary | ICD-10-CM | POA: Diagnosis not present

## 2016-08-24 DIAGNOSIS — E119 Type 2 diabetes mellitus without complications: Secondary | ICD-10-CM | POA: Diagnosis not present

## 2016-08-24 DIAGNOSIS — R54 Age-related physical debility: Secondary | ICD-10-CM | POA: Diagnosis not present

## 2016-08-24 DIAGNOSIS — G894 Chronic pain syndrome: Secondary | ICD-10-CM | POA: Diagnosis not present

## 2016-08-24 DIAGNOSIS — G4761 Periodic limb movement disorder: Secondary | ICD-10-CM | POA: Diagnosis not present

## 2016-08-27 DIAGNOSIS — B3789 Other sites of candidiasis: Secondary | ICD-10-CM | POA: Diagnosis not present

## 2016-08-27 DIAGNOSIS — L0232 Furuncle of buttock: Secondary | ICD-10-CM | POA: Diagnosis not present

## 2016-09-01 DIAGNOSIS — N390 Urinary tract infection, site not specified: Secondary | ICD-10-CM | POA: Diagnosis not present

## 2016-09-01 DIAGNOSIS — E119 Type 2 diabetes mellitus without complications: Secondary | ICD-10-CM | POA: Diagnosis not present

## 2016-09-06 DIAGNOSIS — J9612 Chronic respiratory failure with hypercapnia: Secondary | ICD-10-CM | POA: Diagnosis not present

## 2016-09-06 DIAGNOSIS — I4891 Unspecified atrial fibrillation: Secondary | ICD-10-CM | POA: Diagnosis not present

## 2016-09-10 DIAGNOSIS — Z9111 Patient's noncompliance with dietary regimen: Secondary | ICD-10-CM | POA: Diagnosis not present

## 2016-09-10 DIAGNOSIS — E119 Type 2 diabetes mellitus without complications: Secondary | ICD-10-CM | POA: Diagnosis not present

## 2016-09-11 DIAGNOSIS — L603 Nail dystrophy: Secondary | ICD-10-CM | POA: Diagnosis not present

## 2016-09-11 DIAGNOSIS — Q845 Enlarged and hypertrophic nails: Secondary | ICD-10-CM | POA: Diagnosis not present

## 2016-09-11 DIAGNOSIS — B351 Tinea unguium: Secondary | ICD-10-CM | POA: Diagnosis not present

## 2016-09-11 DIAGNOSIS — E1159 Type 2 diabetes mellitus with other circulatory complications: Secondary | ICD-10-CM | POA: Diagnosis not present

## 2016-09-15 DIAGNOSIS — E119 Type 2 diabetes mellitus without complications: Secondary | ICD-10-CM | POA: Diagnosis not present

## 2016-09-15 DIAGNOSIS — B950 Streptococcus, group A, as the cause of diseases classified elsewhere: Secondary | ICD-10-CM | POA: Diagnosis not present

## 2016-09-18 ENCOUNTER — Other Ambulatory Visit: Payer: Self-pay | Admitting: Family Medicine

## 2016-09-28 DIAGNOSIS — R05 Cough: Secondary | ICD-10-CM | POA: Diagnosis not present

## 2016-09-29 DIAGNOSIS — G894 Chronic pain syndrome: Secondary | ICD-10-CM | POA: Diagnosis not present

## 2016-10-01 DIAGNOSIS — I10 Essential (primary) hypertension: Secondary | ICD-10-CM | POA: Diagnosis not present

## 2016-10-04 DIAGNOSIS — J9612 Chronic respiratory failure with hypercapnia: Secondary | ICD-10-CM | POA: Diagnosis not present

## 2016-10-04 DIAGNOSIS — I4891 Unspecified atrial fibrillation: Secondary | ICD-10-CM | POA: Diagnosis not present

## 2016-10-13 DIAGNOSIS — R54 Age-related physical debility: Secondary | ICD-10-CM | POA: Diagnosis not present

## 2016-10-13 DIAGNOSIS — G894 Chronic pain syndrome: Secondary | ICD-10-CM | POA: Diagnosis not present

## 2016-10-13 DIAGNOSIS — E119 Type 2 diabetes mellitus without complications: Secondary | ICD-10-CM | POA: Diagnosis not present

## 2016-10-13 DIAGNOSIS — G4761 Periodic limb movement disorder: Secondary | ICD-10-CM | POA: Diagnosis not present

## 2016-10-23 DIAGNOSIS — G2581 Restless legs syndrome: Secondary | ICD-10-CM | POA: Diagnosis not present

## 2016-10-23 DIAGNOSIS — M6281 Muscle weakness (generalized): Secondary | ICD-10-CM | POA: Diagnosis not present

## 2016-10-23 DIAGNOSIS — E785 Hyperlipidemia, unspecified: Secondary | ICD-10-CM | POA: Diagnosis not present

## 2016-10-23 DIAGNOSIS — Z7902 Long term (current) use of antithrombotics/antiplatelets: Secondary | ICD-10-CM | POA: Diagnosis not present

## 2016-10-23 DIAGNOSIS — D509 Iron deficiency anemia, unspecified: Secondary | ICD-10-CM | POA: Diagnosis not present

## 2016-10-23 DIAGNOSIS — G894 Chronic pain syndrome: Secondary | ICD-10-CM | POA: Diagnosis not present

## 2016-10-23 DIAGNOSIS — E039 Hypothyroidism, unspecified: Secondary | ICD-10-CM | POA: Diagnosis not present

## 2016-10-23 DIAGNOSIS — I2510 Atherosclerotic heart disease of native coronary artery without angina pectoris: Secondary | ICD-10-CM | POA: Diagnosis not present

## 2016-10-23 DIAGNOSIS — I10 Essential (primary) hypertension: Secondary | ICD-10-CM | POA: Diagnosis not present

## 2016-10-23 DIAGNOSIS — G4733 Obstructive sleep apnea (adult) (pediatric): Secondary | ICD-10-CM | POA: Diagnosis not present

## 2016-10-23 DIAGNOSIS — E113299 Type 2 diabetes mellitus with mild nonproliferative diabetic retinopathy without macular edema, unspecified eye: Secondary | ICD-10-CM | POA: Diagnosis not present

## 2016-10-23 DIAGNOSIS — R2689 Other abnormalities of gait and mobility: Secondary | ICD-10-CM | POA: Diagnosis not present

## 2016-10-23 DIAGNOSIS — I4891 Unspecified atrial fibrillation: Secondary | ICD-10-CM | POA: Diagnosis not present

## 2016-10-23 DIAGNOSIS — M170 Bilateral primary osteoarthritis of knee: Secondary | ICD-10-CM | POA: Diagnosis not present

## 2016-10-23 DIAGNOSIS — Z7901 Long term (current) use of anticoagulants: Secondary | ICD-10-CM | POA: Diagnosis not present

## 2016-10-23 DIAGNOSIS — E559 Vitamin D deficiency, unspecified: Secondary | ICD-10-CM | POA: Diagnosis not present

## 2016-10-23 DIAGNOSIS — Z9981 Dependence on supplemental oxygen: Secondary | ICD-10-CM | POA: Diagnosis not present

## 2016-10-23 DIAGNOSIS — Z794 Long term (current) use of insulin: Secondary | ICD-10-CM | POA: Diagnosis not present

## 2016-10-23 DIAGNOSIS — G4700 Insomnia, unspecified: Secondary | ICD-10-CM | POA: Diagnosis not present

## 2016-10-25 DIAGNOSIS — I10 Essential (primary) hypertension: Secondary | ICD-10-CM | POA: Diagnosis not present

## 2016-10-25 DIAGNOSIS — E039 Hypothyroidism, unspecified: Secondary | ICD-10-CM | POA: Diagnosis not present

## 2016-10-25 DIAGNOSIS — Z7901 Long term (current) use of anticoagulants: Secondary | ICD-10-CM | POA: Diagnosis not present

## 2016-10-25 DIAGNOSIS — E113299 Type 2 diabetes mellitus with mild nonproliferative diabetic retinopathy without macular edema, unspecified eye: Secondary | ICD-10-CM | POA: Diagnosis not present

## 2016-10-25 DIAGNOSIS — M170 Bilateral primary osteoarthritis of knee: Secondary | ICD-10-CM | POA: Diagnosis not present

## 2016-10-25 DIAGNOSIS — E785 Hyperlipidemia, unspecified: Secondary | ICD-10-CM | POA: Diagnosis not present

## 2016-10-25 DIAGNOSIS — Z9981 Dependence on supplemental oxygen: Secondary | ICD-10-CM | POA: Diagnosis not present

## 2016-10-25 DIAGNOSIS — Z7902 Long term (current) use of antithrombotics/antiplatelets: Secondary | ICD-10-CM | POA: Diagnosis not present

## 2016-10-25 DIAGNOSIS — I2510 Atherosclerotic heart disease of native coronary artery without angina pectoris: Secondary | ICD-10-CM | POA: Diagnosis not present

## 2016-10-25 DIAGNOSIS — E559 Vitamin D deficiency, unspecified: Secondary | ICD-10-CM | POA: Diagnosis not present

## 2016-10-25 DIAGNOSIS — Z794 Long term (current) use of insulin: Secondary | ICD-10-CM | POA: Diagnosis not present

## 2016-10-25 DIAGNOSIS — D509 Iron deficiency anemia, unspecified: Secondary | ICD-10-CM | POA: Diagnosis not present

## 2016-10-25 DIAGNOSIS — G4700 Insomnia, unspecified: Secondary | ICD-10-CM | POA: Diagnosis not present

## 2016-10-25 DIAGNOSIS — G2581 Restless legs syndrome: Secondary | ICD-10-CM | POA: Diagnosis not present

## 2016-10-25 DIAGNOSIS — R2689 Other abnormalities of gait and mobility: Secondary | ICD-10-CM | POA: Diagnosis not present

## 2016-10-25 DIAGNOSIS — M6281 Muscle weakness (generalized): Secondary | ICD-10-CM | POA: Diagnosis not present

## 2016-10-25 DIAGNOSIS — G4733 Obstructive sleep apnea (adult) (pediatric): Secondary | ICD-10-CM | POA: Diagnosis not present

## 2016-10-25 DIAGNOSIS — I4891 Unspecified atrial fibrillation: Secondary | ICD-10-CM | POA: Diagnosis not present

## 2016-10-25 DIAGNOSIS — G894 Chronic pain syndrome: Secondary | ICD-10-CM | POA: Diagnosis not present

## 2016-10-26 ENCOUNTER — Ambulatory Visit (INDEPENDENT_AMBULATORY_CARE_PROVIDER_SITE_OTHER): Payer: Medicare Other | Admitting: Family Medicine

## 2016-10-26 ENCOUNTER — Encounter: Payer: Self-pay | Admitting: Family Medicine

## 2016-10-26 VITALS — BP 119/55 | HR 63 | Temp 96.8°F

## 2016-10-26 DIAGNOSIS — E1169 Type 2 diabetes mellitus with other specified complication: Secondary | ICD-10-CM

## 2016-10-26 DIAGNOSIS — E0842 Diabetes mellitus due to underlying condition with diabetic polyneuropathy: Secondary | ICD-10-CM | POA: Diagnosis not present

## 2016-10-26 DIAGNOSIS — E782 Mixed hyperlipidemia: Secondary | ICD-10-CM

## 2016-10-26 DIAGNOSIS — E034 Atrophy of thyroid (acquired): Secondary | ICD-10-CM

## 2016-10-26 DIAGNOSIS — E785 Hyperlipidemia, unspecified: Secondary | ICD-10-CM

## 2016-10-26 DIAGNOSIS — I10 Essential (primary) hypertension: Secondary | ICD-10-CM

## 2016-10-26 DIAGNOSIS — R3915 Urgency of urination: Secondary | ICD-10-CM

## 2016-10-26 LAB — BAYER DCA HB A1C WAIVED: HB A1C: 12.9 % — AB (ref <7.0)

## 2016-10-26 MED ORDER — "INSULIN SYRINGE-NEEDLE U-100 31G X 5/16"" 0.3 ML MISC": 1.0000 | Freq: Every day | 11 refills | Status: AC

## 2016-10-26 MED ORDER — ISOSORBIDE MONONITRATE ER 30 MG PO TB24: 30.0000 mg | ORAL_TABLET | ORAL | 2 refills | Status: DC

## 2016-10-26 MED ORDER — NYSTATIN 100000 UNIT/GM EX POWD: CUTANEOUS | 6 refills | Status: AC

## 2016-10-26 MED ORDER — TRAZODONE HCL 50 MG PO TABS
25.0000 mg | ORAL_TABLET | Freq: Every day | ORAL | 0 refills | Status: DC
Start: 2016-10-26 — End: 2016-11-23

## 2016-10-26 NOTE — Progress Notes (Signed)
BP (!) 119/55   Pulse 63   Temp (!) 96.8 F (36 C) (Oral)    Subjective:    Patient ID: [name], female    DOB: [birth year], 76 y.o.   MRN: [id number]  HPI: [name] is a 76 y.o. female presenting on 10/26/2016 for Followup after discharge from nursing home (needs prescripiton for syringes and needles for insulin, question about Imdur written as every other day but she thinks she's supposed to take daily, referral back to Dr. Nelva Bush; refill Trazodone; Aricept was discontinued in nursing home, wondering if she should restart medication)   HPI Type 2 diabetes mellitus Patient comes in today for check of Her diabetes, she was recently discharged from the nursing home where she was staying and her son is trying to help manage a lot of her medications and says that he is having a lot of troubles with it and doesn't understand a lot of them because he has never done it before. Patient has been currently taking Levemir 20 units twice a day and NovoLog on a sliding scale which is being used infrequently because the son does not know how to manage it. He says her blood sugars are running well over 300 and sometimes creep up in the evenings to being over 600.Marland Kitchen Patient is not currently on an ACE inhibitor because of renal function problems. Patient has not seen an ophthalmologist this year. Patient denies any major new issues with her feet. She does have some known neuropathy for which she is being treated but says it's pretty well controlled at this time.  Hypothyroidism recheck Patient is coming in for thyroid recheck today as well. He denies any issues with hair changes or heat or cold problems or diarrhea or constipation. He denies any chest pain or palpitations. He is currently on levothyroxine 75 micrograms  Hypertension Patient is coming in today for a blood pressure check and to reestablish care with our office. Her blood pressure today is 119/55. She is currently taking Lasix and  Imdur and metoprolol. She denies any lightheadedness or dizziness.  Hyperlipidemia Patient is coming in for recheck of his hyperlipidemia. She is currently taking Pravachol 80. She denies any issues with myalgias or history of liver damage from it. He denies any focal numbness or weakness or chest pain.   Urinary urgency She has been having urinary urgency and frequency that has been causing her a lot of issues over the past month since she has been out of the nursing home. With her blood sugar being up she also admits that she has increased thirst. She wants to be checked to see if she has any kind of infection. She denies any burning or fevers or chills or flank pain or abdominal pain.  Relevant past medical, surgical, family and social history reviewed and updated as indicated. Interim medical history since our last visit reviewed. Allergies and medications reviewed and updated.  Review of Systems  Constitutional: Negative for chills and fever.  HENT: Negative for congestion, ear discharge and ear pain.   Eyes: Negative for redness and visual disturbance.  Respiratory: Negative for chest tightness and shortness of breath.   Cardiovascular: Negative for chest pain and leg swelling.  Gastrointestinal: Negative for abdominal pain.  Genitourinary: Positive for frequency and urgency. Negative for decreased urine volume, difficulty urinating, dysuria, flank pain, hematuria, vaginal bleeding, vaginal discharge and vaginal pain.  Musculoskeletal: Negative for back pain and gait problem.  Skin: Negative for rash.  Neurological:  Positive for numbness. Negative for dizziness, weakness, light-headedness and headaches.  Psychiatric/Behavioral: Negative for agitation and behavioral problems.  All other systems reviewed and are negative.   Per HPI unless specifically indicated above        Objective:    BP (!) 119/55   Pulse 63   Temp (!) 96.8 F (36 C) (Oral)   Wt Readings from Last 3  Encounters:  10/31/15 275 lb (124.7 kg)  10/03/15 276 lb (125.2 kg)  07/24/15 270 lb (122.5 kg)    Physical Exam  Constitutional: She is oriented to person, place, and time. She appears well-developed and well-nourished. No distress.  Eyes: Conjunctivae are normal.  Cardiovascular: Normal rate, regular rhythm, normal heart sounds and intact distal pulses.   No murmur heard. Pulmonary/Chest: Effort normal and breath sounds normal. No respiratory distress. She has no wheezes. She has no rales.  Abdominal: Soft. Bowel sounds are normal. She exhibits no distension. There is no tenderness. There is no rebound.  Musculoskeletal: Normal range of motion. She exhibits no edema or tenderness.  Neurological: She is alert and oriented to person, place, and time. Coordination normal.  Skin: Skin is warm and dry. No rash noted. She is not diaphoretic.  Psychiatric: She has a normal mood and affect. Her behavior is normal.  Nursing note and vitals reviewed.  Urinalysis: Greater than 10 epithelial cells, 3+ sugar, 0-5 WBCs no RBCs.    Assessment & Plan:   Problem List Items Addressed This Visit      Cardiovascular and Mediastinum   Hypertension   Relevant Medications   metoprolol tartrate (LOPRESSOR) 25 MG tablet   furosemide (LASIX) 20 MG tablet   isosorbide mononitrate (IMDUR) 30 MG 24 hr tablet     Endocrine   Hypothyroidism   Relevant Medications   metoprolol tartrate (LOPRESSOR) 25 MG tablet   Other Relevant Orders   TSH (Completed)   Diabetic neuropathy (HCC)   Relevant Medications   insulin detemir (LEVEMIR) 100 UNIT/ML injection   Insulin Syringe-Needle U-100 (ADVOCATE INSULIN SYRINGE) 31G X 5/16" 0.3 ML MISC   Other Relevant Orders   CBC with Differential/Platelet (Completed)   Bayer DCA Hb A1c Waived (Completed)   Type 2 diabetes mellitus with hyperlipidemia (HCC) - Primary   Relevant Medications   insulin detemir (LEVEMIR) 100 UNIT/ML injection   metoprolol tartrate  (LOPRESSOR) 25 MG tablet   furosemide (LASIX) 20 MG tablet   isosorbide mononitrate (IMDUR) 30 MG 24 hr tablet   Insulin Syringe-Needle U-100 (ADVOCATE INSULIN SYRINGE) 31G X 5/16" 0.3 ML MISC   Other Relevant Orders   CMP14+EGFR (Completed)   CBC with Differential/Platelet (Completed)   Bayer DCA Hb A1c Waived (Completed)     Other   Morbid obesity (HCC)   Relevant Medications   insulin detemir (LEVEMIR) 100 UNIT/ML injection   Other Relevant Orders   Lipid panel (Completed)   Hyperlipemia   Relevant Medications   metoprolol tartrate (LOPRESSOR) 25 MG tablet   furosemide (LASIX) 20 MG tablet   isosorbide mononitrate (IMDUR) 30 MG 24 hr tablet   Other Relevant Orders   Lipid panel (Completed)    Other Visit Diagnoses    Urinary urgency       Relevant Orders   Urinalysis, Complete (Completed)      Urine is likely from diabetes and the blood sugar being elevated, will continue to increase and educated patient on how to take the NovoLog more consistently with meals. We will start at 10 units with each  meal and go up from there as needed. Continue Levemir at the current dose.   Follow up plan: Return in about 4 weeks (around 11/23/2016), or if symptoms worsen or fail to improve, for recheck diabetes.  Counseling provided for all of the vaccine components Orders Placed This Encounter  Procedures  . CMP14+EGFR  . Lipid panel  . CBC with Differential/Platelet  . TSH  . Bayer [facility] Hb A1c [place], MD [facility] 10/26/2016, 12:39 PM

## 2016-10-27 ENCOUNTER — Other Ambulatory Visit: Payer: Self-pay | Admitting: Family

## 2016-10-27 LAB — CBC WITH DIFFERENTIAL/PLATELET
BASOS: 0 %
Basophils Absolute: 0 10*3/uL (ref 0.0–0.2)
EOS (ABSOLUTE): 0.1 10*3/uL (ref 0.0–0.4)
EOS: 2 %
HEMATOCRIT: 39.7 % (ref 34.0–46.6)
Hemoglobin: 12 g/dL (ref 11.1–15.9)
IMMATURE GRANS (ABS): 0 10*3/uL (ref 0.0–0.1)
IMMATURE GRANULOCYTES: 0 %
LYMPHS: 31 %
Lymphocytes Absolute: 2.3 10*3/uL (ref 0.7–3.1)
MCH: 29.6 pg (ref 26.6–33.0)
MCHC: 30.2 g/dL — ABNORMAL LOW (ref 31.5–35.7)
MCV: 98 fL — AB (ref 79–97)
Monocytes Absolute: 0.8 10*3/uL (ref 0.1–0.9)
Monocytes: 10 %
NEUTROS ABS: 4.3 10*3/uL (ref 1.4–7.0)
NEUTROS PCT: 57 %
Platelets: 197 10*3/uL (ref 150–379)
RBC: 4.05 x10E6/uL (ref 3.77–5.28)
RDW: 13.3 % (ref 12.3–15.4)
WBC: 7.6 10*3/uL (ref 3.4–10.8)

## 2016-10-27 LAB — CMP14+EGFR
ALK PHOS: 101 IU/L (ref 39–117)
ALT: 15 IU/L (ref 0–32)
AST: 18 IU/L (ref 0–40)
Albumin/Globulin Ratio: 1.4 (ref 1.2–2.2)
Albumin: 3.4 g/dL — ABNORMAL LOW (ref 3.5–4.8)
BILIRUBIN TOTAL: 0.2 mg/dL (ref 0.0–1.2)
BUN/Creatinine Ratio: 29 — ABNORMAL HIGH (ref 12–28)
BUN: 22 mg/dL (ref 8–27)
CHLORIDE: 94 mmol/L — AB (ref 96–106)
CO2: 27 mmol/L (ref 18–29)
CREATININE: 0.77 mg/dL (ref 0.57–1.00)
Calcium: 8.7 mg/dL (ref 8.7–10.3)
GFR calc Af Amer: 88 mL/min/{1.73_m2} (ref >59)
GFR calc non Af Amer: 76 mL/min/{1.73_m2} (ref >59)
GLOBULIN, TOTAL: 2.4 g/dL (ref 1.5–4.5)
Glucose: 544 mg/dL (ref 65–99)
POTASSIUM: 4.6 mmol/L (ref 3.5–5.2)
SODIUM: 139 mmol/L (ref 134–144)
Total Protein: 5.8 g/dL — ABNORMAL LOW (ref 6.0–8.5)

## 2016-10-27 LAB — LIPID PANEL
CHOLESTEROL TOTAL: 167 mg/dL (ref 100–199)
Chol/HDL Ratio: 4.9 ratio — ABNORMAL HIGH (ref 0.0–4.4)
HDL: 34 mg/dL — ABNORMAL LOW (ref >39)
LDL Calculated: 87 mg/dL (ref 0–99)
TRIGLYCERIDES: 232 mg/dL — AB (ref 0–149)
VLDL CHOLESTEROL CAL: 46 mg/dL — AB (ref 5–40)

## 2016-10-27 LAB — TSH: TSH: 1.81 u[IU]/mL (ref 0.450–4.500)

## 2016-10-28 ENCOUNTER — Other Ambulatory Visit: Payer: Medicare Other

## 2016-10-28 DIAGNOSIS — R2689 Other abnormalities of gait and mobility: Secondary | ICD-10-CM | POA: Diagnosis not present

## 2016-10-28 DIAGNOSIS — I4891 Unspecified atrial fibrillation: Secondary | ICD-10-CM | POA: Diagnosis not present

## 2016-10-28 DIAGNOSIS — M6281 Muscle weakness (generalized): Secondary | ICD-10-CM | POA: Diagnosis not present

## 2016-10-28 DIAGNOSIS — I10 Essential (primary) hypertension: Secondary | ICD-10-CM | POA: Diagnosis not present

## 2016-10-28 DIAGNOSIS — N390 Urinary tract infection, site not specified: Principal | ICD-10-CM

## 2016-10-28 DIAGNOSIS — E785 Hyperlipidemia, unspecified: Secondary | ICD-10-CM | POA: Diagnosis not present

## 2016-10-28 DIAGNOSIS — R32 Unspecified urinary incontinence: Secondary | ICD-10-CM

## 2016-10-28 DIAGNOSIS — R3915 Urgency of urination: Secondary | ICD-10-CM | POA: Diagnosis not present

## 2016-10-28 DIAGNOSIS — Z7902 Long term (current) use of antithrombotics/antiplatelets: Secondary | ICD-10-CM | POA: Diagnosis not present

## 2016-10-28 DIAGNOSIS — Z7901 Long term (current) use of anticoagulants: Secondary | ICD-10-CM | POA: Diagnosis not present

## 2016-10-28 DIAGNOSIS — I2510 Atherosclerotic heart disease of native coronary artery without angina pectoris: Secondary | ICD-10-CM | POA: Diagnosis not present

## 2016-10-28 DIAGNOSIS — M170 Bilateral primary osteoarthritis of knee: Secondary | ICD-10-CM | POA: Diagnosis not present

## 2016-10-28 DIAGNOSIS — E559 Vitamin D deficiency, unspecified: Secondary | ICD-10-CM | POA: Diagnosis not present

## 2016-10-28 DIAGNOSIS — G4733 Obstructive sleep apnea (adult) (pediatric): Secondary | ICD-10-CM | POA: Diagnosis not present

## 2016-10-28 DIAGNOSIS — B9620 Unspecified Escherichia coli [E. coli] as the cause of diseases classified elsewhere: Secondary | ICD-10-CM

## 2016-10-28 DIAGNOSIS — E039 Hypothyroidism, unspecified: Secondary | ICD-10-CM | POA: Diagnosis not present

## 2016-10-28 DIAGNOSIS — E113299 Type 2 diabetes mellitus with mild nonproliferative diabetic retinopathy without macular edema, unspecified eye: Secondary | ICD-10-CM | POA: Diagnosis not present

## 2016-10-28 DIAGNOSIS — Z9981 Dependence on supplemental oxygen: Secondary | ICD-10-CM | POA: Diagnosis not present

## 2016-10-28 DIAGNOSIS — D509 Iron deficiency anemia, unspecified: Secondary | ICD-10-CM | POA: Diagnosis not present

## 2016-10-28 DIAGNOSIS — G894 Chronic pain syndrome: Secondary | ICD-10-CM | POA: Diagnosis not present

## 2016-10-28 DIAGNOSIS — Z794 Long term (current) use of insulin: Secondary | ICD-10-CM | POA: Diagnosis not present

## 2016-10-28 DIAGNOSIS — G2581 Restless legs syndrome: Secondary | ICD-10-CM | POA: Diagnosis not present

## 2016-10-28 DIAGNOSIS — G4700 Insomnia, unspecified: Secondary | ICD-10-CM | POA: Diagnosis not present

## 2016-10-28 LAB — URINALYSIS, COMPLETE
Bilirubin, UA: NEGATIVE
Ketones, UA: NEGATIVE
Leukocytes, UA: NEGATIVE
Nitrite, UA: NEGATIVE
PH UA: 5 (ref 5.0–7.5)
PROTEIN UA: NEGATIVE
RBC, UA: NEGATIVE
Specific Gravity, UA: 1.015 (ref 1.005–1.030)
UUROB: 0.2 mg/dL (ref 0.2–1.0)

## 2016-10-28 LAB — MICROSCOPIC EXAMINATION
BACTERIA UA: NONE SEEN
Epithelial Cells (non renal): >10 /hpf — AB (ref 0–10)
RBC, UA: NONE SEEN /hpf (ref 0–?)
RENAL EPITHEL UA: NONE SEEN /HPF

## 2016-10-30 DIAGNOSIS — M6281 Muscle weakness (generalized): Secondary | ICD-10-CM | POA: Diagnosis not present

## 2016-10-30 DIAGNOSIS — I2510 Atherosclerotic heart disease of native coronary artery without angina pectoris: Secondary | ICD-10-CM | POA: Diagnosis not present

## 2016-10-30 DIAGNOSIS — Z794 Long term (current) use of insulin: Secondary | ICD-10-CM | POA: Diagnosis not present

## 2016-10-30 DIAGNOSIS — Z7902 Long term (current) use of antithrombotics/antiplatelets: Secondary | ICD-10-CM | POA: Diagnosis not present

## 2016-10-30 DIAGNOSIS — G894 Chronic pain syndrome: Secondary | ICD-10-CM | POA: Diagnosis not present

## 2016-10-30 DIAGNOSIS — R2689 Other abnormalities of gait and mobility: Secondary | ICD-10-CM | POA: Diagnosis not present

## 2016-10-30 DIAGNOSIS — M170 Bilateral primary osteoarthritis of knee: Secondary | ICD-10-CM | POA: Diagnosis not present

## 2016-10-30 DIAGNOSIS — G4733 Obstructive sleep apnea (adult) (pediatric): Secondary | ICD-10-CM | POA: Diagnosis not present

## 2016-10-30 DIAGNOSIS — G2581 Restless legs syndrome: Secondary | ICD-10-CM | POA: Diagnosis not present

## 2016-10-30 DIAGNOSIS — E785 Hyperlipidemia, unspecified: Secondary | ICD-10-CM | POA: Diagnosis not present

## 2016-10-30 DIAGNOSIS — G4700 Insomnia, unspecified: Secondary | ICD-10-CM | POA: Diagnosis not present

## 2016-10-30 DIAGNOSIS — Z7901 Long term (current) use of anticoagulants: Secondary | ICD-10-CM | POA: Diagnosis not present

## 2016-10-30 DIAGNOSIS — I10 Essential (primary) hypertension: Secondary | ICD-10-CM | POA: Diagnosis not present

## 2016-10-30 DIAGNOSIS — D509 Iron deficiency anemia, unspecified: Secondary | ICD-10-CM | POA: Diagnosis not present

## 2016-10-30 DIAGNOSIS — I4891 Unspecified atrial fibrillation: Secondary | ICD-10-CM | POA: Diagnosis not present

## 2016-10-30 DIAGNOSIS — E559 Vitamin D deficiency, unspecified: Secondary | ICD-10-CM | POA: Diagnosis not present

## 2016-10-30 DIAGNOSIS — E113299 Type 2 diabetes mellitus with mild nonproliferative diabetic retinopathy without macular edema, unspecified eye: Secondary | ICD-10-CM | POA: Diagnosis not present

## 2016-10-30 DIAGNOSIS — E039 Hypothyroidism, unspecified: Secondary | ICD-10-CM | POA: Diagnosis not present

## 2016-10-30 DIAGNOSIS — Z9981 Dependence on supplemental oxygen: Secondary | ICD-10-CM | POA: Diagnosis not present

## 2016-10-30 LAB — URINE CULTURE

## 2016-10-30 MED ORDER — CIPROFLOXACIN HCL 500 MG PO TABS: 500.0000 mg | ORAL_TABLET | Freq: Two times a day (BID) | ORAL | 0 refills | Status: DC

## 2016-11-02 DIAGNOSIS — Z9981 Dependence on supplemental oxygen: Secondary | ICD-10-CM | POA: Diagnosis not present

## 2016-11-02 DIAGNOSIS — M6281 Muscle weakness (generalized): Secondary | ICD-10-CM | POA: Diagnosis not present

## 2016-11-02 DIAGNOSIS — Z794 Long term (current) use of insulin: Secondary | ICD-10-CM | POA: Diagnosis not present

## 2016-11-02 DIAGNOSIS — I10 Essential (primary) hypertension: Secondary | ICD-10-CM | POA: Diagnosis not present

## 2016-11-02 DIAGNOSIS — G894 Chronic pain syndrome: Secondary | ICD-10-CM | POA: Diagnosis not present

## 2016-11-02 DIAGNOSIS — Z7902 Long term (current) use of antithrombotics/antiplatelets: Secondary | ICD-10-CM | POA: Diagnosis not present

## 2016-11-02 DIAGNOSIS — E785 Hyperlipidemia, unspecified: Secondary | ICD-10-CM | POA: Diagnosis not present

## 2016-11-02 DIAGNOSIS — E559 Vitamin D deficiency, unspecified: Secondary | ICD-10-CM | POA: Diagnosis not present

## 2016-11-02 DIAGNOSIS — I4891 Unspecified atrial fibrillation: Secondary | ICD-10-CM | POA: Diagnosis not present

## 2016-11-02 DIAGNOSIS — E039 Hypothyroidism, unspecified: Secondary | ICD-10-CM | POA: Diagnosis not present

## 2016-11-02 DIAGNOSIS — D509 Iron deficiency anemia, unspecified: Secondary | ICD-10-CM | POA: Diagnosis not present

## 2016-11-02 DIAGNOSIS — E113299 Type 2 diabetes mellitus with mild nonproliferative diabetic retinopathy without macular edema, unspecified eye: Secondary | ICD-10-CM | POA: Diagnosis not present

## 2016-11-02 DIAGNOSIS — I2510 Atherosclerotic heart disease of native coronary artery without angina pectoris: Secondary | ICD-10-CM | POA: Diagnosis not present

## 2016-11-02 DIAGNOSIS — M170 Bilateral primary osteoarthritis of knee: Secondary | ICD-10-CM | POA: Diagnosis not present

## 2016-11-02 DIAGNOSIS — R2689 Other abnormalities of gait and mobility: Secondary | ICD-10-CM | POA: Diagnosis not present

## 2016-11-02 DIAGNOSIS — G4700 Insomnia, unspecified: Secondary | ICD-10-CM | POA: Diagnosis not present

## 2016-11-02 DIAGNOSIS — G4733 Obstructive sleep apnea (adult) (pediatric): Secondary | ICD-10-CM | POA: Diagnosis not present

## 2016-11-02 DIAGNOSIS — G2581 Restless legs syndrome: Secondary | ICD-10-CM | POA: Diagnosis not present

## 2016-11-02 DIAGNOSIS — Z7901 Long term (current) use of anticoagulants: Secondary | ICD-10-CM | POA: Diagnosis not present

## 2016-11-04 DIAGNOSIS — I10 Essential (primary) hypertension: Secondary | ICD-10-CM | POA: Diagnosis not present

## 2016-11-04 DIAGNOSIS — G4733 Obstructive sleep apnea (adult) (pediatric): Secondary | ICD-10-CM | POA: Diagnosis not present

## 2016-11-04 DIAGNOSIS — D509 Iron deficiency anemia, unspecified: Secondary | ICD-10-CM | POA: Diagnosis not present

## 2016-11-04 DIAGNOSIS — G4700 Insomnia, unspecified: Secondary | ICD-10-CM | POA: Diagnosis not present

## 2016-11-04 DIAGNOSIS — I4891 Unspecified atrial fibrillation: Secondary | ICD-10-CM | POA: Diagnosis not present

## 2016-11-04 DIAGNOSIS — Z7902 Long term (current) use of antithrombotics/antiplatelets: Secondary | ICD-10-CM | POA: Diagnosis not present

## 2016-11-04 DIAGNOSIS — R2689 Other abnormalities of gait and mobility: Secondary | ICD-10-CM | POA: Diagnosis not present

## 2016-11-04 DIAGNOSIS — E785 Hyperlipidemia, unspecified: Secondary | ICD-10-CM | POA: Diagnosis not present

## 2016-11-04 DIAGNOSIS — G2581 Restless legs syndrome: Secondary | ICD-10-CM | POA: Diagnosis not present

## 2016-11-04 DIAGNOSIS — E113299 Type 2 diabetes mellitus with mild nonproliferative diabetic retinopathy without macular edema, unspecified eye: Secondary | ICD-10-CM | POA: Diagnosis not present

## 2016-11-04 DIAGNOSIS — I2510 Atherosclerotic heart disease of native coronary artery without angina pectoris: Secondary | ICD-10-CM | POA: Diagnosis not present

## 2016-11-04 DIAGNOSIS — G894 Chronic pain syndrome: Secondary | ICD-10-CM | POA: Diagnosis not present

## 2016-11-04 DIAGNOSIS — J9612 Chronic respiratory failure with hypercapnia: Secondary | ICD-10-CM | POA: Diagnosis not present

## 2016-11-04 DIAGNOSIS — M170 Bilateral primary osteoarthritis of knee: Secondary | ICD-10-CM | POA: Diagnosis not present

## 2016-11-04 DIAGNOSIS — M6281 Muscle weakness (generalized): Secondary | ICD-10-CM | POA: Diagnosis not present

## 2016-11-04 DIAGNOSIS — Z7901 Long term (current) use of anticoagulants: Secondary | ICD-10-CM | POA: Diagnosis not present

## 2016-11-04 DIAGNOSIS — Z794 Long term (current) use of insulin: Secondary | ICD-10-CM | POA: Diagnosis not present

## 2016-11-04 DIAGNOSIS — E039 Hypothyroidism, unspecified: Secondary | ICD-10-CM | POA: Diagnosis not present

## 2016-11-04 DIAGNOSIS — Z9981 Dependence on supplemental oxygen: Secondary | ICD-10-CM | POA: Diagnosis not present

## 2016-11-04 DIAGNOSIS — E559 Vitamin D deficiency, unspecified: Secondary | ICD-10-CM | POA: Diagnosis not present

## 2016-11-05 DIAGNOSIS — Z7901 Long term (current) use of anticoagulants: Secondary | ICD-10-CM | POA: Diagnosis not present

## 2016-11-05 DIAGNOSIS — E039 Hypothyroidism, unspecified: Secondary | ICD-10-CM | POA: Diagnosis not present

## 2016-11-05 DIAGNOSIS — E113299 Type 2 diabetes mellitus with mild nonproliferative diabetic retinopathy without macular edema, unspecified eye: Secondary | ICD-10-CM | POA: Diagnosis not present

## 2016-11-05 DIAGNOSIS — G4700 Insomnia, unspecified: Secondary | ICD-10-CM | POA: Diagnosis not present

## 2016-11-05 DIAGNOSIS — Z794 Long term (current) use of insulin: Secondary | ICD-10-CM | POA: Diagnosis not present

## 2016-11-05 DIAGNOSIS — E785 Hyperlipidemia, unspecified: Secondary | ICD-10-CM | POA: Diagnosis not present

## 2016-11-05 DIAGNOSIS — Z9981 Dependence on supplemental oxygen: Secondary | ICD-10-CM | POA: Diagnosis not present

## 2016-11-05 DIAGNOSIS — E559 Vitamin D deficiency, unspecified: Secondary | ICD-10-CM | POA: Diagnosis not present

## 2016-11-05 DIAGNOSIS — G4733 Obstructive sleep apnea (adult) (pediatric): Secondary | ICD-10-CM | POA: Diagnosis not present

## 2016-11-05 DIAGNOSIS — Z7902 Long term (current) use of antithrombotics/antiplatelets: Secondary | ICD-10-CM | POA: Diagnosis not present

## 2016-11-05 DIAGNOSIS — M170 Bilateral primary osteoarthritis of knee: Secondary | ICD-10-CM | POA: Diagnosis not present

## 2016-11-05 DIAGNOSIS — D509 Iron deficiency anemia, unspecified: Secondary | ICD-10-CM | POA: Diagnosis not present

## 2016-11-05 DIAGNOSIS — M6281 Muscle weakness (generalized): Secondary | ICD-10-CM | POA: Diagnosis not present

## 2016-11-05 DIAGNOSIS — I10 Essential (primary) hypertension: Secondary | ICD-10-CM | POA: Diagnosis not present

## 2016-11-05 DIAGNOSIS — G894 Chronic pain syndrome: Secondary | ICD-10-CM | POA: Diagnosis not present

## 2016-11-05 DIAGNOSIS — G2581 Restless legs syndrome: Secondary | ICD-10-CM | POA: Diagnosis not present

## 2016-11-05 DIAGNOSIS — I4891 Unspecified atrial fibrillation: Secondary | ICD-10-CM | POA: Diagnosis not present

## 2016-11-05 DIAGNOSIS — I2510 Atherosclerotic heart disease of native coronary artery without angina pectoris: Secondary | ICD-10-CM | POA: Diagnosis not present

## 2016-11-05 DIAGNOSIS — R2689 Other abnormalities of gait and mobility: Secondary | ICD-10-CM | POA: Diagnosis not present

## 2016-11-06 DIAGNOSIS — E113299 Type 2 diabetes mellitus with mild nonproliferative diabetic retinopathy without macular edema, unspecified eye: Secondary | ICD-10-CM | POA: Diagnosis not present

## 2016-11-06 DIAGNOSIS — D509 Iron deficiency anemia, unspecified: Secondary | ICD-10-CM | POA: Diagnosis not present

## 2016-11-06 DIAGNOSIS — Z7902 Long term (current) use of antithrombotics/antiplatelets: Secondary | ICD-10-CM | POA: Diagnosis not present

## 2016-11-06 DIAGNOSIS — E559 Vitamin D deficiency, unspecified: Secondary | ICD-10-CM | POA: Diagnosis not present

## 2016-11-06 DIAGNOSIS — Z7901 Long term (current) use of anticoagulants: Secondary | ICD-10-CM | POA: Diagnosis not present

## 2016-11-06 DIAGNOSIS — E785 Hyperlipidemia, unspecified: Secondary | ICD-10-CM | POA: Diagnosis not present

## 2016-11-06 DIAGNOSIS — G4733 Obstructive sleep apnea (adult) (pediatric): Secondary | ICD-10-CM | POA: Diagnosis not present

## 2016-11-06 DIAGNOSIS — Z9981 Dependence on supplemental oxygen: Secondary | ICD-10-CM | POA: Diagnosis not present

## 2016-11-06 DIAGNOSIS — I4891 Unspecified atrial fibrillation: Secondary | ICD-10-CM | POA: Diagnosis not present

## 2016-11-06 DIAGNOSIS — G4700 Insomnia, unspecified: Secondary | ICD-10-CM | POA: Diagnosis not present

## 2016-11-06 DIAGNOSIS — G894 Chronic pain syndrome: Secondary | ICD-10-CM | POA: Diagnosis not present

## 2016-11-06 DIAGNOSIS — Z794 Long term (current) use of insulin: Secondary | ICD-10-CM | POA: Diagnosis not present

## 2016-11-06 DIAGNOSIS — E039 Hypothyroidism, unspecified: Secondary | ICD-10-CM | POA: Diagnosis not present

## 2016-11-06 DIAGNOSIS — M6281 Muscle weakness (generalized): Secondary | ICD-10-CM | POA: Diagnosis not present

## 2016-11-06 DIAGNOSIS — M170 Bilateral primary osteoarthritis of knee: Secondary | ICD-10-CM | POA: Diagnosis not present

## 2016-11-06 DIAGNOSIS — R2689 Other abnormalities of gait and mobility: Secondary | ICD-10-CM | POA: Diagnosis not present

## 2016-11-06 DIAGNOSIS — G2581 Restless legs syndrome: Secondary | ICD-10-CM | POA: Diagnosis not present

## 2016-11-06 DIAGNOSIS — I2510 Atherosclerotic heart disease of native coronary artery without angina pectoris: Secondary | ICD-10-CM | POA: Diagnosis not present

## 2016-11-06 DIAGNOSIS — I10 Essential (primary) hypertension: Secondary | ICD-10-CM | POA: Diagnosis not present

## 2016-11-09 DIAGNOSIS — Z7902 Long term (current) use of antithrombotics/antiplatelets: Secondary | ICD-10-CM | POA: Diagnosis not present

## 2016-11-09 DIAGNOSIS — I2510 Atherosclerotic heart disease of native coronary artery without angina pectoris: Secondary | ICD-10-CM | POA: Diagnosis not present

## 2016-11-09 DIAGNOSIS — E785 Hyperlipidemia, unspecified: Secondary | ICD-10-CM | POA: Diagnosis not present

## 2016-11-09 DIAGNOSIS — E113299 Type 2 diabetes mellitus with mild nonproliferative diabetic retinopathy without macular edema, unspecified eye: Secondary | ICD-10-CM | POA: Diagnosis not present

## 2016-11-09 DIAGNOSIS — E559 Vitamin D deficiency, unspecified: Secondary | ICD-10-CM | POA: Diagnosis not present

## 2016-11-09 DIAGNOSIS — G4700 Insomnia, unspecified: Secondary | ICD-10-CM | POA: Diagnosis not present

## 2016-11-09 DIAGNOSIS — Z794 Long term (current) use of insulin: Secondary | ICD-10-CM | POA: Diagnosis not present

## 2016-11-09 DIAGNOSIS — G2581 Restless legs syndrome: Secondary | ICD-10-CM | POA: Diagnosis not present

## 2016-11-09 DIAGNOSIS — Z9981 Dependence on supplemental oxygen: Secondary | ICD-10-CM | POA: Diagnosis not present

## 2016-11-09 DIAGNOSIS — G894 Chronic pain syndrome: Secondary | ICD-10-CM | POA: Diagnosis not present

## 2016-11-09 DIAGNOSIS — R2689 Other abnormalities of gait and mobility: Secondary | ICD-10-CM | POA: Diagnosis not present

## 2016-11-09 DIAGNOSIS — I10 Essential (primary) hypertension: Secondary | ICD-10-CM | POA: Diagnosis not present

## 2016-11-09 DIAGNOSIS — D509 Iron deficiency anemia, unspecified: Secondary | ICD-10-CM | POA: Diagnosis not present

## 2016-11-09 DIAGNOSIS — E039 Hypothyroidism, unspecified: Secondary | ICD-10-CM | POA: Diagnosis not present

## 2016-11-09 DIAGNOSIS — G4733 Obstructive sleep apnea (adult) (pediatric): Secondary | ICD-10-CM | POA: Diagnosis not present

## 2016-11-09 DIAGNOSIS — I4891 Unspecified atrial fibrillation: Secondary | ICD-10-CM | POA: Diagnosis not present

## 2016-11-09 DIAGNOSIS — M6281 Muscle weakness (generalized): Secondary | ICD-10-CM | POA: Diagnosis not present

## 2016-11-09 DIAGNOSIS — Z7901 Long term (current) use of anticoagulants: Secondary | ICD-10-CM | POA: Diagnosis not present

## 2016-11-09 DIAGNOSIS — M170 Bilateral primary osteoarthritis of knee: Secondary | ICD-10-CM | POA: Diagnosis not present

## 2016-11-10 DIAGNOSIS — R2689 Other abnormalities of gait and mobility: Secondary | ICD-10-CM | POA: Diagnosis not present

## 2016-11-10 DIAGNOSIS — I10 Essential (primary) hypertension: Secondary | ICD-10-CM | POA: Diagnosis not present

## 2016-11-10 DIAGNOSIS — G4700 Insomnia, unspecified: Secondary | ICD-10-CM | POA: Diagnosis not present

## 2016-11-10 DIAGNOSIS — I4891 Unspecified atrial fibrillation: Secondary | ICD-10-CM | POA: Diagnosis not present

## 2016-11-10 DIAGNOSIS — E785 Hyperlipidemia, unspecified: Secondary | ICD-10-CM | POA: Diagnosis not present

## 2016-11-10 DIAGNOSIS — E113299 Type 2 diabetes mellitus with mild nonproliferative diabetic retinopathy without macular edema, unspecified eye: Secondary | ICD-10-CM | POA: Diagnosis not present

## 2016-11-10 DIAGNOSIS — D509 Iron deficiency anemia, unspecified: Secondary | ICD-10-CM | POA: Diagnosis not present

## 2016-11-10 DIAGNOSIS — M170 Bilateral primary osteoarthritis of knee: Secondary | ICD-10-CM | POA: Diagnosis not present

## 2016-11-10 DIAGNOSIS — M6281 Muscle weakness (generalized): Secondary | ICD-10-CM | POA: Diagnosis not present

## 2016-11-10 DIAGNOSIS — G2581 Restless legs syndrome: Secondary | ICD-10-CM | POA: Diagnosis not present

## 2016-11-10 DIAGNOSIS — G4733 Obstructive sleep apnea (adult) (pediatric): Secondary | ICD-10-CM | POA: Diagnosis not present

## 2016-11-10 DIAGNOSIS — Z794 Long term (current) use of insulin: Secondary | ICD-10-CM | POA: Diagnosis not present

## 2016-11-10 DIAGNOSIS — I2510 Atherosclerotic heart disease of native coronary artery without angina pectoris: Secondary | ICD-10-CM | POA: Diagnosis not present

## 2016-11-10 DIAGNOSIS — E559 Vitamin D deficiency, unspecified: Secondary | ICD-10-CM | POA: Diagnosis not present

## 2016-11-10 DIAGNOSIS — Z7902 Long term (current) use of antithrombotics/antiplatelets: Secondary | ICD-10-CM | POA: Diagnosis not present

## 2016-11-10 DIAGNOSIS — E039 Hypothyroidism, unspecified: Secondary | ICD-10-CM | POA: Diagnosis not present

## 2016-11-10 DIAGNOSIS — Z7901 Long term (current) use of anticoagulants: Secondary | ICD-10-CM | POA: Diagnosis not present

## 2016-11-10 DIAGNOSIS — Z9981 Dependence on supplemental oxygen: Secondary | ICD-10-CM | POA: Diagnosis not present

## 2016-11-10 DIAGNOSIS — G894 Chronic pain syndrome: Secondary | ICD-10-CM | POA: Diagnosis not present

## 2016-11-11 ENCOUNTER — Other Ambulatory Visit: Payer: Self-pay | Admitting: Family Medicine

## 2016-11-11 DIAGNOSIS — I10 Essential (primary) hypertension: Secondary | ICD-10-CM | POA: Diagnosis not present

## 2016-11-11 DIAGNOSIS — E559 Vitamin D deficiency, unspecified: Secondary | ICD-10-CM | POA: Diagnosis not present

## 2016-11-11 DIAGNOSIS — Z794 Long term (current) use of insulin: Secondary | ICD-10-CM | POA: Diagnosis not present

## 2016-11-11 DIAGNOSIS — E039 Hypothyroidism, unspecified: Secondary | ICD-10-CM | POA: Diagnosis not present

## 2016-11-11 DIAGNOSIS — I4891 Unspecified atrial fibrillation: Secondary | ICD-10-CM | POA: Diagnosis not present

## 2016-11-11 DIAGNOSIS — Z9981 Dependence on supplemental oxygen: Secondary | ICD-10-CM | POA: Diagnosis not present

## 2016-11-11 DIAGNOSIS — M6281 Muscle weakness (generalized): Secondary | ICD-10-CM | POA: Diagnosis not present

## 2016-11-11 DIAGNOSIS — E113299 Type 2 diabetes mellitus with mild nonproliferative diabetic retinopathy without macular edema, unspecified eye: Secondary | ICD-10-CM | POA: Diagnosis not present

## 2016-11-11 DIAGNOSIS — M170 Bilateral primary osteoarthritis of knee: Secondary | ICD-10-CM | POA: Diagnosis not present

## 2016-11-11 DIAGNOSIS — I2510 Atherosclerotic heart disease of native coronary artery without angina pectoris: Secondary | ICD-10-CM | POA: Diagnosis not present

## 2016-11-11 DIAGNOSIS — Z7901 Long term (current) use of anticoagulants: Secondary | ICD-10-CM | POA: Diagnosis not present

## 2016-11-11 DIAGNOSIS — R2689 Other abnormalities of gait and mobility: Secondary | ICD-10-CM | POA: Diagnosis not present

## 2016-11-11 DIAGNOSIS — E785 Hyperlipidemia, unspecified: Secondary | ICD-10-CM | POA: Diagnosis not present

## 2016-11-11 DIAGNOSIS — G894 Chronic pain syndrome: Secondary | ICD-10-CM | POA: Diagnosis not present

## 2016-11-11 DIAGNOSIS — Z7902 Long term (current) use of antithrombotics/antiplatelets: Secondary | ICD-10-CM | POA: Diagnosis not present

## 2016-11-11 DIAGNOSIS — D509 Iron deficiency anemia, unspecified: Secondary | ICD-10-CM | POA: Diagnosis not present

## 2016-11-11 DIAGNOSIS — G4733 Obstructive sleep apnea (adult) (pediatric): Secondary | ICD-10-CM | POA: Diagnosis not present

## 2016-11-11 DIAGNOSIS — G4700 Insomnia, unspecified: Secondary | ICD-10-CM | POA: Diagnosis not present

## 2016-11-11 DIAGNOSIS — G2581 Restless legs syndrome: Secondary | ICD-10-CM | POA: Diagnosis not present

## 2016-11-12 DIAGNOSIS — Z7902 Long term (current) use of antithrombotics/antiplatelets: Secondary | ICD-10-CM | POA: Diagnosis not present

## 2016-11-12 DIAGNOSIS — E559 Vitamin D deficiency, unspecified: Secondary | ICD-10-CM | POA: Diagnosis not present

## 2016-11-12 DIAGNOSIS — G894 Chronic pain syndrome: Secondary | ICD-10-CM | POA: Diagnosis not present

## 2016-11-12 DIAGNOSIS — G2581 Restless legs syndrome: Secondary | ICD-10-CM | POA: Diagnosis not present

## 2016-11-12 DIAGNOSIS — Z794 Long term (current) use of insulin: Secondary | ICD-10-CM | POA: Diagnosis not present

## 2016-11-12 DIAGNOSIS — M170 Bilateral primary osteoarthritis of knee: Secondary | ICD-10-CM | POA: Diagnosis not present

## 2016-11-12 DIAGNOSIS — Z9981 Dependence on supplemental oxygen: Secondary | ICD-10-CM | POA: Diagnosis not present

## 2016-11-12 DIAGNOSIS — Z7901 Long term (current) use of anticoagulants: Secondary | ICD-10-CM | POA: Diagnosis not present

## 2016-11-12 DIAGNOSIS — E113299 Type 2 diabetes mellitus with mild nonproliferative diabetic retinopathy without macular edema, unspecified eye: Secondary | ICD-10-CM | POA: Diagnosis not present

## 2016-11-12 DIAGNOSIS — D509 Iron deficiency anemia, unspecified: Secondary | ICD-10-CM | POA: Diagnosis not present

## 2016-11-12 DIAGNOSIS — R2689 Other abnormalities of gait and mobility: Secondary | ICD-10-CM | POA: Diagnosis not present

## 2016-11-12 DIAGNOSIS — E039 Hypothyroidism, unspecified: Secondary | ICD-10-CM | POA: Diagnosis not present

## 2016-11-12 DIAGNOSIS — G4733 Obstructive sleep apnea (adult) (pediatric): Secondary | ICD-10-CM | POA: Diagnosis not present

## 2016-11-12 DIAGNOSIS — G4700 Insomnia, unspecified: Secondary | ICD-10-CM | POA: Diagnosis not present

## 2016-11-12 DIAGNOSIS — M6281 Muscle weakness (generalized): Secondary | ICD-10-CM | POA: Diagnosis not present

## 2016-11-12 DIAGNOSIS — I2510 Atherosclerotic heart disease of native coronary artery without angina pectoris: Secondary | ICD-10-CM | POA: Diagnosis not present

## 2016-11-12 DIAGNOSIS — E785 Hyperlipidemia, unspecified: Secondary | ICD-10-CM | POA: Diagnosis not present

## 2016-11-12 DIAGNOSIS — I10 Essential (primary) hypertension: Secondary | ICD-10-CM | POA: Diagnosis not present

## 2016-11-12 DIAGNOSIS — I4891 Unspecified atrial fibrillation: Secondary | ICD-10-CM | POA: Diagnosis not present

## 2016-11-13 ENCOUNTER — Other Ambulatory Visit: Payer: Self-pay | Admitting: *Deleted

## 2016-11-13 ENCOUNTER — Telehealth: Payer: Self-pay | Admitting: Family Medicine

## 2016-11-13 DIAGNOSIS — E785 Hyperlipidemia, unspecified: Secondary | ICD-10-CM | POA: Diagnosis not present

## 2016-11-13 DIAGNOSIS — Z7902 Long term (current) use of antithrombotics/antiplatelets: Secondary | ICD-10-CM | POA: Diagnosis not present

## 2016-11-13 DIAGNOSIS — G894 Chronic pain syndrome: Secondary | ICD-10-CM | POA: Diagnosis not present

## 2016-11-13 DIAGNOSIS — Z7901 Long term (current) use of anticoagulants: Secondary | ICD-10-CM | POA: Diagnosis not present

## 2016-11-13 DIAGNOSIS — Z9981 Dependence on supplemental oxygen: Secondary | ICD-10-CM | POA: Diagnosis not present

## 2016-11-13 DIAGNOSIS — I10 Essential (primary) hypertension: Secondary | ICD-10-CM | POA: Diagnosis not present

## 2016-11-13 DIAGNOSIS — G4733 Obstructive sleep apnea (adult) (pediatric): Secondary | ICD-10-CM | POA: Diagnosis not present

## 2016-11-13 DIAGNOSIS — D509 Iron deficiency anemia, unspecified: Secondary | ICD-10-CM | POA: Diagnosis not present

## 2016-11-13 DIAGNOSIS — I4891 Unspecified atrial fibrillation: Secondary | ICD-10-CM | POA: Diagnosis not present

## 2016-11-13 DIAGNOSIS — R2689 Other abnormalities of gait and mobility: Secondary | ICD-10-CM | POA: Diagnosis not present

## 2016-11-13 DIAGNOSIS — E039 Hypothyroidism, unspecified: Secondary | ICD-10-CM | POA: Diagnosis not present

## 2016-11-13 DIAGNOSIS — I2510 Atherosclerotic heart disease of native coronary artery without angina pectoris: Secondary | ICD-10-CM | POA: Diagnosis not present

## 2016-11-13 DIAGNOSIS — Z794 Long term (current) use of insulin: Secondary | ICD-10-CM | POA: Diagnosis not present

## 2016-11-13 DIAGNOSIS — E559 Vitamin D deficiency, unspecified: Secondary | ICD-10-CM | POA: Diagnosis not present

## 2016-11-13 DIAGNOSIS — M170 Bilateral primary osteoarthritis of knee: Secondary | ICD-10-CM | POA: Diagnosis not present

## 2016-11-13 DIAGNOSIS — M6281 Muscle weakness (generalized): Secondary | ICD-10-CM | POA: Diagnosis not present

## 2016-11-13 DIAGNOSIS — G2581 Restless legs syndrome: Secondary | ICD-10-CM | POA: Diagnosis not present

## 2016-11-13 DIAGNOSIS — G4700 Insomnia, unspecified: Secondary | ICD-10-CM | POA: Diagnosis not present

## 2016-11-13 DIAGNOSIS — E113299 Type 2 diabetes mellitus with mild nonproliferative diabetic retinopathy without macular edema, unspecified eye: Secondary | ICD-10-CM | POA: Diagnosis not present

## 2016-11-13 NOTE — Telephone Encounter (Signed)
Refills on lancets and BD pen needles placed.

## 2016-11-13 NOTE — Telephone Encounter (Signed)
Attempted to return patients call- no answer/ no vm.

## 2016-11-16 DIAGNOSIS — M6281 Muscle weakness (generalized): Secondary | ICD-10-CM | POA: Diagnosis not present

## 2016-11-16 DIAGNOSIS — E785 Hyperlipidemia, unspecified: Secondary | ICD-10-CM | POA: Diagnosis not present

## 2016-11-16 DIAGNOSIS — G894 Chronic pain syndrome: Secondary | ICD-10-CM | POA: Diagnosis not present

## 2016-11-16 DIAGNOSIS — D509 Iron deficiency anemia, unspecified: Secondary | ICD-10-CM | POA: Diagnosis not present

## 2016-11-16 DIAGNOSIS — M170 Bilateral primary osteoarthritis of knee: Secondary | ICD-10-CM | POA: Diagnosis not present

## 2016-11-16 DIAGNOSIS — I10 Essential (primary) hypertension: Secondary | ICD-10-CM | POA: Diagnosis not present

## 2016-11-16 DIAGNOSIS — I2510 Atherosclerotic heart disease of native coronary artery without angina pectoris: Secondary | ICD-10-CM | POA: Diagnosis not present

## 2016-11-16 DIAGNOSIS — Z794 Long term (current) use of insulin: Secondary | ICD-10-CM | POA: Diagnosis not present

## 2016-11-16 DIAGNOSIS — Z7902 Long term (current) use of antithrombotics/antiplatelets: Secondary | ICD-10-CM | POA: Diagnosis not present

## 2016-11-16 DIAGNOSIS — E113299 Type 2 diabetes mellitus with mild nonproliferative diabetic retinopathy without macular edema, unspecified eye: Secondary | ICD-10-CM | POA: Diagnosis not present

## 2016-11-16 DIAGNOSIS — I4891 Unspecified atrial fibrillation: Secondary | ICD-10-CM | POA: Diagnosis not present

## 2016-11-16 DIAGNOSIS — G4700 Insomnia, unspecified: Secondary | ICD-10-CM | POA: Diagnosis not present

## 2016-11-16 DIAGNOSIS — E039 Hypothyroidism, unspecified: Secondary | ICD-10-CM | POA: Diagnosis not present

## 2016-11-16 DIAGNOSIS — E559 Vitamin D deficiency, unspecified: Secondary | ICD-10-CM | POA: Diagnosis not present

## 2016-11-16 DIAGNOSIS — G2581 Restless legs syndrome: Secondary | ICD-10-CM | POA: Diagnosis not present

## 2016-11-16 DIAGNOSIS — R2689 Other abnormalities of gait and mobility: Secondary | ICD-10-CM | POA: Diagnosis not present

## 2016-11-16 DIAGNOSIS — G4733 Obstructive sleep apnea (adult) (pediatric): Secondary | ICD-10-CM | POA: Diagnosis not present

## 2016-11-16 DIAGNOSIS — Z9981 Dependence on supplemental oxygen: Secondary | ICD-10-CM | POA: Diagnosis not present

## 2016-11-16 DIAGNOSIS — Z7901 Long term (current) use of anticoagulants: Secondary | ICD-10-CM | POA: Diagnosis not present

## 2016-11-17 ENCOUNTER — Telehealth: Payer: Self-pay | Admitting: *Deleted

## 2016-11-17 DIAGNOSIS — I998 Other disorder of circulatory system: Secondary | ICD-10-CM | POA: Diagnosis not present

## 2016-11-17 DIAGNOSIS — E119 Type 2 diabetes mellitus without complications: Secondary | ICD-10-CM | POA: Diagnosis not present

## 2016-11-17 DIAGNOSIS — M170 Bilateral primary osteoarthritis of knee: Secondary | ICD-10-CM | POA: Diagnosis not present

## 2016-11-17 DIAGNOSIS — Z8249 Family history of ischemic heart disease and other diseases of the circulatory system: Secondary | ICD-10-CM | POA: Diagnosis not present

## 2016-11-17 DIAGNOSIS — I517 Cardiomegaly: Secondary | ICD-10-CM | POA: Diagnosis not present

## 2016-11-17 DIAGNOSIS — J810 Acute pulmonary edema: Secondary | ICD-10-CM | POA: Diagnosis not present

## 2016-11-17 DIAGNOSIS — M6281 Muscle weakness (generalized): Secondary | ICD-10-CM | POA: Diagnosis not present

## 2016-11-17 DIAGNOSIS — Z885 Allergy status to narcotic agent status: Secondary | ICD-10-CM | POA: Diagnosis not present

## 2016-11-17 DIAGNOSIS — I358 Other nonrheumatic aortic valve disorders: Secondary | ICD-10-CM | POA: Diagnosis not present

## 2016-11-17 DIAGNOSIS — R0602 Shortness of breath: Secondary | ICD-10-CM | POA: Diagnosis not present

## 2016-11-17 DIAGNOSIS — E1165 Type 2 diabetes mellitus with hyperglycemia: Secondary | ICD-10-CM | POA: Diagnosis not present

## 2016-11-17 DIAGNOSIS — R531 Weakness: Secondary | ICD-10-CM | POA: Diagnosis not present

## 2016-11-17 DIAGNOSIS — E8770 Fluid overload, unspecified: Secondary | ICD-10-CM | POA: Diagnosis not present

## 2016-11-17 DIAGNOSIS — Z9981 Dependence on supplemental oxygen: Secondary | ICD-10-CM | POA: Diagnosis not present

## 2016-11-17 DIAGNOSIS — R918 Other nonspecific abnormal finding of lung field: Secondary | ICD-10-CM | POA: Diagnosis not present

## 2016-11-17 DIAGNOSIS — D509 Iron deficiency anemia, unspecified: Secondary | ICD-10-CM | POA: Diagnosis not present

## 2016-11-17 DIAGNOSIS — I509 Heart failure, unspecified: Secondary | ICD-10-CM | POA: Diagnosis not present

## 2016-11-17 DIAGNOSIS — Z9119 Patient's noncompliance with other medical treatment and regimen: Secondary | ICD-10-CM | POA: Diagnosis not present

## 2016-11-17 DIAGNOSIS — E559 Vitamin D deficiency, unspecified: Secondary | ICD-10-CM | POA: Diagnosis not present

## 2016-11-17 DIAGNOSIS — J90 Pleural effusion, not elsewhere classified: Secondary | ICD-10-CM | POA: Diagnosis not present

## 2016-11-17 DIAGNOSIS — I4891 Unspecified atrial fibrillation: Secondary | ICD-10-CM | POA: Diagnosis not present

## 2016-11-17 DIAGNOSIS — R9431 Abnormal electrocardiogram [ECG] [EKG]: Secondary | ICD-10-CM | POA: Diagnosis not present

## 2016-11-17 DIAGNOSIS — Z880 Allergy status to penicillin: Secondary | ICD-10-CM | POA: Diagnosis not present

## 2016-11-17 DIAGNOSIS — I10 Essential (primary) hypertension: Secondary | ICD-10-CM | POA: Diagnosis not present

## 2016-11-17 DIAGNOSIS — G8929 Other chronic pain: Secondary | ICD-10-CM | POA: Diagnosis not present

## 2016-11-17 DIAGNOSIS — Z7902 Long term (current) use of antithrombotics/antiplatelets: Secondary | ICD-10-CM | POA: Diagnosis not present

## 2016-11-17 DIAGNOSIS — Z87891 Personal history of nicotine dependence: Secondary | ICD-10-CM | POA: Diagnosis not present

## 2016-11-17 DIAGNOSIS — E279 Disorder of adrenal gland, unspecified: Secondary | ICD-10-CM | POA: Diagnosis not present

## 2016-11-17 DIAGNOSIS — Z7982 Long term (current) use of aspirin: Secondary | ICD-10-CM | POA: Diagnosis not present

## 2016-11-17 DIAGNOSIS — Z794 Long term (current) use of insulin: Secondary | ICD-10-CM | POA: Diagnosis not present

## 2016-11-17 DIAGNOSIS — E785 Hyperlipidemia, unspecified: Secondary | ICD-10-CM | POA: Diagnosis not present

## 2016-11-17 DIAGNOSIS — I270 Primary pulmonary hypertension: Secondary | ICD-10-CM | POA: Diagnosis not present

## 2016-11-17 DIAGNOSIS — G2581 Restless legs syndrome: Secondary | ICD-10-CM | POA: Diagnosis not present

## 2016-11-17 DIAGNOSIS — E039 Hypothyroidism, unspecified: Secondary | ICD-10-CM | POA: Diagnosis not present

## 2016-11-17 DIAGNOSIS — R2689 Other abnormalities of gait and mobility: Secondary | ICD-10-CM | POA: Diagnosis not present

## 2016-11-17 DIAGNOSIS — Z7901 Long term (current) use of anticoagulants: Secondary | ICD-10-CM | POA: Diagnosis not present

## 2016-11-17 DIAGNOSIS — G4733 Obstructive sleep apnea (adult) (pediatric): Secondary | ICD-10-CM | POA: Diagnosis not present

## 2016-11-17 DIAGNOSIS — E113299 Type 2 diabetes mellitus with mild nonproliferative diabetic retinopathy without macular edema, unspecified eye: Secondary | ICD-10-CM | POA: Diagnosis not present

## 2016-11-17 DIAGNOSIS — R0682 Tachypnea, not elsewhere classified: Secondary | ICD-10-CM | POA: Diagnosis not present

## 2016-11-17 DIAGNOSIS — I480 Paroxysmal atrial fibrillation: Secondary | ICD-10-CM | POA: Diagnosis not present

## 2016-11-17 DIAGNOSIS — G9340 Encephalopathy, unspecified: Secondary | ICD-10-CM | POA: Diagnosis not present

## 2016-11-17 DIAGNOSIS — G894 Chronic pain syndrome: Secondary | ICD-10-CM | POA: Diagnosis not present

## 2016-11-17 DIAGNOSIS — I2510 Atherosclerotic heart disease of native coronary artery without angina pectoris: Secondary | ICD-10-CM | POA: Diagnosis not present

## 2016-11-17 DIAGNOSIS — R410 Disorientation, unspecified: Secondary | ICD-10-CM | POA: Diagnosis not present

## 2016-11-17 DIAGNOSIS — G4700 Insomnia, unspecified: Secondary | ICD-10-CM | POA: Diagnosis not present

## 2016-11-17 DIAGNOSIS — I471 Supraventricular tachycardia: Secondary | ICD-10-CM | POA: Diagnosis not present

## 2016-11-17 DIAGNOSIS — I959 Hypotension, unspecified: Secondary | ICD-10-CM | POA: Diagnosis not present

## 2016-11-17 DIAGNOSIS — I110 Hypertensive heart disease with heart failure: Secondary | ICD-10-CM | POA: Diagnosis not present

## 2016-11-17 NOTE — Telephone Encounter (Signed)
FYI HHN with [facility] with pt for home visit Resting heart rate was 152 Unable to get BP 911 was called

## 2016-11-18 DIAGNOSIS — I480 Paroxysmal atrial fibrillation: Secondary | ICD-10-CM | POA: Diagnosis not present

## 2016-11-18 DIAGNOSIS — I517 Cardiomegaly: Secondary | ICD-10-CM | POA: Diagnosis not present

## 2016-11-18 DIAGNOSIS — I270 Primary pulmonary hypertension: Secondary | ICD-10-CM | POA: Diagnosis not present

## 2016-11-18 DIAGNOSIS — I358 Other nonrheumatic aortic valve disorders: Secondary | ICD-10-CM | POA: Diagnosis not present

## 2016-11-23 ENCOUNTER — Ambulatory Visit (INDEPENDENT_AMBULATORY_CARE_PROVIDER_SITE_OTHER): Payer: Medicare Other | Admitting: Family Medicine

## 2016-11-23 ENCOUNTER — Encounter: Payer: Self-pay | Admitting: Family Medicine

## 2016-11-23 VITALS — BP 103/65 | HR 146 | Temp 98.3°F | Wt 282.0 lb

## 2016-11-23 DIAGNOSIS — E785 Hyperlipidemia, unspecified: Secondary | ICD-10-CM | POA: Diagnosis not present

## 2016-11-23 DIAGNOSIS — E1169 Type 2 diabetes mellitus with other specified complication: Secondary | ICD-10-CM | POA: Diagnosis not present

## 2016-11-23 DIAGNOSIS — F331 Major depressive disorder, recurrent, moderate: Secondary | ICD-10-CM

## 2016-11-23 DIAGNOSIS — G894 Chronic pain syndrome: Secondary | ICD-10-CM | POA: Diagnosis not present

## 2016-11-23 MED ORDER — DONEPEZIL HCL 5 MG PO TABS: 5.0000 mg | ORAL_TABLET | Freq: Every day | ORAL | 1 refills | Status: DC

## 2016-11-23 MED ORDER — DULOXETINE HCL 30 MG PO CPEP: 90.0000 mg | ORAL_CAPSULE | Freq: Every day | ORAL | 1 refills | Status: DC

## 2016-11-23 NOTE — Assessment & Plan Note (Signed)
Improved but still uncontrolled, it appears that is mostly postprandial that out of control, will increase NovoLog preprandial to 12 units 3 times a day.

## 2016-11-23 NOTE — Progress Notes (Signed)
BP 103/65   Pulse (!) 146   Temp 98.3 F (36.8 C) (Oral)    Subjective:    Patient ID: [name], female    DOB: 1940/09/21, 76 y.o.   MRN: [id number]  HPI: [name] is a 76 y.o. female presenting on 11/23/2016 for Diabetes (4 week followup) and Discuss med (she quit taking this, but son wants [place] to recommend she start it again)   HPI Type 2 diabetes mellitus Patient comes in today for recheck of his diabetes. Patient has been currently taking Levemir 22 units twice a day and insulin NovoLog 6 units 3 times a day with meals, over the past 4 weeks her blood sugars have improved on this regimen but are still in the low 200s. Patient is not currently on an ACE inhibitor. Patient has not seen an ophthalmologist this year.   Anxiety and depression recheck and chronic pain Patient has chronic neuropathic pain and has been having issues with anxiety and depression. She has a lot of respiratory issues including sleep apnea and is currently on oxygen sat some concerns about safely being able to prescribe narcotics instead we'll do a referral to pain management for her. She is currently on Cymbalta and says it is helping her anxiety and depression and she is feeling better.  Relevant past medical, surgical, family and social history reviewed and updated as indicated. Interim medical history since our last visit reviewed. Allergies and medications reviewed and updated.  Review of Systems  Constitutional: Negative for chills and fever.  Eyes: Positive for visual disturbance.  Respiratory: Negative for chest tightness and shortness of breath.   Cardiovascular: Negative for chest pain and leg swelling.  Musculoskeletal: Negative for back pain and gait problem.  Skin: Negative for rash.  Neurological: Positive for weakness and numbness. Negative for light-headedness and headaches.  Psychiatric/Behavioral: Positive for decreased concentration, dysphoric mood and sleep disturbance.  Negative for agitation, behavioral problems, self-injury and suicidal ideas. The patient is nervous/anxious.   All other systems reviewed and are negative.   Per HPI unless specifically indicated above     Objective:    BP 103/65   Pulse (!) 146   Temp 98.3 F (36.8 C) (Oral)   Wt Readings from Last 3 Encounters:  10/31/15 275 lb (124.7 kg)  10/03/15 276 lb (125.2 kg)  07/24/15 270 lb (122.5 kg)    Physical Exam  Constitutional: She is oriented to person, place, and time. She appears well-developed and well-nourished. No distress.  Eyes: Conjunctivae are normal.  Cardiovascular: Normal rate, regular rhythm, normal heart sounds and intact distal pulses.   No murmur heard. Pulmonary/Chest: Effort normal and breath sounds normal. No respiratory distress. She has no wheezes.  Musculoskeletal: Normal range of motion.  Neurological: She is alert and oriented to person, place, and time. Coordination normal.  Skin: Skin is warm and dry. No rash noted. She is not diaphoretic.  Psychiatric: Her behavior is normal. Judgment normal. Her mood appears anxious. She exhibits a depressed mood. She expresses no suicidal ideation. She expresses no suicidal plans.  Nursing note and vitals reviewed.     Assessment & Plan:   Problem List Items Addressed This Visit      Endocrine   Type 2 diabetes mellitus with hyperlipidemia (HCC) - Primary    Improved but still uncontrolled, it appears that is mostly postprandial that out of control, will increase NovoLog preprandial to 12 units 3 times a day.  Other   Depression   Relevant Medications   DULoxetine (CYMBALTA) 30 MG capsule    Other Visit Diagnoses    Chronic pain syndrome       Relevant Orders   Ambulatory referral to Pain Clinic       Follow up plan: Return in about 4 weeks (around 12/21/2016), or if symptoms worsen or fail to improve, for Recheck diabetes in 4 weeks.  Counseling provided for all of the vaccine components No  orders of the defined types were placed in this encounter.   Arville CareJoshua Dettinger, MD [facility] 11/23/2016, 2:29 PM

## 2016-11-24 NOTE — Telephone Encounter (Signed)
Pt seen 11/23/16 by Dr. Louanne Skyeettinger; Issues addressed

## 2016-11-27 DIAGNOSIS — G894 Chronic pain syndrome: Secondary | ICD-10-CM | POA: Diagnosis not present

## 2016-11-27 DIAGNOSIS — Z79891 Long term (current) use of opiate analgesic: Secondary | ICD-10-CM | POA: Diagnosis not present

## 2016-11-27 DIAGNOSIS — M5136 Other intervertebral disc degeneration, lumbar region: Secondary | ICD-10-CM | POA: Diagnosis not present

## 2016-12-11 DIAGNOSIS — R000 Tachycardia, unspecified: Secondary | ICD-10-CM | POA: Diagnosis not present

## 2016-12-11 DIAGNOSIS — Z880 Allergy status to penicillin: Secondary | ICD-10-CM | POA: Diagnosis not present

## 2016-12-11 DIAGNOSIS — Z7902 Long term (current) use of antithrombotics/antiplatelets: Secondary | ICD-10-CM | POA: Diagnosis not present

## 2016-12-11 DIAGNOSIS — Z7901 Long term (current) use of anticoagulants: Secondary | ICD-10-CM | POA: Diagnosis not present

## 2016-12-11 DIAGNOSIS — Z885 Allergy status to narcotic agent status: Secondary | ICD-10-CM | POA: Diagnosis not present

## 2016-12-11 DIAGNOSIS — R509 Fever, unspecified: Secondary | ICD-10-CM | POA: Diagnosis not present

## 2016-12-11 DIAGNOSIS — R350 Frequency of micturition: Secondary | ICD-10-CM | POA: Diagnosis not present

## 2016-12-11 DIAGNOSIS — M25551 Pain in right hip: Secondary | ICD-10-CM | POA: Diagnosis not present

## 2016-12-11 DIAGNOSIS — G8929 Other chronic pain: Secondary | ICD-10-CM | POA: Diagnosis not present

## 2016-12-11 DIAGNOSIS — R0602 Shortness of breath: Secondary | ICD-10-CM | POA: Diagnosis not present

## 2016-12-11 DIAGNOSIS — Z7982 Long term (current) use of aspirin: Secondary | ICD-10-CM | POA: Diagnosis not present

## 2016-12-11 DIAGNOSIS — R0600 Dyspnea, unspecified: Secondary | ICD-10-CM | POA: Diagnosis not present

## 2016-12-11 DIAGNOSIS — K5901 Slow transit constipation: Secondary | ICD-10-CM | POA: Diagnosis not present

## 2016-12-11 DIAGNOSIS — K5900 Constipation, unspecified: Secondary | ICD-10-CM | POA: Diagnosis not present

## 2016-12-11 DIAGNOSIS — E785 Hyperlipidemia, unspecified: Secondary | ICD-10-CM | POA: Diagnosis not present

## 2016-12-11 DIAGNOSIS — I10 Essential (primary) hypertension: Secondary | ICD-10-CM | POA: Diagnosis not present

## 2016-12-11 DIAGNOSIS — J90 Pleural effusion, not elsewhere classified: Secondary | ICD-10-CM | POA: Diagnosis not present

## 2016-12-11 DIAGNOSIS — I471 Supraventricular tachycardia: Secondary | ICD-10-CM | POA: Diagnosis not present

## 2016-12-11 DIAGNOSIS — E119 Type 2 diabetes mellitus without complications: Secondary | ICD-10-CM | POA: Diagnosis not present

## 2016-12-11 DIAGNOSIS — R0789 Other chest pain: Secondary | ICD-10-CM | POA: Diagnosis not present

## 2016-12-11 DIAGNOSIS — Z9049 Acquired absence of other specified parts of digestive tract: Secondary | ICD-10-CM | POA: Diagnosis not present

## 2016-12-11 DIAGNOSIS — Z9981 Dependence on supplemental oxygen: Secondary | ICD-10-CM | POA: Diagnosis not present

## 2016-12-11 DIAGNOSIS — Z79899 Other long term (current) drug therapy: Secondary | ICD-10-CM | POA: Diagnosis not present

## 2016-12-11 DIAGNOSIS — R7989 Other specified abnormal findings of blood chemistry: Secondary | ICD-10-CM | POA: Diagnosis not present

## 2016-12-11 DIAGNOSIS — R570 Cardiogenic shock: Secondary | ICD-10-CM | POA: Diagnosis not present

## 2016-12-11 DIAGNOSIS — Z794 Long term (current) use of insulin: Secondary | ICD-10-CM | POA: Diagnosis not present

## 2016-12-11 DIAGNOSIS — I480 Paroxysmal atrial fibrillation: Secondary | ICD-10-CM | POA: Diagnosis not present

## 2016-12-11 DIAGNOSIS — I2510 Atherosclerotic heart disease of native coronary artery without angina pectoris: Secondary | ICD-10-CM | POA: Diagnosis not present

## 2016-12-11 DIAGNOSIS — I4891 Unspecified atrial fibrillation: Secondary | ICD-10-CM | POA: Diagnosis not present

## 2016-12-11 DIAGNOSIS — R0989 Other specified symptoms and signs involving the circulatory and respiratory systems: Secondary | ICD-10-CM | POA: Diagnosis not present

## 2016-12-11 DIAGNOSIS — Z87891 Personal history of nicotine dependence: Secondary | ICD-10-CM | POA: Diagnosis not present

## 2016-12-11 DIAGNOSIS — R918 Other nonspecific abnormal finding of lung field: Secondary | ICD-10-CM | POA: Diagnosis not present

## 2016-12-11 DIAGNOSIS — E039 Hypothyroidism, unspecified: Secondary | ICD-10-CM | POA: Diagnosis not present

## 2016-12-11 DIAGNOSIS — I248 Other forms of acute ischemic heart disease: Secondary | ICD-10-CM | POA: Diagnosis not present

## 2016-12-11 DIAGNOSIS — I959 Hypotension, unspecified: Secondary | ICD-10-CM | POA: Diagnosis not present

## 2016-12-11 DIAGNOSIS — R079 Chest pain, unspecified: Secondary | ICD-10-CM | POA: Diagnosis not present

## 2016-12-11 DIAGNOSIS — R002 Palpitations: Secondary | ICD-10-CM | POA: Diagnosis not present

## 2016-12-11 DIAGNOSIS — R05 Cough: Secondary | ICD-10-CM | POA: Diagnosis not present

## 2016-12-14 ENCOUNTER — Other Ambulatory Visit: Payer: Self-pay | Admitting: Family Medicine

## 2016-12-21 ENCOUNTER — Ambulatory Visit (INDEPENDENT_AMBULATORY_CARE_PROVIDER_SITE_OTHER): Payer: Medicare Other | Admitting: Family Medicine

## 2016-12-21 ENCOUNTER — Encounter: Payer: Self-pay | Admitting: Family Medicine

## 2016-12-21 VITALS — BP 115/56 | HR 60 | Temp 97.7°F | Ht 64.0 in | Wt 282.0 lb

## 2016-12-21 DIAGNOSIS — I471 Supraventricular tachycardia: Secondary | ICD-10-CM | POA: Diagnosis not present

## 2016-12-21 DIAGNOSIS — G4700 Insomnia, unspecified: Secondary | ICD-10-CM | POA: Diagnosis not present

## 2016-12-21 DIAGNOSIS — J810 Acute pulmonary edema: Secondary | ICD-10-CM | POA: Diagnosis not present

## 2016-12-21 MED ORDER — TRAZODONE HCL 50 MG PO TABS: 25.0000 mg | ORAL_TABLET | Freq: Every evening | ORAL | 3 refills | Status: AC | PRN

## 2016-12-21 NOTE — Progress Notes (Signed)
BP (!) 115/56   Pulse 60   Temp 97.7 F (36.5 C) (Oral)   Ht 5\' 4"  (1.626 m)   Wt 282 lb (127.9 kg)   BMI 48.41 kg/m    Subjective:    Patient ID: [name], female    DOB: [birth year], 76 y.o.   MRN: [id number]  HPI: [name] is a 76 y.o. female presenting on 12/21/2016 for Hospital followup   HPI Hospital follow-up for pulmonary edema and SVT Patient was having lightheadedness and dizziness and the ambulance was called and she was taken to the hospital. She was found to the hospitalization to have pulmonary edema and SVT as the likely cause of her presyncopal episodes. Patient was also having chest pain and shortness of breath and fast heart rate palpitations when she presented to the hospital. She had sweating and this was shortly after waking up. She was eating and feeling short of breath despite being on 3 L of oxygen. Her chest tightness was also occurring. Patient at that time was found in SVT and given twice adenosine and her metoprolol was increased cardiology saw the patient in the facility. She had mildly elevated troponins at that time. Patient says since leaving the hospital she hasn't had no further issues with this and has been doing very well. She denies any further chest pain or shortness of breath or breathing issues. She has been on her 3 L nasal cannula and has been doing well on it. Patient has had insomnia since leaving the hospital and would like to discuss options to try and get her back on track with a normal sleep pattern.  Relevant past medical, surgical, family and social history reviewed and updated as indicated. Interim medical history since our last visit reviewed. Allergies and medications reviewed and updated.  Review of Systems  Constitutional: Negative for chills and fever.  HENT: Negative for congestion, ear discharge and ear pain.   Eyes: Negative for redness and visual disturbance.  Respiratory: Positive for chest tightness and  shortness of breath. Negative for wheezing.   Cardiovascular: Positive for chest pain and palpitations. Negative for leg swelling.  Musculoskeletal: Negative for back pain and gait problem.  Skin: Negative for rash.  Neurological: Negative for light-headedness and headaches.  Psychiatric/Behavioral: Negative for agitation and behavioral problems.  All other systems reviewed and are negative.   Per HPI unless specifically indicated above        Objective:    BP (!) 115/56   Pulse 60   Temp 97.7 F (36.5 C) (Oral)   Ht 5\' 4"  (1.626 m)   Wt 282 lb (127.9 kg)   BMI 48.41 kg/m   Wt Readings from Last 3 Encounters:  12/21/16 282 lb (127.9 kg)  11/23/16 282 lb (127.9 kg)  10/31/15 275 lb (124.7 kg)    Physical Exam  Constitutional: She is oriented to person, place, and time. She appears well-developed and well-nourished. No distress.  Morbidly obese patient  Eyes: Conjunctivae are normal.  Neck: Neck supple. No thyromegaly present.  Cardiovascular: Normal rate, regular rhythm, normal heart sounds and intact distal pulses.   No murmur heard. Pulmonary/Chest: Effort normal and breath sounds normal. No respiratory distress. She has no wheezes. She has no rales.  Musculoskeletal: Normal range of motion. She exhibits no edema or tenderness.  Lymphadenopathy:    She has no cervical adenopathy.  Neurological: She is alert and oriented to person, place, and time. Coordination normal.  Skin: Skin is warm and  dry. No rash noted. She is not diaphoretic.  Psychiatric: She has a normal mood and affect. Her behavior is normal.  Nursing note and vitals reviewed.      Assessment & Plan:   Problem List Items Addressed This Visit      Cardiovascular and Mediastinum   SVT (supraventricular tachycardia) (HCC) - Primary   Relevant Orders   Ambulatory referral to Cardiology    Other Visit Diagnoses    Acute pulmonary edema (HCC)       Insomnia, unspecified type       Relevant  Medications   traZODone (DESYREL) 50 MG tablet      Follow up plan: Return if symptoms worsen or fail to improve.  Counseling provided for all of the vaccine components No orders of the defined types were placed in this encounter.   Arville CareJoshua Haylin Camilli, MD [facility] 12/21/2016, 2:30 PM

## 2016-12-25 ENCOUNTER — Other Ambulatory Visit: Payer: Self-pay | Admitting: Family Medicine

## 2016-12-31 ENCOUNTER — Other Ambulatory Visit: Payer: Self-pay

## 2016-12-31 ENCOUNTER — Other Ambulatory Visit: Payer: Medicare Other

## 2016-12-31 ENCOUNTER — Telehealth: Payer: Self-pay | Admitting: Family Medicine

## 2016-12-31 DIAGNOSIS — R300 Dysuria: Secondary | ICD-10-CM

## 2016-12-31 LAB — URINALYSIS, COMPLETE
Bilirubin, UA: NEGATIVE
Glucose, UA: NEGATIVE
Ketones, UA: NEGATIVE
Leukocytes, UA: NEGATIVE
NITRITE UA: NEGATIVE
Specific Gravity, UA: 1.02 (ref 1.005–1.030)
Urobilinogen, Ur: 0.2 mg/dL (ref 0.2–1.0)
pH, UA: 7 (ref 5.0–7.5)

## 2016-12-31 LAB — MICROSCOPIC EXAMINATION
BACTERIA UA: NONE SEEN
RENAL EPITHEL UA: NONE SEEN /HPF
WBC, UA: NONE SEEN /hpf (ref 0–?)

## 2016-12-31 NOTE — Telephone Encounter (Signed)
Ok for patient to bring in urine. Order for future orders placed in computer

## 2017-01-04 ENCOUNTER — Other Ambulatory Visit: Payer: Self-pay | Admitting: *Deleted

## 2017-01-04 DIAGNOSIS — M545 Low back pain: Secondary | ICD-10-CM

## 2017-01-04 DIAGNOSIS — I4891 Unspecified atrial fibrillation: Secondary | ICD-10-CM | POA: Diagnosis not present

## 2017-01-04 DIAGNOSIS — N390 Urinary tract infection, site not specified: Secondary | ICD-10-CM

## 2017-01-04 DIAGNOSIS — J9612 Chronic respiratory failure with hypercapnia: Secondary | ICD-10-CM | POA: Diagnosis not present

## 2017-01-04 LAB — URINE CULTURE

## 2017-01-05 NOTE — Telephone Encounter (Signed)
Son states reaction was over 40 years ago of itching at site of injection Informed we would still want to avoid medication as to not having a worse reaction Referral to urology has been placed and will get in touch with pt as soon as appt has been set

## 2017-01-09 ENCOUNTER — Ambulatory Visit (INDEPENDENT_AMBULATORY_CARE_PROVIDER_SITE_OTHER): Payer: Medicare Other | Admitting: Family Medicine

## 2017-01-09 DIAGNOSIS — D509 Iron deficiency anemia, unspecified: Secondary | ICD-10-CM

## 2017-01-09 DIAGNOSIS — I2510 Atherosclerotic heart disease of native coronary artery without angina pectoris: Secondary | ICD-10-CM

## 2017-01-09 DIAGNOSIS — I4891 Unspecified atrial fibrillation: Secondary | ICD-10-CM

## 2017-01-09 DIAGNOSIS — I10 Essential (primary) hypertension: Secondary | ICD-10-CM | POA: Diagnosis not present

## 2017-01-09 DIAGNOSIS — E785 Hyperlipidemia, unspecified: Secondary | ICD-10-CM

## 2017-01-09 DIAGNOSIS — F419 Anxiety disorder, unspecified: Secondary | ICD-10-CM

## 2017-01-09 DIAGNOSIS — F331 Major depressive disorder, recurrent, moderate: Secondary | ICD-10-CM

## 2017-01-09 DIAGNOSIS — M6281 Muscle weakness (generalized): Secondary | ICD-10-CM

## 2017-01-09 DIAGNOSIS — M170 Bilateral primary osteoarthritis of knee: Secondary | ICD-10-CM

## 2017-01-09 DIAGNOSIS — E113299 Type 2 diabetes mellitus with mild nonproliferative diabetic retinopathy without macular edema, unspecified eye: Secondary | ICD-10-CM

## 2017-01-09 DIAGNOSIS — E559 Vitamin D deficiency, unspecified: Secondary | ICD-10-CM

## 2017-01-09 DIAGNOSIS — G4700 Insomnia, unspecified: Secondary | ICD-10-CM

## 2017-01-09 DIAGNOSIS — G2581 Restless legs syndrome: Secondary | ICD-10-CM

## 2017-01-09 DIAGNOSIS — G894 Chronic pain syndrome: Secondary | ICD-10-CM

## 2017-01-09 DIAGNOSIS — F0390 Unspecified dementia without behavioral disturbance: Secondary | ICD-10-CM

## 2017-01-09 DIAGNOSIS — G4733 Obstructive sleep apnea (adult) (pediatric): Secondary | ICD-10-CM

## 2017-01-09 DIAGNOSIS — E039 Hypothyroidism, unspecified: Secondary | ICD-10-CM

## 2017-01-09 DIAGNOSIS — Z794 Long term (current) use of insulin: Secondary | ICD-10-CM

## 2017-01-09 DIAGNOSIS — R2689 Other abnormalities of gait and mobility: Secondary | ICD-10-CM

## 2017-01-11 ENCOUNTER — Telehealth: Payer: Self-pay | Admitting: Family Medicine

## 2017-01-11 MED ORDER — CEFDINIR 300 MG PO CAPS: 300.0000 mg | ORAL_CAPSULE | Freq: Two times a day (BID) | ORAL | 0 refills | Status: AC

## 2017-01-11 NOTE — Telephone Encounter (Signed)
Left a detailed message for Casimiro NeedleMichael - he will call with any questions Med sent in

## 2017-01-11 NOTE — Telephone Encounter (Signed)
Dr Christell ConstantMoore,  I called and spoke with the triage nurse and the pt can not get an earlier appt .  The nurse has her on a wait list for cancellations - but the state that 02/08/17 is a quick appt .  Please address if we need to do any meds in the meantime.

## 2017-01-11 NOTE — Telephone Encounter (Signed)
Please call and see if you can get her in on a more urgent basis

## 2017-01-11 NOTE — Telephone Encounter (Signed)
Start Omnicef 300 mg twice daily for 10 days and make sure the patient drinks plenty of fluids and keep herself well hydrated

## 2017-01-14 ENCOUNTER — Telehealth: Payer: Self-pay | Admitting: Family Medicine

## 2017-01-14 ENCOUNTER — Other Ambulatory Visit: Payer: Self-pay | Admitting: *Deleted

## 2017-01-14 MED ORDER — METOPROLOL TARTRATE 50 MG PO TABS: 50.0000 mg | ORAL_TABLET | Freq: Two times a day (BID) | ORAL | 1 refills | Status: AC

## 2017-01-14 NOTE — Progress Notes (Signed)
Okay to refill Metoprolol 50mg  Per MMM

## 2017-01-20 ENCOUNTER — Other Ambulatory Visit: Payer: Self-pay | Admitting: Family Medicine

## 2017-01-27 ENCOUNTER — Other Ambulatory Visit: Payer: Self-pay | Admitting: Family Medicine

## 2017-01-27 ENCOUNTER — Telehealth: Payer: Self-pay | Admitting: Family Medicine

## 2017-01-27 NOTE — Telephone Encounter (Signed)
[name] aware that patient should have refills on both cymbalta and trazodone

## 2017-02-01 ENCOUNTER — Other Ambulatory Visit: Payer: Medicare Other

## 2017-02-01 ENCOUNTER — Other Ambulatory Visit: Payer: Self-pay | Admitting: *Deleted

## 2017-02-01 DIAGNOSIS — N390 Urinary tract infection, site not specified: Principal | ICD-10-CM

## 2017-02-01 DIAGNOSIS — B9620 Unspecified Escherichia coli [E. coli] as the cause of diseases classified elsewhere: Secondary | ICD-10-CM

## 2017-02-01 MED ORDER — DULOXETINE HCL 30 MG PO CPEP: 90.0000 mg | ORAL_CAPSULE | Freq: Every day | ORAL | 0 refills | Status: AC

## 2017-02-03 ENCOUNTER — Other Ambulatory Visit: Payer: Self-pay | Admitting: *Deleted

## 2017-02-03 ENCOUNTER — Telehealth: Payer: Self-pay | Admitting: Family Medicine

## 2017-02-03 DIAGNOSIS — I4891 Unspecified atrial fibrillation: Secondary | ICD-10-CM | POA: Diagnosis not present

## 2017-02-03 DIAGNOSIS — J9612 Chronic respiratory failure with hypercapnia: Secondary | ICD-10-CM | POA: Diagnosis not present

## 2017-02-03 MED ORDER — CIPROFLOXACIN HCL 500 MG PO TABS: 500.0000 mg | ORAL_TABLET | Freq: Two times a day (BID) | ORAL | 0 refills | Status: DC

## 2017-02-03 NOTE — Telephone Encounter (Signed)
Son aware of results and new medication.

## 2017-02-03 NOTE — Telephone Encounter (Signed)
Urine culture has not been finalized but it does look like it grew some kind of bacteria so send her ciprofloxacin 500 twice a day for 10 days

## 2017-02-03 NOTE — Telephone Encounter (Signed)
Please review and advise on urine results.

## 2017-02-04 LAB — URINE CULTURE

## 2017-02-05 MED ORDER — CEFDINIR 300 MG PO CAPS: 300.0000 mg | ORAL_CAPSULE | Freq: Two times a day (BID) | ORAL | 0 refills | Status: DC

## 2017-02-05 NOTE — Progress Notes (Unsigned)
Son aware per dpr and verbalizes understanding. - rx sent.

## 2017-02-08 ENCOUNTER — Telehealth: Payer: Self-pay | Admitting: Family Medicine

## 2017-02-08 DIAGNOSIS — N3020 Other chronic cystitis without hematuria: Secondary | ICD-10-CM | POA: Diagnosis not present

## 2017-02-08 NOTE — Telephone Encounter (Signed)
-----   Message from Bjorn PippinJohn Wrenn, MD sent at 02/08/2017  2:30 PM EDT ----- I am starting [name] on 25mg  Myrbetriq.   It has some interaction with Cymbalta and Metoprolol and can make them more active.  It may be worthwhile to cut back the Cymbalta and Metoprolol which on the Myrbetriq.   I am also going to give her Cefdinir as a suppressive antibiotic for 3 months.

## 2017-02-08 NOTE — Telephone Encounter (Signed)
Pt's son notified of recommendation Pt has follow up appt on 02/25/2017

## 2017-02-10 DIAGNOSIS — Z9049 Acquired absence of other specified parts of digestive tract: Secondary | ICD-10-CM | POA: Diagnosis not present

## 2017-02-10 DIAGNOSIS — I110 Hypertensive heart disease with heart failure: Secondary | ICD-10-CM | POA: Diagnosis not present

## 2017-02-10 DIAGNOSIS — J90 Pleural effusion, not elsewhere classified: Secondary | ICD-10-CM | POA: Diagnosis not present

## 2017-02-10 DIAGNOSIS — I2510 Atherosclerotic heart disease of native coronary artery without angina pectoris: Secondary | ICD-10-CM | POA: Diagnosis not present

## 2017-02-10 DIAGNOSIS — Z9119 Patient's noncompliance with other medical treatment and regimen: Secondary | ICD-10-CM | POA: Diagnosis not present

## 2017-02-10 DIAGNOSIS — Z9981 Dependence on supplemental oxygen: Secondary | ICD-10-CM | POA: Diagnosis not present

## 2017-02-10 DIAGNOSIS — R278 Other lack of coordination: Secondary | ICD-10-CM | POA: Diagnosis not present

## 2017-02-10 DIAGNOSIS — I5033 Acute on chronic diastolic (congestive) heart failure: Secondary | ICD-10-CM | POA: Diagnosis not present

## 2017-02-10 DIAGNOSIS — R0600 Dyspnea, unspecified: Secondary | ICD-10-CM | POA: Diagnosis not present

## 2017-02-10 DIAGNOSIS — E039 Hypothyroidism, unspecified: Secondary | ICD-10-CM | POA: Diagnosis not present

## 2017-02-10 DIAGNOSIS — J810 Acute pulmonary edema: Secondary | ICD-10-CM | POA: Diagnosis not present

## 2017-02-10 DIAGNOSIS — I517 Cardiomegaly: Secondary | ICD-10-CM | POA: Diagnosis not present

## 2017-02-10 DIAGNOSIS — Z87891 Personal history of nicotine dependence: Secondary | ICD-10-CM | POA: Diagnosis not present

## 2017-02-10 DIAGNOSIS — I21A1 Myocardial infarction type 2: Secondary | ICD-10-CM | POA: Diagnosis not present

## 2017-02-10 DIAGNOSIS — I509 Heart failure, unspecified: Secondary | ICD-10-CM | POA: Diagnosis not present

## 2017-02-10 DIAGNOSIS — R079 Chest pain, unspecified: Secondary | ICD-10-CM | POA: Diagnosis not present

## 2017-02-10 DIAGNOSIS — Z885 Allergy status to narcotic agent status: Secondary | ICD-10-CM | POA: Diagnosis not present

## 2017-02-10 DIAGNOSIS — R001 Bradycardia, unspecified: Secondary | ICD-10-CM | POA: Diagnosis not present

## 2017-02-10 DIAGNOSIS — I471 Supraventricular tachycardia: Secondary | ICD-10-CM | POA: Diagnosis not present

## 2017-02-10 DIAGNOSIS — Z880 Allergy status to penicillin: Secondary | ICD-10-CM | POA: Diagnosis not present

## 2017-02-10 DIAGNOSIS — I480 Paroxysmal atrial fibrillation: Secondary | ICD-10-CM | POA: Diagnosis not present

## 2017-02-10 DIAGNOSIS — I248 Other forms of acute ischemic heart disease: Secondary | ICD-10-CM | POA: Diagnosis not present

## 2017-02-10 DIAGNOSIS — M6281 Muscle weakness (generalized): Secondary | ICD-10-CM | POA: Diagnosis not present

## 2017-02-10 DIAGNOSIS — R7989 Other specified abnormal findings of blood chemistry: Secondary | ICD-10-CM | POA: Diagnosis not present

## 2017-02-10 DIAGNOSIS — E785 Hyperlipidemia, unspecified: Secondary | ICD-10-CM | POA: Diagnosis not present

## 2017-02-10 DIAGNOSIS — E119 Type 2 diabetes mellitus without complications: Secondary | ICD-10-CM | POA: Diagnosis not present

## 2017-02-10 DIAGNOSIS — Z794 Long term (current) use of insulin: Secondary | ICD-10-CM | POA: Diagnosis not present

## 2017-02-10 DIAGNOSIS — J9611 Chronic respiratory failure with hypoxia: Secondary | ICD-10-CM | POA: Diagnosis not present

## 2017-02-10 DIAGNOSIS — R0602 Shortness of breath: Secondary | ICD-10-CM | POA: Diagnosis not present

## 2017-02-10 DIAGNOSIS — Z7409 Other reduced mobility: Secondary | ICD-10-CM | POA: Diagnosis not present

## 2017-02-10 DIAGNOSIS — R2689 Other abnormalities of gait and mobility: Secondary | ICD-10-CM | POA: Diagnosis not present

## 2017-02-15 ENCOUNTER — Encounter: Payer: Self-pay | Admitting: Cardiology

## 2017-02-15 DIAGNOSIS — Z885 Allergy status to narcotic agent status: Secondary | ICD-10-CM | POA: Diagnosis not present

## 2017-02-15 DIAGNOSIS — E1169 Type 2 diabetes mellitus with other specified complication: Secondary | ICD-10-CM | POA: Diagnosis not present

## 2017-02-15 DIAGNOSIS — Z7901 Long term (current) use of anticoagulants: Secondary | ICD-10-CM | POA: Diagnosis not present

## 2017-02-15 DIAGNOSIS — N390 Urinary tract infection, site not specified: Secondary | ICD-10-CM | POA: Diagnosis not present

## 2017-02-15 DIAGNOSIS — I4891 Unspecified atrial fibrillation: Secondary | ICD-10-CM | POA: Diagnosis not present

## 2017-02-15 DIAGNOSIS — I5022 Chronic systolic (congestive) heart failure: Secondary | ICD-10-CM | POA: Diagnosis not present

## 2017-02-15 DIAGNOSIS — E079 Disorder of thyroid, unspecified: Secondary | ICD-10-CM | POA: Diagnosis not present

## 2017-02-15 DIAGNOSIS — I509 Heart failure, unspecified: Secondary | ICD-10-CM | POA: Diagnosis not present

## 2017-02-15 DIAGNOSIS — G478 Other sleep disorders: Secondary | ICD-10-CM | POA: Diagnosis not present

## 2017-02-15 DIAGNOSIS — Z87891 Personal history of nicotine dependence: Secondary | ICD-10-CM | POA: Diagnosis not present

## 2017-02-15 DIAGNOSIS — Z7951 Long term (current) use of inhaled steroids: Secondary | ICD-10-CM | POA: Diagnosis not present

## 2017-02-15 DIAGNOSIS — Z7902 Long term (current) use of antithrombotics/antiplatelets: Secondary | ICD-10-CM | POA: Diagnosis not present

## 2017-02-15 DIAGNOSIS — M549 Dorsalgia, unspecified: Secondary | ICD-10-CM | POA: Diagnosis not present

## 2017-02-15 DIAGNOSIS — Z794 Long term (current) use of insulin: Secondary | ICD-10-CM | POA: Diagnosis not present

## 2017-02-15 DIAGNOSIS — Z9109 Other allergy status, other than to drugs and biological substances: Secondary | ICD-10-CM | POA: Diagnosis not present

## 2017-02-15 DIAGNOSIS — Z79899 Other long term (current) drug therapy: Secondary | ICD-10-CM | POA: Diagnosis not present

## 2017-02-15 DIAGNOSIS — M545 Low back pain: Secondary | ICD-10-CM | POA: Diagnosis not present

## 2017-02-15 DIAGNOSIS — I480 Paroxysmal atrial fibrillation: Secondary | ICD-10-CM | POA: Diagnosis not present

## 2017-02-15 DIAGNOSIS — I5189 Other ill-defined heart diseases: Secondary | ICD-10-CM | POA: Diagnosis not present

## 2017-02-15 DIAGNOSIS — G894 Chronic pain syndrome: Secondary | ICD-10-CM | POA: Diagnosis not present

## 2017-02-15 DIAGNOSIS — R5381 Other malaise: Secondary | ICD-10-CM | POA: Diagnosis not present

## 2017-02-15 DIAGNOSIS — R2689 Other abnormalities of gait and mobility: Secondary | ICD-10-CM | POA: Diagnosis not present

## 2017-02-15 DIAGNOSIS — J9611 Chronic respiratory failure with hypoxia: Secondary | ICD-10-CM | POA: Diagnosis not present

## 2017-02-15 DIAGNOSIS — B372 Candidiasis of skin and nail: Secondary | ICD-10-CM | POA: Diagnosis not present

## 2017-02-15 DIAGNOSIS — M6281 Muscle weakness (generalized): Secondary | ICD-10-CM | POA: Diagnosis not present

## 2017-02-15 DIAGNOSIS — Z7409 Other reduced mobility: Secondary | ICD-10-CM | POA: Diagnosis not present

## 2017-02-15 DIAGNOSIS — I5033 Acute on chronic diastolic (congestive) heart failure: Secondary | ICD-10-CM | POA: Diagnosis not present

## 2017-02-15 DIAGNOSIS — I110 Hypertensive heart disease with heart failure: Secondary | ICD-10-CM | POA: Diagnosis not present

## 2017-02-15 DIAGNOSIS — E119 Type 2 diabetes mellitus without complications: Secondary | ICD-10-CM | POA: Diagnosis not present

## 2017-02-15 DIAGNOSIS — I2510 Atherosclerotic heart disease of native coronary artery without angina pectoris: Secondary | ICD-10-CM | POA: Diagnosis not present

## 2017-02-15 DIAGNOSIS — M1990 Unspecified osteoarthritis, unspecified site: Secondary | ICD-10-CM | POA: Diagnosis not present

## 2017-02-15 DIAGNOSIS — R7309 Other abnormal glucose: Secondary | ICD-10-CM | POA: Diagnosis not present

## 2017-02-15 DIAGNOSIS — Z880 Allergy status to penicillin: Secondary | ICD-10-CM | POA: Diagnosis not present

## 2017-02-15 DIAGNOSIS — R278 Other lack of coordination: Secondary | ICD-10-CM | POA: Diagnosis not present

## 2017-02-15 DIAGNOSIS — Z9981 Dependence on supplemental oxygen: Secondary | ICD-10-CM | POA: Diagnosis not present

## 2017-02-17 DIAGNOSIS — I2510 Atherosclerotic heart disease of native coronary artery without angina pectoris: Secondary | ICD-10-CM | POA: Diagnosis not present

## 2017-02-17 DIAGNOSIS — R5381 Other malaise: Secondary | ICD-10-CM | POA: Diagnosis not present

## 2017-02-17 DIAGNOSIS — I5022 Chronic systolic (congestive) heart failure: Secondary | ICD-10-CM | POA: Diagnosis not present

## 2017-02-17 DIAGNOSIS — J9611 Chronic respiratory failure with hypoxia: Secondary | ICD-10-CM | POA: Diagnosis not present

## 2017-02-18 ENCOUNTER — Other Ambulatory Visit: Payer: Self-pay | Admitting: Family Medicine

## 2017-02-18 DIAGNOSIS — R5381 Other malaise: Secondary | ICD-10-CM | POA: Diagnosis not present

## 2017-02-18 DIAGNOSIS — J9611 Chronic respiratory failure with hypoxia: Secondary | ICD-10-CM | POA: Diagnosis not present

## 2017-02-18 DIAGNOSIS — I2510 Atherosclerotic heart disease of native coronary artery without angina pectoris: Secondary | ICD-10-CM | POA: Diagnosis not present

## 2017-02-18 DIAGNOSIS — I5022 Chronic systolic (congestive) heart failure: Secondary | ICD-10-CM | POA: Diagnosis not present

## 2017-02-19 DIAGNOSIS — J9611 Chronic respiratory failure with hypoxia: Secondary | ICD-10-CM | POA: Diagnosis not present

## 2017-02-19 DIAGNOSIS — R7309 Other abnormal glucose: Secondary | ICD-10-CM | POA: Diagnosis not present

## 2017-02-19 DIAGNOSIS — I5189 Other ill-defined heart diseases: Secondary | ICD-10-CM | POA: Diagnosis not present

## 2017-02-24 ENCOUNTER — Ambulatory Visit: Payer: Medicare Other | Admitting: Family Medicine

## 2017-02-25 ENCOUNTER — Ambulatory Visit: Payer: Medicare Other | Admitting: Family Medicine

## 2017-02-25 DIAGNOSIS — I5022 Chronic systolic (congestive) heart failure: Secondary | ICD-10-CM | POA: Diagnosis not present

## 2017-02-25 DIAGNOSIS — E1169 Type 2 diabetes mellitus with other specified complication: Secondary | ICD-10-CM | POA: Diagnosis not present

## 2017-02-26 DIAGNOSIS — G478 Other sleep disorders: Secondary | ICD-10-CM | POA: Diagnosis not present

## 2017-02-26 DIAGNOSIS — G894 Chronic pain syndrome: Secondary | ICD-10-CM | POA: Diagnosis not present

## 2017-02-26 DIAGNOSIS — J9611 Chronic respiratory failure with hypoxia: Secondary | ICD-10-CM | POA: Diagnosis not present

## 2017-02-26 DIAGNOSIS — I5022 Chronic systolic (congestive) heart failure: Secondary | ICD-10-CM | POA: Diagnosis not present

## 2017-03-01 DIAGNOSIS — Z880 Allergy status to penicillin: Secondary | ICD-10-CM | POA: Diagnosis not present

## 2017-03-01 DIAGNOSIS — I509 Heart failure, unspecified: Secondary | ICD-10-CM | POA: Diagnosis not present

## 2017-03-01 DIAGNOSIS — Z885 Allergy status to narcotic agent status: Secondary | ICD-10-CM | POA: Diagnosis not present

## 2017-03-01 DIAGNOSIS — I4891 Unspecified atrial fibrillation: Secondary | ICD-10-CM | POA: Diagnosis not present

## 2017-03-01 DIAGNOSIS — Z87891 Personal history of nicotine dependence: Secondary | ICD-10-CM | POA: Diagnosis not present

## 2017-03-01 DIAGNOSIS — E079 Disorder of thyroid, unspecified: Secondary | ICD-10-CM | POA: Diagnosis not present

## 2017-03-01 DIAGNOSIS — Z7951 Long term (current) use of inhaled steroids: Secondary | ICD-10-CM | POA: Diagnosis not present

## 2017-03-01 DIAGNOSIS — Z9109 Other allergy status, other than to drugs and biological substances: Secondary | ICD-10-CM | POA: Diagnosis not present

## 2017-03-01 DIAGNOSIS — N390 Urinary tract infection, site not specified: Secondary | ICD-10-CM | POA: Diagnosis not present

## 2017-03-01 DIAGNOSIS — B372 Candidiasis of skin and nail: Secondary | ICD-10-CM | POA: Diagnosis not present

## 2017-03-01 DIAGNOSIS — Z7901 Long term (current) use of anticoagulants: Secondary | ICD-10-CM | POA: Diagnosis not present

## 2017-03-01 DIAGNOSIS — Z7902 Long term (current) use of antithrombotics/antiplatelets: Secondary | ICD-10-CM | POA: Diagnosis not present

## 2017-03-01 DIAGNOSIS — Z79899 Other long term (current) drug therapy: Secondary | ICD-10-CM | POA: Diagnosis not present

## 2017-03-01 DIAGNOSIS — I110 Hypertensive heart disease with heart failure: Secondary | ICD-10-CM | POA: Diagnosis not present

## 2017-03-01 DIAGNOSIS — M1990 Unspecified osteoarthritis, unspecified site: Secondary | ICD-10-CM | POA: Diagnosis not present

## 2017-03-01 DIAGNOSIS — E119 Type 2 diabetes mellitus without complications: Secondary | ICD-10-CM | POA: Diagnosis not present

## 2017-03-01 DIAGNOSIS — Z794 Long term (current) use of insulin: Secondary | ICD-10-CM | POA: Diagnosis not present

## 2017-03-01 DIAGNOSIS — M545 Low back pain: Secondary | ICD-10-CM | POA: Diagnosis not present

## 2017-03-02 DIAGNOSIS — I2510 Atherosclerotic heart disease of native coronary artery without angina pectoris: Secondary | ICD-10-CM | POA: Diagnosis not present

## 2017-03-02 DIAGNOSIS — I5033 Acute on chronic diastolic (congestive) heart failure: Secondary | ICD-10-CM | POA: Diagnosis not present

## 2017-03-02 DIAGNOSIS — R5381 Other malaise: Secondary | ICD-10-CM | POA: Diagnosis not present

## 2017-03-02 DIAGNOSIS — I5022 Chronic systolic (congestive) heart failure: Secondary | ICD-10-CM | POA: Diagnosis not present

## 2017-03-02 DIAGNOSIS — M6281 Muscle weakness (generalized): Secondary | ICD-10-CM | POA: Diagnosis not present

## 2017-03-02 DIAGNOSIS — J9611 Chronic respiratory failure with hypoxia: Secondary | ICD-10-CM | POA: Diagnosis not present

## 2017-03-02 DIAGNOSIS — E1169 Type 2 diabetes mellitus with other specified complication: Secondary | ICD-10-CM | POA: Diagnosis not present

## 2017-03-02 DIAGNOSIS — I480 Paroxysmal atrial fibrillation: Secondary | ICD-10-CM | POA: Diagnosis not present

## 2017-03-02 DIAGNOSIS — A498 Other bacterial infections of unspecified site: Secondary | ICD-10-CM | POA: Diagnosis not present

## 2017-03-02 DIAGNOSIS — R2689 Other abnormalities of gait and mobility: Secondary | ICD-10-CM | POA: Diagnosis not present

## 2017-03-02 DIAGNOSIS — R278 Other lack of coordination: Secondary | ICD-10-CM | POA: Diagnosis not present

## 2017-03-02 DIAGNOSIS — N390 Urinary tract infection, site not specified: Secondary | ICD-10-CM | POA: Diagnosis not present

## 2017-03-02 NOTE — Progress Notes (Deleted)
Cardiology Office Note   Date:  03/02/2017   ID:  Kelcey, Korus 1940-11-16, MRN [id number]  PCP:  Ernestina Penna, MD  Cardiologist:   Rollene Rotunda, MD   No chief complaint on file.     History of Present Illness: [name] is a 76 y.o. female who presents for atrial fibrillation and diastolic dysfunction. She is morbidly obese and gets around in a wheelchair predominantly although she can transfer from chair to bed. She has chronic oxygen. She has sleep apnea she doesn't use CPAP.   She is on chronic anticoagulation. Since I last saw her ***    she has had some palpitations.   She does not have any acute shortness of breath though she's chronically dyspneic. She does have any PND or orthopnea. She has no edema.  She says that the doctor at [facility]  Told her to take an extra sotalol if she thought she was having fibrillation.   She thinks she's had to do this a couple of times since I saw her.   Past Medical History:  Diagnosis Date  . Allergic rhinitis   . Back pain   . CAD (coronary artery disease)   . Chronic pain syndrome   . Dementia   . Diabetes mellitus    type 2  . Fibromyalgia   . Hyperlipemia   . Hypothyroidism   . Morbid obesity (HCC)   . 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 Narcolepsy   . Sleep apnea   . SVT (supraventricular tachycardia) (HCC)    afib    Past Surgical History:  Procedure Laterality Date  . APPENDECTOMY    . TOTAL ABDOMINAL HYSTERECTOMY    . TUBAL LIGATION       Current Outpatient Prescriptions  Medication Sig Dispense Refill  . acetaminophen (TYLENOL) 325 MG tablet Take 325 mg by mouth every 6 (six) hours as needed.    Marland Kitchen aspirin EC 81 MG tablet Take 81 mg by mouth.    . calcium-vitamin D (OSCAL WITH D) 500-200 MG-UNIT tablet Take 1 tablet by mouth daily with breakfast.    . cefdinir (OMNICEF) 300 MG capsule Take 1 capsule (300 mg total) by mouth 2 (two) times daily. 1 po BID 20 capsule 0  .  cefdinir (OMNICEF) 300 MG capsule Take 1 capsule (300 mg total) by mouth 2 (two) times daily. 1 po BID 14 capsule 0  . cholecalciferol (VITAMIN D) 1000 units tablet Take 1,000 Units by mouth daily.    . ciprofloxacin (CIPRO) 500 MG tablet Take 1 tablet (500 mg total) by mouth 2 (two) times daily. 20 tablet 0  . clopidogrel (PLAVIX) 75 MG tablet TAKE 1 TABLET BY MOUTH EVERY DAY 30 tablet 2  . donepezil (ARICEPT) 5 MG tablet TAKE 1 TABLET (5 MG TOTAL) BY MOUTH AT BEDTIME. 30 tablet 0  . DULoxetine (CYMBALTA) 30 MG capsule Take 3 capsules (90 mg total) by mouth daily. 270 capsule 0  . ferrous gluconate (FERGON) 324 MG tablet Take 324 mg by mouth 2 (two) times daily with a meal.    . furosemide (LASIX) 20 MG tablet TAKE 1 TABLET BY MOUTH EVERY DAY 90 tablet 1  . glucose blood (ONE TOUCH ULTRA TEST) test strip Test 4X a day and prn  Dx 250.02 100 each 12  . insulin detemir (LEVEMIR) 100 UNIT/ML injection Inject 22 Units into the skin 2 (two) times daily.     . Insulin  Syringe-Needle U-100 (ADVOCATE INSULIN SYRINGE) 31G X 5/16" 0.3 ML MISC 1 each by Does not apply route 5 (five) times daily. 150 each 11  . isosorbide mononitrate (IMDUR) 30 MG 24 hr tablet TAKE 1 TABLET BY MOUTH EVERY OTHER DAY 45 tablet 1  . LEVEMIR FLEXTOUCH 100 UNIT/ML Pen INJECT 20 UNITS SUBCUTANEOUSLY TWICE A DAY 15 pen 1  . levothyroxine (SYNTHROID, LEVOTHROID) 75 MCG tablet TAKE 1 TABLET BY MOUTH EVERY DAY 90 tablet 1  . metoprolol tartrate (LOPRESSOR) 50 MG tablet Take 1 tablet (50 mg total) by mouth 2 (two) times daily. 180 tablet 1  . nitroGLYCERIN (NITROSTAT) 0.4 MG SL tablet USE AS DIRECTED 25 tablet 4  . nystatin (NYSTATIN) powder USE AS DIRECTED 60 g 6  . oxyCODONE (OXYCONTIN) 10 mg 12 hr tablet Take 10 mg by mouth every 12 (twelve) hours.    . polyethylene glycol (MIRALAX / GLYCOLAX) packet Take 17 g by mouth daily as needed.     . potassium chloride (K-DUR,KLOR-CON) 10 MEQ tablet Take 10 mEq by mouth daily.    .  potassium chloride (MICRO-K) 10 MEQ CR capsule TAKE ONE CAPSULE BY MOUTH EVERY DAY 90 capsule 1  . pravastatin (PRAVACHOL) 80 MG tablet TAKE 1 TABLET BY MOUTH EVERY DAY 90 tablet 1  . senna (SENOKOT) 8.6 MG TABS tablet Take 1 tablet by mouth 2 (two) times daily.    . traZODone (DESYREL) 50 MG tablet Take 0.5-1 tablets (25-50 mg total) by mouth at bedtime as needed for sleep. 30 tablet 3  . XARELTO 20 MG TABS tablet TAKE 1 TABLET BY MOUTH EVERY DAY 30 tablet 2   No current facility-administered medications for this visit.     Allergies:   Amitriptyline; Codeine; Crestor [rosuvastatin]; Detrol [tolterodine]; Lipitor [atorvastatin]; Lisinopril; Penicillins; Seroquel [quetiapine fumarate]; and Adhesive  [tape]     ROS:  Please see the history of present illness.   Otherwise, review of systems are positive for ***.   All other systems are reviewed and negative.    PHYSICAL EXAM: VS:  There were no vitals taken for this visit. , BMI There is no height or weight on file to calculate BMI.  PHYSICAL EXAM GEN:  No distress NECK:  No jugular venous distention at 90 degrees, waveform within normal limits, carotid upstroke brisk and symmetric, no bruits, no thyromegaly LYMPHATICS:  No cervical adenopathy LUNGS:  Clear to auscultation bilaterally BACK:  No CVA tenderness CHEST:  Unremarkable HEART:  S1 and S2 within normal limits, no S3, no S4, no clicks, no rubs, *** murmurs ABD:  Positive bowel sounds normal in frequency in pitch, no bruits, no rebound, no guarding, unable to assess midline mass or bruit with the patient seated. EXT:  2 plus pulses throughout, moderate edema, no cyanosis no clubbing SKIN:  No rashes no nodules   PHYSICAL EXAM GEN:  No distress NECK:  No jugular venous distention at 90 degrees, waveform within normal limits, carotid upstroke brisk and symmetric, no bruits, no thyromegaly LYMPHATICS:  No cervical adenopathy LUNGS:  Clear to auscultation bilaterally BACK:  No  CVA tenderness CHEST:  Unremarkable HEART:  S1 and S2 within normal limits, no S3, no S4, no clicks, no rubs, no murmurs ABD:  Positive bowel sounds normal in frequency in pitch, no bruits, no rebound, no guarding, unable to assess midline mass or bruit with the patient seated. EXT:  2 plus pulses throughout, no edema, no cyanosis no clubbing SKIN:  No rashes no nodules NEURO:  Cranial nerves II through XII grossly intact, motor grossly intact throughout PSYCH:  Cognitively intact, oriented to person place and time   EKG:  EKG is *** ordered today. The ekg ordered today demonstrates Sinus rhythm, rate ***, axis within normal limits, intervals within normal limits, no acute ST-T wave changes.   Recent Labs: 10/26/2016: ALT 15; BUN 22; Creatinine, Ser 0.77; Hemoglobin 12.0; Platelets 197; Potassium 4.6; Sodium 139; TSH 1.810     Wt Readings from Last 3 Encounters:  12/21/16 282 lb (127.9 kg)  11/23/16 282 lb (127.9 kg)  10/31/15 275 lb (124.7 kg)      Other studies Reviewed: Additional studies/ records that were reviewed today include:   *** Review of the above records demonstrates:  ***   ASSESSMENT AND PLAN:  ATRIAL FIB:   ***  She might have had some paroxysms. However, these are brief. I do not want her taking extra sotalol but will give her prescription for when necessary beta blocker should she have any palpitations. She will otherwise remain on the meds as listed including anticoagulation.  DIASTOLIC DYSFUNCTION:   ***  She eats a lot of canned food.   We discussed the high salt content. She's not had any overt volume overload however. She will remain on the meds as listed.   Current medicines are reviewed at length with the patient today.  The patient does not have concerns regarding medicines.  The following changes have been made:  ***  Labs/ tests ordered today include: *** No orders of the defined types were placed in this encounter.    Disposition:   FU with  ***months with me.     Signed, Rollene Rotunda, MD  03/02/2017 5:37 PM     [facility]

## 2017-03-03 ENCOUNTER — Ambulatory Visit: Payer: Medicare Other | Admitting: Cardiology

## 2017-03-03 DIAGNOSIS — N390 Urinary tract infection, site not specified: Secondary | ICD-10-CM | POA: Diagnosis not present

## 2017-03-04 DIAGNOSIS — E1169 Type 2 diabetes mellitus with other specified complication: Secondary | ICD-10-CM | POA: Diagnosis not present

## 2017-03-04 DIAGNOSIS — I2510 Atherosclerotic heart disease of native coronary artery without angina pectoris: Secondary | ICD-10-CM | POA: Diagnosis not present

## 2017-03-04 DIAGNOSIS — J9611 Chronic respiratory failure with hypoxia: Secondary | ICD-10-CM | POA: Diagnosis not present

## 2017-03-04 DIAGNOSIS — R5381 Other malaise: Secondary | ICD-10-CM | POA: Diagnosis not present

## 2017-03-06 DIAGNOSIS — N390 Urinary tract infection, site not specified: Secondary | ICD-10-CM | POA: Diagnosis not present

## 2017-03-06 DIAGNOSIS — R0600 Dyspnea, unspecified: Secondary | ICD-10-CM | POA: Diagnosis not present

## 2017-03-06 DIAGNOSIS — I5033 Acute on chronic diastolic (congestive) heart failure: Secondary | ICD-10-CM | POA: Diagnosis not present

## 2017-03-06 DIAGNOSIS — J9611 Chronic respiratory failure with hypoxia: Secondary | ICD-10-CM | POA: Diagnosis not present

## 2017-03-06 DIAGNOSIS — I2510 Atherosclerotic heart disease of native coronary artery without angina pectoris: Secondary | ICD-10-CM | POA: Diagnosis not present

## 2017-03-06 DIAGNOSIS — I4891 Unspecified atrial fibrillation: Secondary | ICD-10-CM | POA: Diagnosis not present

## 2017-03-06 DIAGNOSIS — E1169 Type 2 diabetes mellitus with other specified complication: Secondary | ICD-10-CM | POA: Diagnosis not present

## 2017-03-06 DIAGNOSIS — J9612 Chronic respiratory failure with hypercapnia: Secondary | ICD-10-CM | POA: Diagnosis not present

## 2017-03-06 DIAGNOSIS — G2581 Restless legs syndrome: Secondary | ICD-10-CM | POA: Diagnosis not present

## 2017-03-06 DIAGNOSIS — G4733 Obstructive sleep apnea (adult) (pediatric): Secondary | ICD-10-CM | POA: Diagnosis not present

## 2017-03-06 DIAGNOSIS — I110 Hypertensive heart disease with heart failure: Secondary | ICD-10-CM | POA: Diagnosis not present

## 2017-03-06 DIAGNOSIS — G894 Chronic pain syndrome: Secondary | ICD-10-CM | POA: Diagnosis not present

## 2017-03-06 DIAGNOSIS — I480 Paroxysmal atrial fibrillation: Secondary | ICD-10-CM | POA: Diagnosis not present

## 2017-03-07 DIAGNOSIS — N390 Urinary tract infection, site not specified: Secondary | ICD-10-CM | POA: Diagnosis not present

## 2017-03-07 DIAGNOSIS — R0602 Shortness of breath: Secondary | ICD-10-CM | POA: Diagnosis not present

## 2017-03-07 DIAGNOSIS — E785 Hyperlipidemia, unspecified: Secondary | ICD-10-CM | POA: Diagnosis not present

## 2017-03-07 DIAGNOSIS — I471 Supraventricular tachycardia: Secondary | ICD-10-CM | POA: Diagnosis not present

## 2017-03-07 DIAGNOSIS — Z885 Allergy status to narcotic agent status: Secondary | ICD-10-CM | POA: Diagnosis not present

## 2017-03-07 DIAGNOSIS — R0689 Other abnormalities of breathing: Secondary | ICD-10-CM | POA: Diagnosis not present

## 2017-03-07 DIAGNOSIS — J9611 Chronic respiratory failure with hypoxia: Secondary | ICD-10-CM | POA: Diagnosis not present

## 2017-03-07 DIAGNOSIS — E119 Type 2 diabetes mellitus without complications: Secondary | ICD-10-CM | POA: Diagnosis not present

## 2017-03-07 DIAGNOSIS — I499 Cardiac arrhythmia, unspecified: Secondary | ICD-10-CM | POA: Diagnosis not present

## 2017-03-07 DIAGNOSIS — I491 Atrial premature depolarization: Secondary | ICD-10-CM | POA: Diagnosis not present

## 2017-03-07 DIAGNOSIS — M25559 Pain in unspecified hip: Secondary | ICD-10-CM | POA: Diagnosis not present

## 2017-03-07 DIAGNOSIS — Z880 Allergy status to penicillin: Secondary | ICD-10-CM | POA: Diagnosis not present

## 2017-03-07 DIAGNOSIS — I509 Heart failure, unspecified: Secondary | ICD-10-CM | POA: Diagnosis not present

## 2017-03-07 DIAGNOSIS — J90 Pleural effusion, not elsewhere classified: Secondary | ICD-10-CM | POA: Diagnosis not present

## 2017-03-07 DIAGNOSIS — I480 Paroxysmal atrial fibrillation: Secondary | ICD-10-CM | POA: Diagnosis not present

## 2017-03-07 DIAGNOSIS — Z7902 Long term (current) use of antithrombotics/antiplatelets: Secondary | ICD-10-CM | POA: Diagnosis not present

## 2017-03-07 DIAGNOSIS — G2581 Restless legs syndrome: Secondary | ICD-10-CM | POA: Diagnosis not present

## 2017-03-07 DIAGNOSIS — G8929 Other chronic pain: Secondary | ICD-10-CM | POA: Diagnosis not present

## 2017-03-07 DIAGNOSIS — E039 Hypothyroidism, unspecified: Secondary | ICD-10-CM | POA: Diagnosis not present

## 2017-03-07 DIAGNOSIS — R627 Adult failure to thrive: Secondary | ICD-10-CM | POA: Diagnosis not present

## 2017-03-07 DIAGNOSIS — I110 Hypertensive heart disease with heart failure: Secondary | ICD-10-CM | POA: Diagnosis not present

## 2017-03-07 DIAGNOSIS — I2510 Atherosclerotic heart disease of native coronary artery without angina pectoris: Secondary | ICD-10-CM | POA: Diagnosis not present

## 2017-03-07 DIAGNOSIS — I5033 Acute on chronic diastolic (congestive) heart failure: Secondary | ICD-10-CM | POA: Diagnosis not present

## 2017-03-07 DIAGNOSIS — Z7901 Long term (current) use of anticoagulants: Secondary | ICD-10-CM | POA: Diagnosis not present

## 2017-03-07 DIAGNOSIS — J810 Acute pulmonary edema: Secondary | ICD-10-CM | POA: Diagnosis not present

## 2017-03-12 ENCOUNTER — Telehealth: Payer: Self-pay | Admitting: Family Medicine

## 2017-03-12 DIAGNOSIS — I2510 Atherosclerotic heart disease of native coronary artery without angina pectoris: Secondary | ICD-10-CM | POA: Diagnosis not present

## 2017-03-12 DIAGNOSIS — G2581 Restless legs syndrome: Secondary | ICD-10-CM | POA: Diagnosis not present

## 2017-03-12 DIAGNOSIS — N390 Urinary tract infection, site not specified: Secondary | ICD-10-CM | POA: Diagnosis not present

## 2017-03-12 DIAGNOSIS — I480 Paroxysmal atrial fibrillation: Secondary | ICD-10-CM | POA: Diagnosis not present

## 2017-03-12 DIAGNOSIS — J9611 Chronic respiratory failure with hypoxia: Secondary | ICD-10-CM | POA: Diagnosis not present

## 2017-03-12 DIAGNOSIS — G894 Chronic pain syndrome: Secondary | ICD-10-CM | POA: Diagnosis not present

## 2017-03-12 DIAGNOSIS — E1169 Type 2 diabetes mellitus with other specified complication: Secondary | ICD-10-CM | POA: Diagnosis not present

## 2017-03-12 DIAGNOSIS — G4733 Obstructive sleep apnea (adult) (pediatric): Secondary | ICD-10-CM | POA: Diagnosis not present

## 2017-03-12 DIAGNOSIS — I5033 Acute on chronic diastolic (congestive) heart failure: Secondary | ICD-10-CM | POA: Diagnosis not present

## 2017-03-12 DIAGNOSIS — I110 Hypertensive heart disease with heart failure: Secondary | ICD-10-CM | POA: Diagnosis not present

## 2017-03-12 NOTE — Telephone Encounter (Signed)
Send her in NovoLog 2-10 units sliding scale, 2 units if less than 150, 4 units if greater than 150 but less than 200, 6 units if between 200-250.

## 2017-03-12 NOTE — Telephone Encounter (Signed)
I do not this that the pt can handle the sliding scale - she is already confused on the meds     Looks like she got it in the hosp like this   Inject 20 units into the skin every morning, 18 units at lunch, and 12 units at bedtime -   Would this be appropriate - or would you rather take a chance with SS?

## 2017-03-12 NOTE — Telephone Encounter (Signed)
DR D  I do not see on med list =she is confused  Please address

## 2017-03-13 NOTE — Telephone Encounter (Signed)
Yes that can work, I thought her family member was doing a lot of her medications

## 2017-03-14 DIAGNOSIS — J9611 Chronic respiratory failure with hypoxia: Secondary | ICD-10-CM | POA: Diagnosis not present

## 2017-03-14 DIAGNOSIS — E1169 Type 2 diabetes mellitus with other specified complication: Secondary | ICD-10-CM | POA: Diagnosis not present

## 2017-03-14 DIAGNOSIS — G894 Chronic pain syndrome: Secondary | ICD-10-CM | POA: Diagnosis not present

## 2017-03-14 DIAGNOSIS — I110 Hypertensive heart disease with heart failure: Secondary | ICD-10-CM | POA: Diagnosis not present

## 2017-03-14 DIAGNOSIS — I480 Paroxysmal atrial fibrillation: Secondary | ICD-10-CM | POA: Diagnosis not present

## 2017-03-14 DIAGNOSIS — I2510 Atherosclerotic heart disease of native coronary artery without angina pectoris: Secondary | ICD-10-CM | POA: Diagnosis not present

## 2017-03-14 DIAGNOSIS — N390 Urinary tract infection, site not specified: Secondary | ICD-10-CM | POA: Diagnosis not present

## 2017-03-14 DIAGNOSIS — G4733 Obstructive sleep apnea (adult) (pediatric): Secondary | ICD-10-CM | POA: Diagnosis not present

## 2017-03-14 DIAGNOSIS — G2581 Restless legs syndrome: Secondary | ICD-10-CM | POA: Diagnosis not present

## 2017-03-14 DIAGNOSIS — I5033 Acute on chronic diastolic (congestive) heart failure: Secondary | ICD-10-CM | POA: Diagnosis not present

## 2017-03-16 DIAGNOSIS — E1169 Type 2 diabetes mellitus with other specified complication: Secondary | ICD-10-CM | POA: Diagnosis not present

## 2017-03-16 DIAGNOSIS — I480 Paroxysmal atrial fibrillation: Secondary | ICD-10-CM | POA: Diagnosis not present

## 2017-03-16 DIAGNOSIS — N390 Urinary tract infection, site not specified: Secondary | ICD-10-CM | POA: Diagnosis not present

## 2017-03-16 DIAGNOSIS — G2581 Restless legs syndrome: Secondary | ICD-10-CM | POA: Diagnosis not present

## 2017-03-16 DIAGNOSIS — I5033 Acute on chronic diastolic (congestive) heart failure: Secondary | ICD-10-CM | POA: Diagnosis not present

## 2017-03-16 DIAGNOSIS — I110 Hypertensive heart disease with heart failure: Secondary | ICD-10-CM | POA: Diagnosis not present

## 2017-03-16 DIAGNOSIS — J9611 Chronic respiratory failure with hypoxia: Secondary | ICD-10-CM | POA: Diagnosis not present

## 2017-03-16 DIAGNOSIS — G4733 Obstructive sleep apnea (adult) (pediatric): Secondary | ICD-10-CM | POA: Diagnosis not present

## 2017-03-16 DIAGNOSIS — I2510 Atherosclerotic heart disease of native coronary artery without angina pectoris: Secondary | ICD-10-CM | POA: Diagnosis not present

## 2017-03-16 DIAGNOSIS — G894 Chronic pain syndrome: Secondary | ICD-10-CM | POA: Diagnosis not present

## 2017-03-17 DIAGNOSIS — G894 Chronic pain syndrome: Secondary | ICD-10-CM | POA: Diagnosis not present

## 2017-03-17 DIAGNOSIS — I2510 Atherosclerotic heart disease of native coronary artery without angina pectoris: Secondary | ICD-10-CM | POA: Diagnosis not present

## 2017-03-17 DIAGNOSIS — G4733 Obstructive sleep apnea (adult) (pediatric): Secondary | ICD-10-CM | POA: Diagnosis not present

## 2017-03-17 DIAGNOSIS — G2581 Restless legs syndrome: Secondary | ICD-10-CM | POA: Diagnosis not present

## 2017-03-17 DIAGNOSIS — I5033 Acute on chronic diastolic (congestive) heart failure: Secondary | ICD-10-CM | POA: Diagnosis not present

## 2017-03-17 DIAGNOSIS — J9611 Chronic respiratory failure with hypoxia: Secondary | ICD-10-CM | POA: Diagnosis not present

## 2017-03-17 DIAGNOSIS — E1169 Type 2 diabetes mellitus with other specified complication: Secondary | ICD-10-CM | POA: Diagnosis not present

## 2017-03-17 DIAGNOSIS — N390 Urinary tract infection, site not specified: Secondary | ICD-10-CM | POA: Diagnosis not present

## 2017-03-17 DIAGNOSIS — I110 Hypertensive heart disease with heart failure: Secondary | ICD-10-CM | POA: Diagnosis not present

## 2017-03-17 DIAGNOSIS — I480 Paroxysmal atrial fibrillation: Secondary | ICD-10-CM | POA: Diagnosis not present

## 2017-03-18 ENCOUNTER — Ambulatory Visit: Payer: Medicare Other | Admitting: Family Medicine

## 2017-03-18 DIAGNOSIS — I5033 Acute on chronic diastolic (congestive) heart failure: Secondary | ICD-10-CM | POA: Diagnosis not present

## 2017-03-18 DIAGNOSIS — E1169 Type 2 diabetes mellitus with other specified complication: Secondary | ICD-10-CM | POA: Diagnosis not present

## 2017-03-18 DIAGNOSIS — I110 Hypertensive heart disease with heart failure: Secondary | ICD-10-CM | POA: Diagnosis not present

## 2017-03-18 DIAGNOSIS — G894 Chronic pain syndrome: Secondary | ICD-10-CM | POA: Diagnosis not present

## 2017-03-18 DIAGNOSIS — J9611 Chronic respiratory failure with hypoxia: Secondary | ICD-10-CM | POA: Diagnosis not present

## 2017-03-18 DIAGNOSIS — G2581 Restless legs syndrome: Secondary | ICD-10-CM | POA: Diagnosis not present

## 2017-03-18 DIAGNOSIS — I2510 Atherosclerotic heart disease of native coronary artery without angina pectoris: Secondary | ICD-10-CM | POA: Diagnosis not present

## 2017-03-18 DIAGNOSIS — I480 Paroxysmal atrial fibrillation: Secondary | ICD-10-CM | POA: Diagnosis not present

## 2017-03-18 DIAGNOSIS — G4733 Obstructive sleep apnea (adult) (pediatric): Secondary | ICD-10-CM | POA: Diagnosis not present

## 2017-03-18 DIAGNOSIS — N390 Urinary tract infection, site not specified: Secondary | ICD-10-CM | POA: Diagnosis not present

## 2017-03-22 DIAGNOSIS — J9611 Chronic respiratory failure with hypoxia: Secondary | ICD-10-CM | POA: Diagnosis not present

## 2017-03-22 DIAGNOSIS — I110 Hypertensive heart disease with heart failure: Secondary | ICD-10-CM | POA: Diagnosis not present

## 2017-03-22 DIAGNOSIS — I5033 Acute on chronic diastolic (congestive) heart failure: Secondary | ICD-10-CM | POA: Diagnosis not present

## 2017-03-22 DIAGNOSIS — N390 Urinary tract infection, site not specified: Secondary | ICD-10-CM | POA: Diagnosis not present

## 2017-03-22 DIAGNOSIS — G894 Chronic pain syndrome: Secondary | ICD-10-CM | POA: Diagnosis not present

## 2017-03-22 DIAGNOSIS — G4733 Obstructive sleep apnea (adult) (pediatric): Secondary | ICD-10-CM | POA: Diagnosis not present

## 2017-03-22 DIAGNOSIS — E1169 Type 2 diabetes mellitus with other specified complication: Secondary | ICD-10-CM | POA: Diagnosis not present

## 2017-03-22 DIAGNOSIS — I480 Paroxysmal atrial fibrillation: Secondary | ICD-10-CM | POA: Diagnosis not present

## 2017-03-22 DIAGNOSIS — G2581 Restless legs syndrome: Secondary | ICD-10-CM | POA: Diagnosis not present

## 2017-03-22 DIAGNOSIS — I2510 Atherosclerotic heart disease of native coronary artery without angina pectoris: Secondary | ICD-10-CM | POA: Diagnosis not present

## 2017-03-23 DIAGNOSIS — G4733 Obstructive sleep apnea (adult) (pediatric): Secondary | ICD-10-CM | POA: Diagnosis not present

## 2017-03-23 DIAGNOSIS — G2581 Restless legs syndrome: Secondary | ICD-10-CM | POA: Diagnosis not present

## 2017-03-23 DIAGNOSIS — I110 Hypertensive heart disease with heart failure: Secondary | ICD-10-CM | POA: Diagnosis not present

## 2017-03-23 DIAGNOSIS — I5033 Acute on chronic diastolic (congestive) heart failure: Secondary | ICD-10-CM | POA: Diagnosis not present

## 2017-03-23 DIAGNOSIS — I480 Paroxysmal atrial fibrillation: Secondary | ICD-10-CM | POA: Diagnosis not present

## 2017-03-23 DIAGNOSIS — E1169 Type 2 diabetes mellitus with other specified complication: Secondary | ICD-10-CM | POA: Diagnosis not present

## 2017-03-23 DIAGNOSIS — N390 Urinary tract infection, site not specified: Secondary | ICD-10-CM | POA: Diagnosis not present

## 2017-03-23 DIAGNOSIS — I2510 Atherosclerotic heart disease of native coronary artery without angina pectoris: Secondary | ICD-10-CM | POA: Diagnosis not present

## 2017-03-23 DIAGNOSIS — J9611 Chronic respiratory failure with hypoxia: Secondary | ICD-10-CM | POA: Diagnosis not present

## 2017-03-23 DIAGNOSIS — G894 Chronic pain syndrome: Secondary | ICD-10-CM | POA: Diagnosis not present

## 2017-03-24 ENCOUNTER — Encounter: Payer: Self-pay | Admitting: Family Medicine

## 2017-03-24 ENCOUNTER — Ambulatory Visit (INDEPENDENT_AMBULATORY_CARE_PROVIDER_SITE_OTHER): Payer: Medicare Other | Admitting: Family Medicine

## 2017-03-24 VITALS — BP 102/57 | HR 54 | Temp 97.5°F

## 2017-03-24 DIAGNOSIS — G4733 Obstructive sleep apnea (adult) (pediatric): Secondary | ICD-10-CM | POA: Diagnosis not present

## 2017-03-24 DIAGNOSIS — J9611 Chronic respiratory failure with hypoxia: Secondary | ICD-10-CM | POA: Diagnosis not present

## 2017-03-24 DIAGNOSIS — N390 Urinary tract infection, site not specified: Secondary | ICD-10-CM | POA: Diagnosis not present

## 2017-03-24 DIAGNOSIS — I480 Paroxysmal atrial fibrillation: Secondary | ICD-10-CM

## 2017-03-24 DIAGNOSIS — I5043 Acute on chronic combined systolic (congestive) and diastolic (congestive) heart failure: Secondary | ICD-10-CM | POA: Diagnosis not present

## 2017-03-24 DIAGNOSIS — E785 Hyperlipidemia, unspecified: Secondary | ICD-10-CM | POA: Diagnosis not present

## 2017-03-24 DIAGNOSIS — G2581 Restless legs syndrome: Secondary | ICD-10-CM | POA: Diagnosis not present

## 2017-03-24 DIAGNOSIS — I5033 Acute on chronic diastolic (congestive) heart failure: Secondary | ICD-10-CM | POA: Diagnosis not present

## 2017-03-24 DIAGNOSIS — E1169 Type 2 diabetes mellitus with other specified complication: Secondary | ICD-10-CM

## 2017-03-24 DIAGNOSIS — I2510 Atherosclerotic heart disease of native coronary artery without angina pectoris: Secondary | ICD-10-CM | POA: Diagnosis not present

## 2017-03-24 DIAGNOSIS — G894 Chronic pain syndrome: Secondary | ICD-10-CM | POA: Diagnosis not present

## 2017-03-24 DIAGNOSIS — Z993 Dependence on wheelchair: Secondary | ICD-10-CM

## 2017-03-24 DIAGNOSIS — I110 Hypertensive heart disease with heart failure: Secondary | ICD-10-CM | POA: Diagnosis not present

## 2017-03-24 NOTE — Progress Notes (Signed)
BP (!) 102/57   Pulse (!) 54   Temp (!) 97.5 F (36.4 C) (Oral)   SpO2 92%    Subjective:    Patient ID: [name], female    DOB: 1941/07/10, 76 y.o.   MRN: [id number]  HPI: [name] is a 76 y.o. female presenting on 03/24/2017 for Hospitalization Follow-up ([facility] 8/26 SOB- patient states she is feeling a lot better and is onlt SOB at [facility])   HPI Acute on chronic congestive heart failure and shortness of breath Patient is coming in for hospital follow-up for acute CHF and shortness of breath that she was seen on 03/07/2017 at [facility] and discharged on 03/11/2017. Since leaving the hospital her son and her says she's been doing a lot better with the breathing on the swelling. During the hospitalization she was also found to be in A. fib with RVR and since leaving the hospital he says that she has not had any chest pain or palpitations. Her heart rate today is 54 and her blood pressure today is 102/57. She still has swelling but is back to where she normally is and is not access. Patient has been having increased generalized weakness and is a lot more wheelchair-bound and is leaving the hospital. The current wheelchair they have is run down and they're afraid that it will break under her weight  Type 2 diabetes mellitus Patient comes in today for recheck of his diabetes. Patient has been currently taking Levemir and NovoLog sliding scale, he says he is currently given anywhere between 13 and 20 of NovoLog with meals, so the medications had been stopped because of kidney issues and CHF. Her son has been trying to manage and monitor and says that she has been getting some hypoglycemic episodes and then some blood sugars that are going way too high. Her hypoglycemic episodes have been down to 74 and happened on a couple of occasions. He does admit that she still eats a lot of carbohydrates and potatoes and is not giving up soda. Patient is not currently on an ACE  inhibitor/ARB. Patient has not seen an ophthalmologist this year. Patient denies any new issues with their feet.   Relevant past medical, surgical, family and social history reviewed and updated as indicated. Interim medical history since our last visit reviewed. Allergies and medications reviewed and updated.  Review of Systems  Constitutional: Negative for chills and fever.  HENT: Negative for congestion, ear discharge and ear pain.   Eyes: Negative for redness and visual disturbance.  Respiratory: Negative for cough, chest tightness, shortness of breath and wheezing.   Cardiovascular: Positive for leg swelling. Negative for chest pain and palpitations.  Genitourinary: Negative for difficulty urinating.  Musculoskeletal: Negative for back pain and gait problem.  Skin: Negative for rash.  Neurological: Negative for dizziness, light-headedness and headaches.  Psychiatric/Behavioral: Negative for agitation and behavioral problems.  All other systems reviewed and are negative.   Per HPI unless specifically indicated above        Objective:    BP (!) 102/57   Pulse (!) 54   Temp (!) 97.5 F (36.4 C) (Oral)   SpO2 92%   Wt Readings from Last 3 Encounters:  12/21/16 282 lb (127.9 kg)  11/23/16 282 lb (127.9 kg)  10/31/15 275 lb (124.7 kg)    Physical Exam  Constitutional: She is oriented to person, place, and time. She appears well-developed and well-nourished. No distress.  Eyes: Conjunctivae are normal.  Neck:  Neck supple. No thyromegaly present.  Cardiovascular: Normal rate, normal heart sounds and intact distal pulses.  An irregular rhythm present.  No murmur heard. Pulmonary/Chest: Effort normal and breath sounds normal. No respiratory distress. She has no wheezes.  Musculoskeletal: Normal range of motion. She exhibits edema (2+ bilateral edema).  Lymphadenopathy:    She has no cervical adenopathy.  Neurological: She is alert and oriented to person, place, and time.  Coordination normal.  Skin: Skin is warm and dry. No rash noted. She is not diaphoretic.  Psychiatric: She has a normal mood and affect. Her behavior is normal.  Nursing note and vitals reviewed.     Assessment & Plan:   Problem List Items Addressed This Visit      Cardiovascular and Mediastinum   Congestive heart failure (HCC)   Atrial fibrillation (HCC) - Primary     Endocrine   Type 2 diabetes mellitus with hyperlipidemia (HCC)    Other Visit Diagnoses    Wheelchair bound       Relevant Orders   DME Wheelchair electric   DME Wheelchair manual      Check blood sugars only twice a day and save the third if you need it.  Check before you eat anything in the morning, check 1 hour after your biggest meal which per her is lunch.  Give NovoLog 3 times daily with meal intake 14 before breakfast and 12 before lunch and 10 before dinner  Continue Levemir 22 twice a day  Do daily weights  Patient has follow-up with cardiology  Follow up plan: Return in about 2 months (around 05/24/2017), or if symptoms worsen or fail to improve, for Recheck diabetes and CHF.  Counseling provided for all of the vaccine components No orders of the defined types were placed in this encounter.   Arville CareJoshua Shamecca Whitebread, MD [facility] 03/24/2017, 4:13 PM

## 2017-03-24 NOTE — Patient Instructions (Signed)
Check blood sugars only twice a day and save the third if you need it.  Check before you eat anything in the morning, check 1 hour after your biggest meal which per her is lunch.  Give NovoLog 3 times daily with meal intake 14 before breakfast and 12 before lunch and 10 before dinner  Continue Levemir 22 twice a day  Do daily weights

## 2017-03-25 DIAGNOSIS — I110 Hypertensive heart disease with heart failure: Secondary | ICD-10-CM | POA: Diagnosis not present

## 2017-03-25 DIAGNOSIS — G894 Chronic pain syndrome: Secondary | ICD-10-CM | POA: Diagnosis not present

## 2017-03-25 DIAGNOSIS — I2510 Atherosclerotic heart disease of native coronary artery without angina pectoris: Secondary | ICD-10-CM | POA: Diagnosis not present

## 2017-03-25 DIAGNOSIS — G4733 Obstructive sleep apnea (adult) (pediatric): Secondary | ICD-10-CM | POA: Diagnosis not present

## 2017-03-25 DIAGNOSIS — I480 Paroxysmal atrial fibrillation: Secondary | ICD-10-CM | POA: Diagnosis not present

## 2017-03-25 DIAGNOSIS — N390 Urinary tract infection, site not specified: Secondary | ICD-10-CM | POA: Diagnosis not present

## 2017-03-25 DIAGNOSIS — J9611 Chronic respiratory failure with hypoxia: Secondary | ICD-10-CM | POA: Diagnosis not present

## 2017-03-25 DIAGNOSIS — E1169 Type 2 diabetes mellitus with other specified complication: Secondary | ICD-10-CM | POA: Diagnosis not present

## 2017-03-25 DIAGNOSIS — I5033 Acute on chronic diastolic (congestive) heart failure: Secondary | ICD-10-CM | POA: Diagnosis not present

## 2017-03-25 DIAGNOSIS — G2581 Restless legs syndrome: Secondary | ICD-10-CM | POA: Diagnosis not present

## 2017-03-29 DIAGNOSIS — I480 Paroxysmal atrial fibrillation: Secondary | ICD-10-CM | POA: Diagnosis not present

## 2017-03-29 DIAGNOSIS — N390 Urinary tract infection, site not specified: Secondary | ICD-10-CM | POA: Diagnosis not present

## 2017-03-29 DIAGNOSIS — G2581 Restless legs syndrome: Secondary | ICD-10-CM | POA: Diagnosis not present

## 2017-03-29 DIAGNOSIS — I110 Hypertensive heart disease with heart failure: Secondary | ICD-10-CM | POA: Diagnosis not present

## 2017-03-29 DIAGNOSIS — I5033 Acute on chronic diastolic (congestive) heart failure: Secondary | ICD-10-CM | POA: Diagnosis not present

## 2017-03-29 DIAGNOSIS — G894 Chronic pain syndrome: Secondary | ICD-10-CM | POA: Diagnosis not present

## 2017-03-29 DIAGNOSIS — J9611 Chronic respiratory failure with hypoxia: Secondary | ICD-10-CM | POA: Diagnosis not present

## 2017-03-29 DIAGNOSIS — G4733 Obstructive sleep apnea (adult) (pediatric): Secondary | ICD-10-CM | POA: Diagnosis not present

## 2017-03-29 DIAGNOSIS — E1169 Type 2 diabetes mellitus with other specified complication: Secondary | ICD-10-CM | POA: Diagnosis not present

## 2017-03-29 DIAGNOSIS — I2510 Atherosclerotic heart disease of native coronary artery without angina pectoris: Secondary | ICD-10-CM | POA: Diagnosis not present

## 2017-03-30 ENCOUNTER — Telehealth: Payer: Self-pay | Admitting: Family Medicine

## 2017-03-30 DIAGNOSIS — G4733 Obstructive sleep apnea (adult) (pediatric): Secondary | ICD-10-CM | POA: Diagnosis not present

## 2017-03-30 DIAGNOSIS — N390 Urinary tract infection, site not specified: Secondary | ICD-10-CM | POA: Diagnosis not present

## 2017-03-30 DIAGNOSIS — I110 Hypertensive heart disease with heart failure: Secondary | ICD-10-CM | POA: Diagnosis not present

## 2017-03-30 DIAGNOSIS — E1169 Type 2 diabetes mellitus with other specified complication: Secondary | ICD-10-CM | POA: Diagnosis not present

## 2017-03-30 DIAGNOSIS — G894 Chronic pain syndrome: Secondary | ICD-10-CM | POA: Diagnosis not present

## 2017-03-30 DIAGNOSIS — I5033 Acute on chronic diastolic (congestive) heart failure: Secondary | ICD-10-CM | POA: Diagnosis not present

## 2017-03-30 DIAGNOSIS — G2581 Restless legs syndrome: Secondary | ICD-10-CM | POA: Diagnosis not present

## 2017-03-30 DIAGNOSIS — I2510 Atherosclerotic heart disease of native coronary artery without angina pectoris: Secondary | ICD-10-CM | POA: Diagnosis not present

## 2017-03-30 DIAGNOSIS — J9611 Chronic respiratory failure with hypoxia: Secondary | ICD-10-CM | POA: Diagnosis not present

## 2017-03-30 DIAGNOSIS — I480 Paroxysmal atrial fibrillation: Secondary | ICD-10-CM | POA: Diagnosis not present

## 2017-03-30 NOTE — Telephone Encounter (Signed)
Patient's son Casimiro Needle called stating that patient is now sleeping a lot more. Falling asleep while eating.

## 2017-03-31 DIAGNOSIS — I5033 Acute on chronic diastolic (congestive) heart failure: Secondary | ICD-10-CM | POA: Diagnosis not present

## 2017-03-31 DIAGNOSIS — I110 Hypertensive heart disease with heart failure: Secondary | ICD-10-CM | POA: Diagnosis not present

## 2017-03-31 DIAGNOSIS — G894 Chronic pain syndrome: Secondary | ICD-10-CM | POA: Diagnosis not present

## 2017-03-31 DIAGNOSIS — I2510 Atherosclerotic heart disease of native coronary artery without angina pectoris: Secondary | ICD-10-CM | POA: Diagnosis not present

## 2017-03-31 DIAGNOSIS — E1169 Type 2 diabetes mellitus with other specified complication: Secondary | ICD-10-CM | POA: Diagnosis not present

## 2017-03-31 DIAGNOSIS — J9611 Chronic respiratory failure with hypoxia: Secondary | ICD-10-CM | POA: Diagnosis not present

## 2017-03-31 DIAGNOSIS — G2581 Restless legs syndrome: Secondary | ICD-10-CM | POA: Diagnosis not present

## 2017-03-31 DIAGNOSIS — G4733 Obstructive sleep apnea (adult) (pediatric): Secondary | ICD-10-CM | POA: Diagnosis not present

## 2017-03-31 DIAGNOSIS — N390 Urinary tract infection, site not specified: Secondary | ICD-10-CM | POA: Diagnosis not present

## 2017-03-31 DIAGNOSIS — I480 Paroxysmal atrial fibrillation: Secondary | ICD-10-CM | POA: Diagnosis not present

## 2017-03-31 NOTE — Telephone Encounter (Signed)
Patient was seen 9/12- this encounter will be closed.

## 2017-03-31 NOTE — Telephone Encounter (Signed)
Yes go ahead and have him bring in a urine sample

## 2017-03-31 NOTE — Telephone Encounter (Signed)
Son aware per dpr.

## 2017-03-31 NOTE — Telephone Encounter (Signed)
What are her blood sugars running and is she having any urinary tract symptoms, I know we treated her for infection on the past, if there is any question about urinary tract symptoms that have her come in and be seen

## 2017-03-31 NOTE — Telephone Encounter (Signed)
Spoke with patient's son, Casimiro Needle.  Her blood sugars have been running in normal range for her 112 - 250 after eating, has had a couple of low ones, 73 & 50.  She has been complaining of dysuria in the last 2 days.  He would be glad to bring her in, but he has to start prepping for a colonoscopy this afternoon and the procedure is tomorrow.  He would not be able to get her here until Friday.  Can he bring in a urine sample this time instead of bringing her in?

## 2017-04-01 ENCOUNTER — Telehealth: Payer: Self-pay | Admitting: Family Medicine

## 2017-04-01 DIAGNOSIS — G4733 Obstructive sleep apnea (adult) (pediatric): Secondary | ICD-10-CM | POA: Diagnosis not present

## 2017-04-01 DIAGNOSIS — R2681 Unsteadiness on feet: Secondary | ICD-10-CM | POA: Diagnosis not present

## 2017-04-01 DIAGNOSIS — M1990 Unspecified osteoarthritis, unspecified site: Secondary | ICD-10-CM | POA: Diagnosis not present

## 2017-04-01 DIAGNOSIS — R9431 Abnormal electrocardiogram [ECG] [EKG]: Secondary | ICD-10-CM | POA: Diagnosis not present

## 2017-04-01 DIAGNOSIS — I959 Hypotension, unspecified: Secondary | ICD-10-CM | POA: Diagnosis not present

## 2017-04-01 DIAGNOSIS — I2510 Atherosclerotic heart disease of native coronary artery without angina pectoris: Secondary | ICD-10-CM | POA: Diagnosis not present

## 2017-04-01 DIAGNOSIS — E039 Hypothyroidism, unspecified: Secondary | ICD-10-CM | POA: Diagnosis not present

## 2017-04-01 DIAGNOSIS — R638 Other symptoms and signs concerning food and fluid intake: Secondary | ICD-10-CM | POA: Diagnosis not present

## 2017-04-01 DIAGNOSIS — I110 Hypertensive heart disease with heart failure: Secondary | ICD-10-CM | POA: Diagnosis not present

## 2017-04-01 DIAGNOSIS — I4891 Unspecified atrial fibrillation: Secondary | ICD-10-CM | POA: Diagnosis not present

## 2017-04-01 DIAGNOSIS — R079 Chest pain, unspecified: Secondary | ICD-10-CM | POA: Diagnosis not present

## 2017-04-01 DIAGNOSIS — Z9114 Patient's other noncompliance with medication regimen: Secondary | ICD-10-CM | POA: Diagnosis not present

## 2017-04-01 DIAGNOSIS — J9611 Chronic respiratory failure with hypoxia: Secondary | ICD-10-CM | POA: Diagnosis not present

## 2017-04-01 DIAGNOSIS — I509 Heart failure, unspecified: Secondary | ICD-10-CM | POA: Diagnosis not present

## 2017-04-01 DIAGNOSIS — I479 Paroxysmal tachycardia, unspecified: Secondary | ICD-10-CM | POA: Diagnosis not present

## 2017-04-01 DIAGNOSIS — K5909 Other constipation: Secondary | ICD-10-CM | POA: Diagnosis not present

## 2017-04-01 DIAGNOSIS — R5381 Other malaise: Secondary | ICD-10-CM | POA: Diagnosis not present

## 2017-04-01 DIAGNOSIS — R3915 Urgency of urination: Secondary | ICD-10-CM | POA: Diagnosis not present

## 2017-04-01 DIAGNOSIS — R0602 Shortness of breath: Secondary | ICD-10-CM | POA: Diagnosis not present

## 2017-04-01 DIAGNOSIS — G2581 Restless legs syndrome: Secondary | ICD-10-CM | POA: Diagnosis not present

## 2017-04-01 DIAGNOSIS — G8929 Other chronic pain: Secondary | ICD-10-CM | POA: Diagnosis not present

## 2017-04-01 DIAGNOSIS — I5033 Acute on chronic diastolic (congestive) heart failure: Secondary | ICD-10-CM | POA: Diagnosis not present

## 2017-04-01 DIAGNOSIS — R300 Dysuria: Secondary | ICD-10-CM | POA: Diagnosis not present

## 2017-04-01 DIAGNOSIS — Z7409 Other reduced mobility: Secondary | ICD-10-CM | POA: Diagnosis not present

## 2017-04-01 DIAGNOSIS — D649 Anemia, unspecified: Secondary | ICD-10-CM | POA: Diagnosis not present

## 2017-04-01 DIAGNOSIS — E119 Type 2 diabetes mellitus without complications: Secondary | ICD-10-CM | POA: Diagnosis not present

## 2017-04-01 DIAGNOSIS — Z8249 Family history of ischemic heart disease and other diseases of the circulatory system: Secondary | ICD-10-CM | POA: Diagnosis not present

## 2017-04-01 DIAGNOSIS — M6281 Muscle weakness (generalized): Secondary | ICD-10-CM | POA: Diagnosis not present

## 2017-04-01 DIAGNOSIS — M7989 Other specified soft tissue disorders: Secondary | ICD-10-CM | POA: Diagnosis not present

## 2017-04-01 DIAGNOSIS — Z7982 Long term (current) use of aspirin: Secondary | ICD-10-CM | POA: Diagnosis not present

## 2017-04-01 DIAGNOSIS — E785 Hyperlipidemia, unspecified: Secondary | ICD-10-CM | POA: Diagnosis not present

## 2017-04-01 DIAGNOSIS — J9612 Chronic respiratory failure with hypercapnia: Secondary | ICD-10-CM | POA: Diagnosis not present

## 2017-04-01 DIAGNOSIS — R5383 Other fatigue: Secondary | ICD-10-CM | POA: Diagnosis not present

## 2017-04-01 DIAGNOSIS — J9602 Acute respiratory failure with hypercapnia: Secondary | ICD-10-CM | POA: Diagnosis not present

## 2017-04-01 DIAGNOSIS — T501X6A Underdosing of loop [high-ceiling] diuretics, initial encounter: Secondary | ICD-10-CM | POA: Diagnosis not present

## 2017-04-01 DIAGNOSIS — K5901 Slow transit constipation: Secondary | ICD-10-CM | POA: Diagnosis not present

## 2017-04-01 DIAGNOSIS — I480 Paroxysmal atrial fibrillation: Secondary | ICD-10-CM | POA: Diagnosis not present

## 2017-04-01 DIAGNOSIS — R350 Frequency of micturition: Secondary | ICD-10-CM | POA: Diagnosis not present

## 2017-04-01 DIAGNOSIS — I471 Supraventricular tachycardia: Secondary | ICD-10-CM | POA: Diagnosis not present

## 2017-04-01 DIAGNOSIS — G4700 Insomnia, unspecified: Secondary | ICD-10-CM | POA: Diagnosis not present

## 2017-04-01 DIAGNOSIS — Z91128 Patient's intentional underdosing of medication regimen for other reason: Secondary | ICD-10-CM | POA: Diagnosis not present

## 2017-04-01 DIAGNOSIS — R001 Bradycardia, unspecified: Secondary | ICD-10-CM | POA: Diagnosis not present

## 2017-04-01 DIAGNOSIS — J90 Pleural effusion, not elsewhere classified: Secondary | ICD-10-CM | POA: Diagnosis not present

## 2017-04-01 NOTE — Telephone Encounter (Signed)
Spoke with patient's son Casimiro Needle, he said he had not brought a urine sample in for her yet.  He is planning to bring this tomorrow.  He has been doing colonoscopy prep and had colonoscopy today.  However, he said she is SOB and has admitted to him she has not been taking her fluid pill, so he is planning to call 911 and have her transported to the hospital.

## 2017-04-01 NOTE — Telephone Encounter (Signed)
Do you know anything about this? If don't see an order for urine CX or dip from yesterday?

## 2017-04-02 DIAGNOSIS — R079 Chest pain, unspecified: Secondary | ICD-10-CM | POA: Diagnosis not present

## 2017-04-02 DIAGNOSIS — R001 Bradycardia, unspecified: Secondary | ICD-10-CM | POA: Diagnosis not present

## 2017-04-02 DIAGNOSIS — R9431 Abnormal electrocardiogram [ECG] [EKG]: Secondary | ICD-10-CM | POA: Diagnosis not present

## 2017-04-08 DIAGNOSIS — E119 Type 2 diabetes mellitus without complications: Secondary | ICD-10-CM | POA: Diagnosis not present

## 2017-04-08 DIAGNOSIS — Z7409 Other reduced mobility: Secondary | ICD-10-CM | POA: Diagnosis not present

## 2017-04-08 DIAGNOSIS — I5032 Chronic diastolic (congestive) heart failure: Secondary | ICD-10-CM | POA: Diagnosis not present

## 2017-04-08 DIAGNOSIS — E278 Other specified disorders of adrenal gland: Secondary | ICD-10-CM | POA: Diagnosis not present

## 2017-04-08 DIAGNOSIS — I10 Essential (primary) hypertension: Secondary | ICD-10-CM | POA: Diagnosis not present

## 2017-04-08 DIAGNOSIS — Z5189 Encounter for other specified aftercare: Secondary | ICD-10-CM | POA: Diagnosis not present

## 2017-04-08 DIAGNOSIS — I480 Paroxysmal atrial fibrillation: Secondary | ICD-10-CM | POA: Diagnosis not present

## 2017-04-08 DIAGNOSIS — M6281 Muscle weakness (generalized): Secondary | ICD-10-CM | POA: Diagnosis not present

## 2017-04-08 DIAGNOSIS — M549 Dorsalgia, unspecified: Secondary | ICD-10-CM | POA: Diagnosis not present

## 2017-04-08 DIAGNOSIS — R2681 Unsteadiness on feet: Secondary | ICD-10-CM | POA: Diagnosis not present

## 2017-04-08 DIAGNOSIS — R3915 Urgency of urination: Secondary | ICD-10-CM | POA: Diagnosis not present

## 2017-04-08 DIAGNOSIS — I509 Heart failure, unspecified: Secondary | ICD-10-CM | POA: Diagnosis not present

## 2017-04-08 DIAGNOSIS — Z7901 Long term (current) use of anticoagulants: Secondary | ICD-10-CM | POA: Diagnosis not present

## 2017-04-08 DIAGNOSIS — I5033 Acute on chronic diastolic (congestive) heart failure: Secondary | ICD-10-CM | POA: Diagnosis not present

## 2017-04-08 DIAGNOSIS — I479 Paroxysmal tachycardia, unspecified: Secondary | ICD-10-CM | POA: Diagnosis not present

## 2017-04-08 DIAGNOSIS — T501X6A Underdosing of loop [high-ceiling] diuretics, initial encounter: Secondary | ICD-10-CM | POA: Diagnosis not present

## 2017-04-08 DIAGNOSIS — N390 Urinary tract infection, site not specified: Secondary | ICD-10-CM | POA: Diagnosis not present

## 2017-04-08 DIAGNOSIS — J9611 Chronic respiratory failure with hypoxia: Secondary | ICD-10-CM | POA: Diagnosis not present

## 2017-04-08 DIAGNOSIS — Z91128 Patient's intentional underdosing of medication regimen for other reason: Secondary | ICD-10-CM | POA: Diagnosis not present

## 2017-04-08 DIAGNOSIS — I110 Hypertensive heart disease with heart failure: Secondary | ICD-10-CM | POA: Diagnosis not present

## 2017-04-08 DIAGNOSIS — E1165 Type 2 diabetes mellitus with hyperglycemia: Secondary | ICD-10-CM | POA: Diagnosis not present

## 2017-04-08 DIAGNOSIS — K5901 Slow transit constipation: Secondary | ICD-10-CM | POA: Diagnosis not present

## 2017-04-09 DIAGNOSIS — Z5189 Encounter for other specified aftercare: Secondary | ICD-10-CM | POA: Diagnosis not present

## 2017-04-09 DIAGNOSIS — I5032 Chronic diastolic (congestive) heart failure: Secondary | ICD-10-CM | POA: Diagnosis not present

## 2017-04-09 DIAGNOSIS — I480 Paroxysmal atrial fibrillation: Secondary | ICD-10-CM | POA: Diagnosis not present

## 2017-04-09 DIAGNOSIS — Z7901 Long term (current) use of anticoagulants: Secondary | ICD-10-CM | POA: Diagnosis not present

## 2017-04-18 ENCOUNTER — Other Ambulatory Visit: Payer: Self-pay | Admitting: Family Medicine

## 2017-04-18 DIAGNOSIS — I10 Essential (primary) hypertension: Secondary | ICD-10-CM

## 2017-04-23 DIAGNOSIS — Z7901 Long term (current) use of anticoagulants: Secondary | ICD-10-CM | POA: Diagnosis not present

## 2017-04-23 DIAGNOSIS — E1165 Type 2 diabetes mellitus with hyperglycemia: Secondary | ICD-10-CM | POA: Diagnosis not present

## 2017-04-23 DIAGNOSIS — N390 Urinary tract infection, site not specified: Secondary | ICD-10-CM | POA: Diagnosis not present

## 2017-04-24 DIAGNOSIS — M549 Dorsalgia, unspecified: Secondary | ICD-10-CM | POA: Diagnosis not present

## 2017-04-24 DIAGNOSIS — I480 Paroxysmal atrial fibrillation: Secondary | ICD-10-CM | POA: Diagnosis not present

## 2017-04-24 DIAGNOSIS — I5032 Chronic diastolic (congestive) heart failure: Secondary | ICD-10-CM | POA: Diagnosis not present

## 2017-04-24 DIAGNOSIS — E1165 Type 2 diabetes mellitus with hyperglycemia: Secondary | ICD-10-CM | POA: Diagnosis not present

## 2017-05-06 DIAGNOSIS — J9612 Chronic respiratory failure with hypercapnia: Secondary | ICD-10-CM | POA: Diagnosis not present

## 2017-05-06 DIAGNOSIS — I4891 Unspecified atrial fibrillation: Secondary | ICD-10-CM | POA: Diagnosis not present

## 2017-05-13 DEATH — deceased

## 2017-06-27 NOTE — Progress Notes (Deleted)
Cardiology Office Note   Date:  06/27/2017   ID:  [name], DOB [birth year], MRN [id number]  PCP:  Ernestina PennaMoore, Donald W, MD  Cardiologist:   Rollene RotundaJames Larie Mathes, MD   No chief complaint on file.     History of Present Illness: [name] is a 76 y.o. female who presents for atrial fibrillation and diastolic dysfunction. She is morbidly obese and gets around in a wheelchair predominantly though she can transfer from chair to bed. She has chronic oxygen. She has sleep apnea she doesn't use CPAP.   She is on chronic anticoagulation. Since I last saw her ***    she has had some palpitations.   She does not have any acute shortness of breath though she's chronically dyspneic. She does have any PND or orthopnea. She has no edema.  [facility]  Told her to take an extra sotalol if she thought she was having fibrillation.   She thinks she's had to do this a couple of times since I saw her.   Past Medical History:  Diagnosis Date  . Allergic rhinitis   . Back pain   . CAD (coronary artery disease)   . Chronic pain syndrome   . Dementia   . Diabetes mellitus    type 2  . Fibromyalgia   . Hyperlipemia   . Hypothyroidism   . Morbid obesity (HCC)   . 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 Narcolepsy   . Sleep apnea   . SVT (supraventricular tachycardia) (HCC)    afib    Past Surgical History:  Procedure Laterality Date  . APPENDECTOMY    . TOTAL ABDOMINAL HYSTERECTOMY    . TUBAL LIGATION       Current Outpatient Medications  Medication Sig Dispense Refill  . acetaminophen (TYLENOL) 325 MG tablet Take 325 mg by mouth every 6 (six) hours as needed.    Marland Kitchen. aspirin EC 81 MG tablet Take 81 mg by mouth.    . calcium-vitamin D (OSCAL WITH D) 500-200 MG-UNIT tablet Take 1 tablet by mouth daily with breakfast.    . cefdinir (OMNICEF) 300 MG capsule Take 1 capsule (300 mg total) by mouth 2 (two) times daily. 1 po BID 20 capsule 0  .  cholecalciferol (VITAMIN D) 1000 units tablet Take 1,000 Units by mouth daily.    . clopidogrel (PLAVIX) 75 MG tablet TAKE 1 TABLET BY MOUTH EVERY DAY 30 tablet 2  . donepezil (ARICEPT) 5 MG tablet TAKE 1 TABLET (5 MG TOTAL) BY MOUTH AT BEDTIME. 30 tablet 0  . DULoxetine (CYMBALTA) 30 MG capsule Take 3 capsules (90 mg total) by mouth daily. 270 capsule 0  . ferrous gluconate (FERGON) 324 MG tablet Take 324 mg by mouth 2 (two) times daily with a meal.    . furosemide (LASIX) 20 MG tablet TAKE 1 TABLET BY MOUTH EVERY DAY 90 tablet 1  . glucose blood (ONE TOUCH ULTRA TEST) test strip Test 4X a day and prn  Dx 250.02 100 each 12  . insulin detemir (LEVEMIR) 100 UNIT/ML injection Inject 22 Units into the skin 2 (two) times daily.     . Insulin Syringe-Needle U-100 (ADVOCATE INSULIN SYRINGE) 31G X 5/16" 0.3 ML MISC 1 each by Does not apply route 5 (five) times daily. 150 each 11  . isosorbide mononitrate (IMDUR) 30 MG 24 hr tablet TAKE 1 TABLET BY MOUTH EVERY OTHER DAY 45 tablet 1  .  LEVEMIR FLEXTOUCH 100 UNIT/ML Pen INJECT 20 UNITS SUBCUTANEOUSLY TWICE A DAY 15 pen 1  . levothyroxine (SYNTHROID, LEVOTHROID) 75 MCG tablet TAKE 1 TABLET BY MOUTH EVERY DAY 90 tablet 1  . metoprolol tartrate (LOPRESSOR) 50 MG tablet Take 1 tablet (50 mg total) by mouth 2 (two) times daily. 180 tablet 1  . nitroGLYCERIN (NITROSTAT) 0.4 MG SL tablet USE AS DIRECTED 25 tablet 4  . nystatin (NYSTATIN) powder USE AS DIRECTED 60 g 6  . oxyCODONE (OXYCONTIN) 10 mg 12 hr tablet Take 10 mg by mouth every 12 (twelve) hours.    . polyethylene glycol (MIRALAX / GLYCOLAX) packet Take 17 g by mouth daily as needed.     . potassium chloride (K-DUR,KLOR-CON) 10 MEQ tablet Take 10 mEq by mouth daily.    . potassium chloride (MICRO-K) 10 MEQ CR capsule TAKE ONE CAPSULE BY MOUTH EVERY DAY 90 capsule 1  . pravastatin (PRAVACHOL) 80 MG tablet TAKE 1 TABLET BY MOUTH EVERY DAY 90 tablet 1  . senna (SENOKOT) 8.6 MG TABS tablet Take 1 tablet by  mouth 2 (two) times daily.    . traZODone (DESYREL) 50 MG tablet Take 0.5-1 tablets (25-50 mg total) by mouth at bedtime as needed for sleep. 30 tablet 3  . XARELTO 20 MG TABS tablet TAKE 1 TABLET BY MOUTH EVERY DAY 30 tablet 2   No current facility-administered medications for this visit.     Allergies:   Amitriptyline; Codeine; Crestor [rosuvastatin]; Detrol [tolterodine]; Lipitor [atorvastatin]; Lisinopril; Penicillins; Seroquel [quetiapine fumarate]; and Adhesive  [tape]     ROS:  Please see the history of present illness.   Otherwise, review of systems are positive for ***.   All other systems are reviewed and negative.    PHYSICAL EXAM: VS:  There were no vitals taken for this visit. , BMI There is no height or weight on file to calculate BMI.  PHYSICAL EXAM GEN:  No distress NECK:  No jugular venous distention at 90 degrees, waveform within normal limits, carotid upstroke brisk and symmetric, no bruits, no thyromegaly LYMPHATICS:  No cervical adenopathy LUNGS:  Clear to auscultation bilaterally BACK:  No CVA tenderness CHEST:  Unremarkable HEART:  S1 and S2 within normal limits, no S3, no S4, no clicks, no rubs, *** murmurs ABD:  Positive bowel sounds normal in frequency in pitch, no bruits, no rebound, no guarding, unable to assess midline mass or bruit with the patient seated. EXT:  2 plus pulses throughout, moderate edema, no cyanosis no clubbing SKIN:  No rashes no nodules NEURO:  Cranial nerves II through XII grossly intact, motor grossly intact throughout PSYCH:  Cognitively intact, oriented to person place and time    PHYSICAL EXAM GEN:  No distress NECK:  No jugular venous distention at 90 degrees, waveform within normal limits, carotid upstroke brisk and symmetric, no bruits, no thyromegaly LYMPHATICS:  No cervical adenopathy LUNGS:  Clear to auscultation bilaterally BACK:  No CVA tenderness CHEST:  Unremarkable HEART:  S1 and S2 within normal limits, no S3, no  S4, no clicks, no rubs, no murmurs ABD:  Positive bowel sounds normal in frequency in pitch, no bruits, no rebound, no guarding, unable to assess midline mass or bruit with the patient seated. EXT:  2 plus pulses throughout, no edema, no cyanosis no clubbing SKIN:  No rashes no nodules NEURO:  Cranial nerves II through XII grossly intact, motor grossly intact throughout PSYCH:  Cognitively intact, oriented to person place and time  EKG:  EKG is ***  ordered today. The ekg ordered today demonstrates Sinus rhythm, rate ***, axis within normal limits, intervals within normal limits, no acute ST-T wave changes.   Recent Labs: 10/26/2016: ALT 15; BUN 22; Creatinine, Ser 0.77; Hemoglobin 12.0; Platelets 197; Potassium 4.6; Sodium 139; TSH 1.810     Wt Readings from Last 3 Encounters:  12/21/16 282 lb (127.9 kg)  11/23/16 282 lb (127.9 kg)  10/31/15 275 lb (124.7 kg)      Other studies Reviewed: Additional studies/ records that were reviewed today include: *** Review of the above records demonstrates: ***   ASSESSMENT AND PLAN:  ATRIAL FIB:   Ms. [name] has a CHA2DS2 - VASc score of 4.  ***  .  She might have had some paroxysms. However, these are brief. I do not want her taking extra sotalol but will give her prescription for when necessary beta blocker should she have any palpitations. She will otherwise remain on the meds as listed including anticoagulation.  DIASTOLIC DYSFUNCTION:  ***  She eats a lot of canned food.   We discussed the high salt content. She's not had any overt volume overload however. She will remain on the meds as listed.   Current medicines are reviewed at length with the patient today.  The patient does not have concerns regarding medicines.  The following changes have been made:   ***  Labs/ tests ordered today include: *** No orders of the defined types were placed in this encounter.    Disposition:   FU with me ***  12 months with me.       Signed, Rollene RotundaJames Christpoher Sievers, MD  06/27/2017 6:28 PM    [facility]

## 2017-06-29 ENCOUNTER — Telehealth: Payer: Self-pay | Admitting: Cardiology

## 2017-06-29 NOTE — Telephone Encounter (Signed)
Wanted you to know pt died on 11-22-2016.

## 2017-06-30 ENCOUNTER — Ambulatory Visit: Payer: Medicare Other | Admitting: Cardiology
